# Patient Record
Sex: Male | Born: 1935 | Race: White | Hispanic: No | Marital: Married | Smoking: Former smoker
Health system: Southern US, Community
[De-identification: ages and names within clinical notes are randomized; demographics above are authoritative.]

## PROBLEM LIST (undated history)

## (undated) DIAGNOSIS — R0602 Shortness of breath: Secondary | ICD-10-CM

## (undated) DIAGNOSIS — J309 Allergic rhinitis, unspecified: Secondary | ICD-10-CM

## (undated) DIAGNOSIS — F068 Other specified mental disorders due to known physiological condition: Secondary | ICD-10-CM

## (undated) DIAGNOSIS — K4090 Unilateral inguinal hernia, without obstruction or gangrene, not specified as recurrent: Secondary | ICD-10-CM

## (undated) DIAGNOSIS — I10 Essential (primary) hypertension: Secondary | ICD-10-CM

## (undated) DIAGNOSIS — R5381 Other malaise: Secondary | ICD-10-CM

## (undated) DIAGNOSIS — Z9889 Other specified postprocedural states: Secondary | ICD-10-CM

## (undated) DIAGNOSIS — G4733 Obstructive sleep apnea (adult) (pediatric): Secondary | ICD-10-CM

## (undated) DIAGNOSIS — E7800 Pure hypercholesterolemia, unspecified: Secondary | ICD-10-CM

## (undated) DIAGNOSIS — R222 Localized swelling, mass and lump, trunk: Secondary | ICD-10-CM

## (undated) DIAGNOSIS — J449 Chronic obstructive pulmonary disease, unspecified: Secondary | ICD-10-CM

## (undated) DIAGNOSIS — I2510 Atherosclerotic heart disease of native coronary artery without angina pectoris: Secondary | ICD-10-CM

## (undated) DIAGNOSIS — J9610 Chronic respiratory failure, unspecified whether with hypoxia or hypercapnia: Secondary | ICD-10-CM

## (undated) DIAGNOSIS — R5383 Other fatigue: Secondary | ICD-10-CM

## (undated) DIAGNOSIS — L988 Other specified disorders of the skin and subcutaneous tissue: Secondary | ICD-10-CM

## (undated) DIAGNOSIS — R001 Bradycardia, unspecified: Secondary | ICD-10-CM

## (undated) DIAGNOSIS — I868 Varicose veins of other specified sites: Secondary | ICD-10-CM

## (undated) HISTORY — DX: Bradycardia, unspecified: R00.1

## (undated) HISTORY — DX: Chronic respiratory failure, unspecified whether with hypoxia or hypercapnia: J96.10

## (undated) HISTORY — DX: Localized swelling, mass and lump, trunk: R22.2

## (undated) HISTORY — DX: Atherosclerotic heart disease of native coronary artery without angina pectoris: I25.10

## (undated) HISTORY — DX: Other specified postprocedural states: Z98.890

## (undated) HISTORY — DX: Varicose veins of other specified sites: I86.8

## (undated) HISTORY — DX: Obstructive sleep apnea (adult) (pediatric): G47.33

## (undated) HISTORY — DX: Chronic obstructive pulmonary disease, unspecified: J44.9

## (undated) HISTORY — PX: CHOLECYSTECTOMY: SHX55

## (undated) HISTORY — DX: Unilateral inguinal hernia, without obstruction or gangrene, not specified as recurrent: K40.90

## (undated) HISTORY — DX: Essential (primary) hypertension: I10

## (undated) HISTORY — DX: Other specified disorders of the skin and subcutaneous tissue: L98.8

## (undated) HISTORY — DX: Other malaise: R53.81

## (undated) HISTORY — DX: Allergic rhinitis, unspecified: J30.9

## (undated) HISTORY — DX: Other fatigue: R53.83

## (undated) HISTORY — DX: Other specified mental disorders due to known physiological condition: F06.8

## (undated) HISTORY — DX: Shortness of breath: R06.02

## (undated) HISTORY — DX: Pure hypercholesterolemia, unspecified: E78.00

---

## 1998-04-14 HISTORY — PX: HERNIA REPAIR: SHX51

## 1998-05-01 ENCOUNTER — Ambulatory Visit (HOSPITAL_BASED_OUTPATIENT_CLINIC_OR_DEPARTMENT_OTHER): Admission: RE | Admit: 1998-05-01 | Discharge: 1998-05-01 | Payer: Self-pay | Admitting: Surgery

## 1999-07-14 ENCOUNTER — Encounter: Payer: Self-pay | Admitting: Family Medicine

## 1999-07-14 LAB — CONVERTED CEMR LAB: PSA: 0.9 ng/mL

## 1999-12-14 ENCOUNTER — Encounter: Payer: Self-pay | Admitting: Family Medicine

## 1999-12-14 LAB — CONVERTED CEMR LAB: Hgb A1c MFr Bld: 9.9 %

## 2000-11-12 ENCOUNTER — Encounter: Payer: Self-pay | Admitting: Family Medicine

## 2000-11-12 LAB — CONVERTED CEMR LAB: Hgb A1c MFr Bld: 5 %

## 2001-04-14 ENCOUNTER — Encounter: Payer: Self-pay | Admitting: Family Medicine

## 2001-04-14 LAB — CONVERTED CEMR LAB
Hgb A1c MFr Bld: 5.2 %
Microalbumin U total vol: 3 mg/L
PSA: 0.5 ng/mL

## 2002-05-15 ENCOUNTER — Encounter: Payer: Self-pay | Admitting: Family Medicine

## 2002-05-15 LAB — CONVERTED CEMR LAB
Hgb A1c MFr Bld: 5.4 %
Microalbumin U total vol: 7.3 mg/L
PSA: 0.7 ng/mL

## 2002-11-13 ENCOUNTER — Encounter: Payer: Self-pay | Admitting: Family Medicine

## 2002-11-13 LAB — CONVERTED CEMR LAB: Hgb A1c MFr Bld: 5.7 %

## 2003-01-26 ENCOUNTER — Inpatient Hospital Stay (HOSPITAL_COMMUNITY): Admission: EM | Admit: 2003-01-26 | Discharge: 2003-01-31 | Payer: Self-pay | Admitting: Emergency Medicine

## 2003-01-26 ENCOUNTER — Encounter: Payer: Self-pay | Admitting: Emergency Medicine

## 2003-01-27 ENCOUNTER — Encounter (INDEPENDENT_AMBULATORY_CARE_PROVIDER_SITE_OTHER): Payer: Self-pay | Admitting: *Deleted

## 2003-01-27 ENCOUNTER — Encounter: Payer: Self-pay | Admitting: *Deleted

## 2003-01-27 ENCOUNTER — Encounter: Payer: Self-pay | Admitting: General Surgery

## 2003-05-14 ENCOUNTER — Ambulatory Visit: Payer: Self-pay | Admitting: Cardiology

## 2003-05-16 ENCOUNTER — Encounter: Payer: Self-pay | Admitting: Family Medicine

## 2003-05-16 LAB — CONVERTED CEMR LAB
Hgb A1c MFr Bld: 5 %
Microalbumin U total vol: 5.1 mg/L
PSA: 0.4 ng/mL

## 2003-11-13 ENCOUNTER — Encounter: Payer: Self-pay | Admitting: Family Medicine

## 2003-11-13 LAB — CONVERTED CEMR LAB: Hgb A1c MFr Bld: 5.9 %

## 2004-02-27 ENCOUNTER — Encounter: Admission: RE | Admit: 2004-02-27 | Discharge: 2004-02-27 | Payer: Self-pay | Admitting: Orthopedic Surgery

## 2004-03-11 ENCOUNTER — Ambulatory Visit: Payer: Self-pay | Admitting: *Deleted

## 2004-03-12 ENCOUNTER — Encounter: Admission: RE | Admit: 2004-03-12 | Discharge: 2004-03-12 | Payer: Self-pay | Admitting: Orthopedic Surgery

## 2004-03-25 ENCOUNTER — Ambulatory Visit: Payer: Self-pay

## 2004-03-25 HISTORY — PX: OTHER SURGICAL HISTORY: SHX169

## 2004-04-02 ENCOUNTER — Ambulatory Visit: Payer: Self-pay | Admitting: Internal Medicine

## 2004-04-04 ENCOUNTER — Ambulatory Visit: Payer: Self-pay | Admitting: Internal Medicine

## 2004-04-09 ENCOUNTER — Ambulatory Visit (HOSPITAL_COMMUNITY): Admission: RE | Admit: 2004-04-09 | Discharge: 2004-04-10 | Payer: Self-pay | Admitting: Cardiology

## 2004-04-09 ENCOUNTER — Ambulatory Visit: Payer: Self-pay | Admitting: Cardiology

## 2004-04-14 ENCOUNTER — Encounter: Payer: Self-pay | Admitting: Family Medicine

## 2004-04-14 LAB — CONVERTED CEMR LAB
Hgb A1c MFr Bld: 5.7 %
Microalbumin U total vol: 5.6 mg/L
PSA: 0.61 ng/mL

## 2004-04-29 ENCOUNTER — Ambulatory Visit: Payer: Self-pay | Admitting: Family Medicine

## 2004-05-01 ENCOUNTER — Ambulatory Visit: Payer: Self-pay | Admitting: Family Medicine

## 2004-05-03 ENCOUNTER — Ambulatory Visit: Payer: Self-pay | Admitting: Internal Medicine

## 2004-05-15 ENCOUNTER — Ambulatory Visit: Payer: Self-pay | Admitting: Family Medicine

## 2004-05-15 HISTORY — PX: TOTAL HIP ARTHROPLASTY: SHX124

## 2004-05-20 ENCOUNTER — Inpatient Hospital Stay (HOSPITAL_COMMUNITY): Admission: RE | Admit: 2004-05-20 | Discharge: 2004-05-23 | Payer: Self-pay | Admitting: Orthopedic Surgery

## 2004-05-20 ENCOUNTER — Ambulatory Visit: Payer: Self-pay | Admitting: Physical Medicine & Rehabilitation

## 2004-05-20 ENCOUNTER — Ambulatory Visit: Payer: Self-pay | Admitting: Cardiology

## 2004-05-23 ENCOUNTER — Inpatient Hospital Stay
Admission: RE | Admit: 2004-05-23 | Discharge: 2004-05-29 | Payer: Self-pay | Admitting: Physical Medicine & Rehabilitation

## 2004-06-03 ENCOUNTER — Ambulatory Visit: Payer: Self-pay | Admitting: Internal Medicine

## 2004-06-03 ENCOUNTER — Inpatient Hospital Stay (HOSPITAL_COMMUNITY): Admission: EM | Admit: 2004-06-03 | Discharge: 2004-06-06 | Payer: Self-pay | Admitting: *Deleted

## 2004-06-03 DIAGNOSIS — Z9889 Other specified postprocedural states: Secondary | ICD-10-CM

## 2004-06-03 HISTORY — DX: Other specified postprocedural states: Z98.890

## 2004-06-11 ENCOUNTER — Ambulatory Visit: Payer: Self-pay | Admitting: Family Medicine

## 2004-06-18 ENCOUNTER — Ambulatory Visit: Payer: Self-pay | Admitting: Family Medicine

## 2004-06-25 ENCOUNTER — Ambulatory Visit: Payer: Self-pay | Admitting: Internal Medicine

## 2004-09-16 ENCOUNTER — Ambulatory Visit: Payer: Self-pay | Admitting: Internal Medicine

## 2004-09-24 ENCOUNTER — Ambulatory Visit: Payer: Self-pay | Admitting: Internal Medicine

## 2004-11-11 ENCOUNTER — Ambulatory Visit: Payer: Self-pay | Admitting: Family Medicine

## 2005-01-08 ENCOUNTER — Ambulatory Visit: Payer: Self-pay | Admitting: Family Medicine

## 2005-01-14 ENCOUNTER — Ambulatory Visit: Payer: Self-pay | Admitting: Internal Medicine

## 2005-01-28 ENCOUNTER — Ambulatory Visit: Payer: Self-pay | Admitting: Family Medicine

## 2005-04-14 ENCOUNTER — Encounter: Payer: Self-pay | Admitting: Family Medicine

## 2005-04-14 LAB — CONVERTED CEMR LAB: Hgb A1c MFr Bld: 6 %

## 2005-05-08 ENCOUNTER — Ambulatory Visit: Payer: Self-pay | Admitting: Family Medicine

## 2005-05-12 ENCOUNTER — Ambulatory Visit: Payer: Self-pay | Admitting: Family Medicine

## 2005-05-26 ENCOUNTER — Inpatient Hospital Stay (HOSPITAL_COMMUNITY): Admission: RE | Admit: 2005-05-26 | Discharge: 2005-05-30 | Payer: Self-pay | Admitting: Orthopedic Surgery

## 2005-05-26 ENCOUNTER — Ambulatory Visit: Payer: Self-pay | Admitting: Cardiology

## 2005-05-26 ENCOUNTER — Ambulatory Visit: Payer: Self-pay | Admitting: Physical Medicine & Rehabilitation

## 2005-05-28 ENCOUNTER — Ambulatory Visit: Payer: Self-pay | Admitting: Family Medicine

## 2005-05-30 ENCOUNTER — Inpatient Hospital Stay (HOSPITAL_COMMUNITY)
Admission: RE | Admit: 2005-05-30 | Discharge: 2005-06-06 | Payer: Self-pay | Admitting: Physical Medicine & Rehabilitation

## 2005-05-31 HISTORY — PX: TOTAL HIP ARTHROPLASTY: SHX124

## 2005-07-25 ENCOUNTER — Ambulatory Visit: Payer: Self-pay | Admitting: Family Medicine

## 2005-08-05 ENCOUNTER — Ambulatory Visit: Payer: Self-pay | Admitting: Internal Medicine

## 2005-08-06 ENCOUNTER — Encounter: Payer: Self-pay | Admitting: Internal Medicine

## 2005-08-06 ENCOUNTER — Ambulatory Visit: Admission: RE | Admit: 2005-08-06 | Discharge: 2005-08-06 | Payer: Self-pay | Admitting: Internal Medicine

## 2005-08-13 ENCOUNTER — Ambulatory Visit: Payer: Self-pay | Admitting: Internal Medicine

## 2005-08-13 ENCOUNTER — Ambulatory Visit: Payer: Self-pay

## 2005-08-13 HISTORY — PX: OTHER SURGICAL HISTORY: SHX169

## 2005-10-09 ENCOUNTER — Ambulatory Visit: Payer: Self-pay | Admitting: Internal Medicine

## 2005-12-03 ENCOUNTER — Ambulatory Visit: Payer: Self-pay | Admitting: Family Medicine

## 2006-01-13 ENCOUNTER — Ambulatory Visit: Payer: Self-pay | Admitting: Family Medicine

## 2006-03-20 ENCOUNTER — Ambulatory Visit: Payer: Self-pay | Admitting: Family Medicine

## 2006-03-23 ENCOUNTER — Ambulatory Visit: Payer: Self-pay | Admitting: Family Medicine

## 2006-04-29 ENCOUNTER — Ambulatory Visit: Payer: Self-pay | Admitting: Family Medicine

## 2006-04-29 LAB — CONVERTED CEMR LAB
ALT: 16 units/L (ref 0–40)
AST: 19 units/L (ref 0–37)
Albumin: 3.8 g/dL (ref 3.5–5.2)
Alkaline Phosphatase: 88 units/L (ref 39–117)
BUN: 12 mg/dL (ref 6–23)
CO2: 29 meq/L (ref 19–32)
Calcium: 9.3 mg/dL (ref 8.4–10.5)
Chloride: 109 meq/L (ref 96–112)
Cholesterol: 117 mg/dL (ref 0–200)
Creatinine, Ser: 1.2 mg/dL (ref 0.4–1.5)
Creatinine,U: 125.9 mg/dL
GFR calc Af Amer: 75 mL/min
GFR calc non Af Amer: 62 mL/min
Glucose, Bld: 125 mg/dL — ABNORMAL HIGH (ref 70–99)
HDL: 40.5 mg/dL (ref >39.0)
Hgb A1c MFr Bld: 6.3 %
Hgb A1c MFr Bld: 6.3 % — ABNORMAL HIGH (ref 4.6–6.0)
LDL Cholesterol: 59 mg/dL (ref 0–99)
Microalb Creat Ratio: 5.6 mg/g (ref 0.0–30.0)
Microalb, Ur: 0.7 mg/dL (ref 0.0–1.9)
Microalbumin U total vol: 5.6 mg/L
PSA: 0.75 ng/mL
PSA: 0.75 ng/mL (ref 0.10–4.00)
Potassium: 4.4 meq/L (ref 3.5–5.1)
Sodium: 143 meq/L (ref 135–145)
TSH: 2.22 microintl units/mL (ref 0.35–5.50)
Total Bilirubin: 0.9 mg/dL (ref 0.3–1.2)
Total CHOL/HDL Ratio: 2.9
Total Protein: 6.6 g/dL (ref 6.0–8.3)
Triglycerides: 87 mg/dL (ref 0–149)
VLDL: 17 mg/dL (ref 0–40)

## 2006-05-01 ENCOUNTER — Ambulatory Visit: Payer: Self-pay | Admitting: Family Medicine

## 2006-05-14 ENCOUNTER — Ambulatory Visit: Payer: Self-pay | Admitting: Family Medicine

## 2006-08-03 ENCOUNTER — Ambulatory Visit: Payer: Self-pay | Admitting: Family Medicine

## 2006-08-03 LAB — CONVERTED CEMR LAB
ALT: 16 units/L (ref 0–40)
AST: 22 units/L (ref 0–37)
Albumin: 3.9 g/dL (ref 3.5–5.2)
Alkaline Phosphatase: 85 units/L (ref 39–117)
BUN: 19 mg/dL (ref 6–23)
Bilirubin, Direct: 0.1 mg/dL (ref 0.0–0.3)
CO2: 28 meq/L (ref 19–32)
Calcium: 9.2 mg/dL (ref 8.4–10.5)
Chloride: 108 meq/L (ref 96–112)
Cholesterol: 127 mg/dL (ref 0–200)
Creatinine, Ser: 1.1 mg/dL (ref 0.4–1.5)
GFR calc Af Amer: 83 mL/min
GFR calc non Af Amer: 69 mL/min
Glucose, Bld: 133 mg/dL — ABNORMAL HIGH (ref 70–99)
HDL: 45.6 mg/dL (ref >39.0)
LDL Cholesterol: 65 mg/dL (ref 0–99)
Potassium: 5.1 meq/L (ref 3.5–5.1)
Sodium: 142 meq/L (ref 135–145)
Total Bilirubin: 0.7 mg/dL (ref 0.3–1.2)
Total CHOL/HDL Ratio: 2.8
Total Protein: 6.7 g/dL (ref 6.0–8.3)
Triglycerides: 84 mg/dL (ref 0–149)
VLDL: 17 mg/dL (ref 0–40)

## 2006-08-12 ENCOUNTER — Ambulatory Visit: Payer: Self-pay

## 2006-08-12 ENCOUNTER — Ambulatory Visit: Payer: Self-pay | Admitting: Internal Medicine

## 2006-09-02 ENCOUNTER — Ambulatory Visit: Payer: Self-pay

## 2006-09-09 ENCOUNTER — Ambulatory Visit: Payer: Self-pay | Admitting: Pulmonary Disease

## 2006-09-09 ENCOUNTER — Ambulatory Visit (HOSPITAL_BASED_OUTPATIENT_CLINIC_OR_DEPARTMENT_OTHER): Admission: RE | Admit: 2006-09-09 | Discharge: 2006-09-09 | Payer: Self-pay | Admitting: Pulmonary Disease

## 2006-09-11 ENCOUNTER — Ambulatory Visit: Payer: Self-pay | Admitting: Family Medicine

## 2006-09-22 ENCOUNTER — Ambulatory Visit: Payer: Self-pay | Admitting: Pulmonary Disease

## 2006-10-07 ENCOUNTER — Ambulatory Visit: Payer: Self-pay | Admitting: Pulmonary Disease

## 2006-10-07 ENCOUNTER — Telehealth (INDEPENDENT_AMBULATORY_CARE_PROVIDER_SITE_OTHER): Payer: Self-pay | Admitting: *Deleted

## 2006-10-08 ENCOUNTER — Ambulatory Visit: Payer: Self-pay | Admitting: Family Medicine

## 2006-10-12 ENCOUNTER — Ambulatory Visit: Payer: Self-pay | Admitting: Internal Medicine

## 2006-10-22 ENCOUNTER — Encounter: Payer: Self-pay | Admitting: Family Medicine

## 2006-10-22 DIAGNOSIS — I10 Essential (primary) hypertension: Secondary | ICD-10-CM | POA: Insufficient documentation

## 2006-10-22 DIAGNOSIS — K649 Unspecified hemorrhoids: Secondary | ICD-10-CM | POA: Insufficient documentation

## 2006-10-22 DIAGNOSIS — E119 Type 2 diabetes mellitus without complications: Secondary | ICD-10-CM | POA: Insufficient documentation

## 2006-10-22 DIAGNOSIS — I2510 Atherosclerotic heart disease of native coronary artery without angina pectoris: Secondary | ICD-10-CM | POA: Insufficient documentation

## 2006-10-26 ENCOUNTER — Ambulatory Visit: Payer: Self-pay | Admitting: Family Medicine

## 2006-10-28 ENCOUNTER — Telehealth (INDEPENDENT_AMBULATORY_CARE_PROVIDER_SITE_OTHER): Payer: Self-pay | Admitting: *Deleted

## 2006-10-28 ENCOUNTER — Ambulatory Visit: Payer: Self-pay | Admitting: Family Medicine

## 2006-11-04 ENCOUNTER — Ambulatory Visit: Payer: Self-pay | Admitting: Pulmonary Disease

## 2006-11-04 ENCOUNTER — Encounter: Payer: Self-pay | Admitting: Pulmonary Disease

## 2007-01-11 ENCOUNTER — Ambulatory Visit: Payer: Self-pay | Admitting: Pulmonary Disease

## 2007-01-12 ENCOUNTER — Ambulatory Visit: Payer: Self-pay | Admitting: Internal Medicine

## 2007-01-13 ENCOUNTER — Ambulatory Visit: Payer: Self-pay | Admitting: Family Medicine

## 2007-02-02 ENCOUNTER — Ambulatory Visit: Payer: Self-pay | Admitting: Family Medicine

## 2007-02-09 ENCOUNTER — Telehealth (INDEPENDENT_AMBULATORY_CARE_PROVIDER_SITE_OTHER): Payer: Self-pay | Admitting: *Deleted

## 2007-03-01 ENCOUNTER — Ambulatory Visit: Payer: Self-pay | Admitting: Pulmonary Disease

## 2007-03-30 ENCOUNTER — Telehealth (INDEPENDENT_AMBULATORY_CARE_PROVIDER_SITE_OTHER): Payer: Self-pay | Admitting: *Deleted

## 2007-04-12 ENCOUNTER — Telehealth (INDEPENDENT_AMBULATORY_CARE_PROVIDER_SITE_OTHER): Payer: Self-pay | Admitting: *Deleted

## 2007-04-27 ENCOUNTER — Ambulatory Visit: Payer: Self-pay | Admitting: Family Medicine

## 2007-04-27 LAB — CONVERTED CEMR LAB
ALT: 17 units/L (ref 0–53)
AST: 20 units/L (ref 0–37)
Albumin: 3.9 g/dL (ref 3.5–5.2)
Alkaline Phosphatase: 86 units/L (ref 39–117)
BUN: 10 mg/dL (ref 6–23)
Bilirubin, Direct: 0.1 mg/dL (ref 0.0–0.3)
CO2: 30 meq/L (ref 19–32)
Calcium: 9.3 mg/dL (ref 8.4–10.5)
Chloride: 106 meq/L (ref 96–112)
Cholesterol: 128 mg/dL (ref 0–200)
Creatinine, Ser: 1.2 mg/dL (ref 0.4–1.5)
Creatinine,U: 106.8 mg/dL
GFR calc Af Amer: 75 mL/min
GFR calc non Af Amer: 62 mL/min
Glucose, Bld: 136 mg/dL — ABNORMAL HIGH (ref 70–99)
HDL: 38.3 mg/dL — ABNORMAL LOW (ref >39.0)
Hgb A1c MFr Bld: 6.5 % — ABNORMAL HIGH (ref 4.6–6.0)
LDL Cholesterol: 65 mg/dL (ref 0–99)
Microalb Creat Ratio: 7.5 mg/g (ref 0.0–30.0)
Microalb, Ur: 0.8 mg/dL (ref 0.0–1.9)
Potassium: 4.6 meq/L (ref 3.5–5.1)
Sodium: 143 meq/L (ref 135–145)
TSH: 3.04 microintl units/mL (ref 0.35–5.50)
Total Bilirubin: 0.9 mg/dL (ref 0.3–1.2)
Total CHOL/HDL Ratio: 3.3
Total Protein: 7.3 g/dL (ref 6.0–8.3)
Triglycerides: 125 mg/dL (ref 0–149)
VLDL: 25 mg/dL (ref 0–40)

## 2007-04-29 ENCOUNTER — Ambulatory Visit: Payer: Self-pay | Admitting: Family Medicine

## 2007-04-29 DIAGNOSIS — K4090 Unilateral inguinal hernia, without obstruction or gangrene, not specified as recurrent: Secondary | ICD-10-CM | POA: Insufficient documentation

## 2007-05-18 ENCOUNTER — Ambulatory Visit: Payer: Self-pay | Admitting: Family Medicine

## 2007-05-19 ENCOUNTER — Encounter (INDEPENDENT_AMBULATORY_CARE_PROVIDER_SITE_OTHER): Payer: Self-pay | Admitting: *Deleted

## 2007-06-10 ENCOUNTER — Ambulatory Visit: Payer: Self-pay | Admitting: Family Medicine

## 2007-06-11 ENCOUNTER — Telehealth: Payer: Self-pay | Admitting: Family Medicine

## 2007-06-29 ENCOUNTER — Ambulatory Visit: Payer: Self-pay | Admitting: Pulmonary Disease

## 2007-07-08 ENCOUNTER — Telehealth (INDEPENDENT_AMBULATORY_CARE_PROVIDER_SITE_OTHER): Payer: Self-pay | Admitting: *Deleted

## 2007-07-12 DIAGNOSIS — G4733 Obstructive sleep apnea (adult) (pediatric): Secondary | ICD-10-CM

## 2007-08-24 ENCOUNTER — Ambulatory Visit: Payer: Self-pay | Admitting: Internal Medicine

## 2007-08-31 ENCOUNTER — Encounter: Payer: Self-pay | Admitting: Family Medicine

## 2007-09-08 ENCOUNTER — Ambulatory Visit: Payer: Self-pay | Admitting: Family Medicine

## 2007-09-15 ENCOUNTER — Encounter: Payer: Self-pay | Admitting: Internal Medicine

## 2007-09-15 ENCOUNTER — Ambulatory Visit: Payer: Self-pay | Admitting: Internal Medicine

## 2007-09-16 ENCOUNTER — Ambulatory Visit: Payer: Self-pay | Admitting: Internal Medicine

## 2007-10-18 ENCOUNTER — Ambulatory Visit: Payer: Self-pay | Admitting: Family Medicine

## 2007-11-25 ENCOUNTER — Ambulatory Visit: Payer: Self-pay | Admitting: Family Medicine

## 2007-11-25 DIAGNOSIS — E7800 Pure hypercholesterolemia, unspecified: Secondary | ICD-10-CM

## 2007-11-27 LAB — CONVERTED CEMR LAB
BUN: 14 mg/dL (ref 6–23)
CO2: 32 meq/L (ref 19–32)
Calcium: 9.3 mg/dL (ref 8.4–10.5)
Chloride: 108 meq/L (ref 96–112)
Cholesterol: 113 mg/dL (ref 0–200)
Creatinine, Ser: 1.2 mg/dL (ref 0.4–1.5)
GFR calc Af Amer: 75 mL/min
GFR calc non Af Amer: 62 mL/min
Glucose, Bld: 86 mg/dL (ref 70–99)
HDL: 35.1 mg/dL — ABNORMAL LOW (ref >39.0)
Hgb A1c MFr Bld: 6.5 % — ABNORMAL HIGH (ref 4.6–6.0)
LDL Cholesterol: 62 mg/dL (ref 0–99)
Potassium: 4.5 meq/L (ref 3.5–5.1)
Sodium: 143 meq/L (ref 135–145)
Total CHOL/HDL Ratio: 3.2
Triglycerides: 81 mg/dL (ref 0–149)
VLDL: 16 mg/dL (ref 0–40)

## 2007-11-29 ENCOUNTER — Ambulatory Visit: Payer: Self-pay | Admitting: Family Medicine

## 2007-11-29 ENCOUNTER — Encounter: Payer: Self-pay | Admitting: Pulmonary Disease

## 2008-01-02 ENCOUNTER — Encounter: Payer: Self-pay | Admitting: Pulmonary Disease

## 2008-01-11 ENCOUNTER — Ambulatory Visit: Payer: Self-pay | Admitting: Family Medicine

## 2008-01-13 ENCOUNTER — Ambulatory Visit: Payer: Self-pay | Admitting: Pulmonary Disease

## 2008-01-18 ENCOUNTER — Ambulatory Visit: Payer: Self-pay | Admitting: Internal Medicine

## 2008-02-05 ENCOUNTER — Encounter: Payer: Self-pay | Admitting: Pulmonary Disease

## 2008-02-16 ENCOUNTER — Telehealth (INDEPENDENT_AMBULATORY_CARE_PROVIDER_SITE_OTHER): Payer: Self-pay | Admitting: *Deleted

## 2008-02-18 ENCOUNTER — Ambulatory Visit: Payer: Self-pay | Admitting: Pulmonary Disease

## 2008-03-18 ENCOUNTER — Encounter: Payer: Self-pay | Admitting: Pulmonary Disease

## 2008-04-25 ENCOUNTER — Ambulatory Visit: Payer: Self-pay | Admitting: Family Medicine

## 2008-04-25 LAB — CONVERTED CEMR LAB
ALT: 17 units/L (ref 0–53)
AST: 19 units/L (ref 0–37)
Albumin: 4.1 g/dL (ref 3.5–5.2)
Alkaline Phosphatase: 93 units/L (ref 39–117)
BUN: 22 mg/dL (ref 6–23)
Basophils Absolute: 0 10*3/uL (ref 0.0–0.1)
Basophils Relative: 0.1 % (ref 0.0–3.0)
Bilirubin, Direct: 0.1 mg/dL (ref 0.0–0.3)
CO2: 30 meq/L (ref 19–32)
Calcium: 9.6 mg/dL (ref 8.4–10.5)
Chloride: 103 meq/L (ref 96–112)
Cholesterol: 157 mg/dL (ref 0–200)
Creatinine, Ser: 1.3 mg/dL (ref 0.4–1.5)
Creatinine,U: 78.6 mg/dL
Eosinophils Absolute: 0.5 10*3/uL (ref 0.0–0.7)
Eosinophils Relative: 4.6 % (ref 0.0–5.0)
GFR calc Af Amer: 68 mL/min
GFR calc non Af Amer: 56 mL/min
Glucose, Bld: 147 mg/dL — ABNORMAL HIGH (ref 70–99)
HCT: 47.4 % (ref 39.0–52.0)
HDL: 48.7 mg/dL (ref >39.0)
Hemoglobin: 16.3 g/dL (ref 13.0–17.0)
Hgb A1c MFr Bld: 6.4 % — ABNORMAL HIGH (ref 4.6–6.0)
LDL Cholesterol: 84 mg/dL (ref 0–99)
Lymphocytes Relative: 12.5 % (ref 12.0–46.0)
MCHC: 34.4 g/dL (ref 30.0–36.0)
MCV: 95.2 fL (ref 78.0–100.0)
Microalb Creat Ratio: 6.4 mg/g (ref 0.0–30.0)
Microalb, Ur: 0.5 mg/dL (ref 0.0–1.9)
Monocytes Absolute: 1 10*3/uL (ref 0.1–1.0)
Monocytes Relative: 8.8 % (ref 3.0–12.0)
Neutro Abs: 8 10*3/uL — ABNORMAL HIGH (ref 1.4–7.7)
Neutrophils Relative %: 74 % (ref 43.0–77.0)
Platelets: 181 10*3/uL (ref 150–400)
Potassium: 4.5 meq/L (ref 3.5–5.1)
RBC: 4.97 M/uL (ref 4.22–5.81)
RDW: 12.6 % (ref 11.5–14.6)
Sodium: 138 meq/L (ref 135–145)
TSH: 2.47 microintl units/mL (ref 0.35–5.50)
Total Bilirubin: 0.7 mg/dL (ref 0.3–1.2)
Total CHOL/HDL Ratio: 3.2
Total Protein: 7.6 g/dL (ref 6.0–8.3)
Triglycerides: 120 mg/dL (ref 0–149)
VLDL: 24 mg/dL (ref 0–40)
WBC: 10.9 10*3/uL — ABNORMAL HIGH (ref 4.5–10.5)

## 2008-05-01 ENCOUNTER — Ambulatory Visit: Payer: Self-pay | Admitting: Family Medicine

## 2008-05-18 ENCOUNTER — Telehealth: Payer: Self-pay | Admitting: Family Medicine

## 2008-05-26 ENCOUNTER — Ambulatory Visit: Payer: Self-pay | Admitting: Family Medicine

## 2008-05-26 LAB — CONVERTED CEMR LAB
OCCULT 1: NEGATIVE
OCCULT 2: NEGATIVE
OCCULT 3: NEGATIVE

## 2008-05-29 ENCOUNTER — Encounter (INDEPENDENT_AMBULATORY_CARE_PROVIDER_SITE_OTHER): Payer: Self-pay | Admitting: *Deleted

## 2008-05-29 ENCOUNTER — Encounter: Payer: Self-pay | Admitting: Family Medicine

## 2008-07-11 ENCOUNTER — Ambulatory Visit: Payer: Self-pay | Admitting: Pulmonary Disease

## 2008-07-11 DIAGNOSIS — J9610 Chronic respiratory failure, unspecified whether with hypoxia or hypercapnia: Secondary | ICD-10-CM

## 2008-07-26 ENCOUNTER — Ambulatory Visit: Payer: Self-pay | Admitting: Internal Medicine

## 2008-08-30 ENCOUNTER — Telehealth (INDEPENDENT_AMBULATORY_CARE_PROVIDER_SITE_OTHER): Payer: Self-pay | Admitting: *Deleted

## 2008-09-20 ENCOUNTER — Encounter: Payer: Self-pay | Admitting: Internal Medicine

## 2008-10-27 ENCOUNTER — Ambulatory Visit: Payer: Self-pay | Admitting: Family Medicine

## 2008-10-27 DIAGNOSIS — R5383 Other fatigue: Secondary | ICD-10-CM

## 2008-10-27 DIAGNOSIS — R5381 Other malaise: Secondary | ICD-10-CM

## 2008-10-27 DIAGNOSIS — R0602 Shortness of breath: Secondary | ICD-10-CM | POA: Insufficient documentation

## 2008-10-28 ENCOUNTER — Ambulatory Visit: Payer: Self-pay | Admitting: Internal Medicine

## 2008-10-28 ENCOUNTER — Inpatient Hospital Stay (HOSPITAL_COMMUNITY): Admission: EM | Admit: 2008-10-28 | Discharge: 2008-10-30 | Payer: Self-pay | Admitting: Emergency Medicine

## 2008-10-28 ENCOUNTER — Ambulatory Visit: Payer: Self-pay | Admitting: Pulmonary Disease

## 2008-10-28 HISTORY — PX: OTHER SURGICAL HISTORY: SHX169

## 2008-10-30 ENCOUNTER — Encounter: Payer: Self-pay | Admitting: Family Medicine

## 2008-10-30 ENCOUNTER — Encounter: Payer: Self-pay | Admitting: Pulmonary Disease

## 2008-11-02 ENCOUNTER — Ambulatory Visit (HOSPITAL_COMMUNITY): Admission: RE | Admit: 2008-11-02 | Discharge: 2008-11-02 | Payer: Self-pay | Admitting: Pulmonary Disease

## 2008-11-02 HISTORY — PX: OTHER SURGICAL HISTORY: SHX169

## 2008-11-29 ENCOUNTER — Ambulatory Visit: Payer: Self-pay | Admitting: Family Medicine

## 2008-11-29 DIAGNOSIS — F068 Other specified mental disorders due to known physiological condition: Secondary | ICD-10-CM

## 2008-11-30 ENCOUNTER — Ambulatory Visit: Payer: Self-pay | Admitting: Pulmonary Disease

## 2008-11-30 DIAGNOSIS — R930 Abnormal findings on diagnostic imaging of skull and head, not elsewhere classified: Secondary | ICD-10-CM | POA: Insufficient documentation

## 2009-01-04 ENCOUNTER — Ambulatory Visit: Payer: Self-pay | Admitting: Internal Medicine

## 2009-01-05 ENCOUNTER — Telehealth: Payer: Self-pay | Admitting: Family Medicine

## 2009-01-09 ENCOUNTER — Ambulatory Visit: Payer: Self-pay | Admitting: Family Medicine

## 2009-01-09 ENCOUNTER — Telehealth (INDEPENDENT_AMBULATORY_CARE_PROVIDER_SITE_OTHER): Payer: Self-pay | Admitting: *Deleted

## 2009-01-31 ENCOUNTER — Encounter: Payer: Self-pay | Admitting: Family Medicine

## 2009-01-31 DIAGNOSIS — I498 Other specified cardiac arrhythmias: Secondary | ICD-10-CM

## 2009-02-07 ENCOUNTER — Encounter: Payer: Self-pay | Admitting: Family Medicine

## 2009-02-08 ENCOUNTER — Telehealth: Payer: Self-pay | Admitting: Family Medicine

## 2009-02-16 ENCOUNTER — Telehealth (INDEPENDENT_AMBULATORY_CARE_PROVIDER_SITE_OTHER): Payer: Self-pay | Admitting: Internal Medicine

## 2009-03-15 ENCOUNTER — Telehealth: Payer: Self-pay | Admitting: Internal Medicine

## 2009-04-05 ENCOUNTER — Ambulatory Visit: Payer: Self-pay | Admitting: Pulmonary Disease

## 2009-05-02 ENCOUNTER — Telehealth: Payer: Self-pay | Admitting: Family Medicine

## 2009-05-03 ENCOUNTER — Encounter: Payer: Self-pay | Admitting: Pulmonary Disease

## 2009-05-09 ENCOUNTER — Ambulatory Visit: Payer: Self-pay | Admitting: Internal Medicine

## 2009-05-21 ENCOUNTER — Ambulatory Visit: Payer: Self-pay | Admitting: Family Medicine

## 2009-05-21 LAB — CONVERTED CEMR LAB
ALT: 22 units/L (ref 0–53)
AST: 23 units/L (ref 0–37)
Albumin: 4.3 g/dL (ref 3.5–5.2)
Alkaline Phosphatase: 82 units/L (ref 39–117)
BUN: 13 mg/dL (ref 6–23)
Basophils Absolute: 0 10*3/uL (ref 0.0–0.1)
Basophils Relative: 0.2 % (ref 0.0–3.0)
Bilirubin, Direct: 0.1 mg/dL (ref 0.0–0.3)
CO2: 33 meq/L — ABNORMAL HIGH (ref 19–32)
Calcium: 9.5 mg/dL (ref 8.4–10.5)
Chloride: 106 meq/L (ref 96–112)
Cholesterol: 126 mg/dL (ref 0–200)
Creatinine, Ser: 1.2 mg/dL (ref 0.4–1.5)
Creatinine,U: 189.2 mg/dL
Eosinophils Absolute: 0.4 10*3/uL (ref 0.0–0.7)
Eosinophils Relative: 5.7 % — ABNORMAL HIGH (ref 0.0–5.0)
GFR calc non Af Amer: 61.56 mL/min (ref >60)
Glucose, Bld: 107 mg/dL — ABNORMAL HIGH (ref 70–99)
HCT: 44.9 % (ref 39.0–52.0)
HDL: 54.4 mg/dL (ref >39.00)
Hemoglobin: 15 g/dL (ref 13.0–17.0)
Hgb A1c MFr Bld: 6.8 % — ABNORMAL HIGH (ref 4.6–6.5)
LDL Cholesterol: 56 mg/dL (ref 0–99)
Lymphocytes Relative: 25.6 % (ref 12.0–46.0)
Lymphs Abs: 1.9 10*3/uL (ref 0.7–4.0)
MCHC: 33.5 g/dL (ref 30.0–36.0)
MCV: 95.1 fL (ref 78.0–100.0)
Microalb Creat Ratio: 19 mg/g (ref 0.0–30.0)
Microalb, Ur: 3.6 mg/dL — ABNORMAL HIGH (ref 0.0–1.9)
Monocytes Absolute: 0.8 10*3/uL (ref 0.1–1.0)
Monocytes Relative: 10.3 % (ref 3.0–12.0)
Neutro Abs: 4.3 10*3/uL (ref 1.4–7.7)
Neutrophils Relative %: 58.2 % (ref 43.0–77.0)
Platelets: 180 10*3/uL (ref 150.0–400.0)
Potassium: 4.1 meq/L (ref 3.5–5.1)
RBC: 4.72 M/uL (ref 4.22–5.81)
RDW: 12.8 % (ref 11.5–14.6)
Sodium: 142 meq/L (ref 135–145)
TSH: 3 microintl units/mL (ref 0.35–5.50)
Total Bilirubin: 0.7 mg/dL (ref 0.3–1.2)
Total CHOL/HDL Ratio: 2
Total Protein: 7.5 g/dL (ref 6.0–8.3)
Triglycerides: 78 mg/dL (ref 0.0–149.0)
VLDL: 15.6 mg/dL (ref 0.0–40.0)
WBC: 7.4 10*3/uL (ref 4.5–10.5)

## 2009-05-22 ENCOUNTER — Ambulatory Visit: Payer: Self-pay | Admitting: Pulmonary Disease

## 2009-05-22 LAB — CONVERTED CEMR LAB: Vit D, 25-Hydroxy: 85 ng/mL (ref 30–89)

## 2009-05-23 ENCOUNTER — Ambulatory Visit: Payer: Self-pay | Admitting: Family Medicine

## 2009-06-15 ENCOUNTER — Ambulatory Visit: Payer: Self-pay | Admitting: Family Medicine

## 2009-06-15 LAB — CONVERTED CEMR LAB
OCCULT 1: NEGATIVE
OCCULT 2: NEGATIVE
OCCULT 3: NEGATIVE

## 2009-06-15 LAB — FECAL OCCULT BLOOD, GUAIAC: Fecal Occult Blood: NEGATIVE

## 2009-06-18 ENCOUNTER — Encounter (INDEPENDENT_AMBULATORY_CARE_PROVIDER_SITE_OTHER): Payer: Self-pay | Admitting: *Deleted

## 2009-06-27 ENCOUNTER — Telehealth: Payer: Self-pay | Admitting: Internal Medicine

## 2009-07-03 ENCOUNTER — Telehealth: Payer: Self-pay | Admitting: Family Medicine

## 2009-07-10 ENCOUNTER — Telehealth: Payer: Self-pay | Admitting: Family Medicine

## 2009-07-16 ENCOUNTER — Encounter: Payer: Self-pay | Admitting: Pulmonary Disease

## 2009-07-19 ENCOUNTER — Encounter: Payer: Self-pay | Admitting: Family Medicine

## 2009-07-30 ENCOUNTER — Encounter: Payer: Self-pay | Admitting: Family Medicine

## 2009-08-15 ENCOUNTER — Telehealth: Payer: Self-pay | Admitting: Pulmonary Disease

## 2009-08-19 ENCOUNTER — Emergency Department (HOSPITAL_COMMUNITY): Admission: EM | Admit: 2009-08-19 | Discharge: 2009-08-19 | Payer: Self-pay | Admitting: Emergency Medicine

## 2009-08-23 ENCOUNTER — Ambulatory Visit: Payer: Self-pay | Admitting: Pulmonary Disease

## 2009-08-23 ENCOUNTER — Encounter: Payer: Self-pay | Admitting: Family Medicine

## 2009-08-23 DIAGNOSIS — J449 Chronic obstructive pulmonary disease, unspecified: Secondary | ICD-10-CM

## 2009-08-24 ENCOUNTER — Ambulatory Visit: Payer: Self-pay | Admitting: Cardiovascular Disease

## 2009-08-24 DIAGNOSIS — E785 Hyperlipidemia, unspecified: Secondary | ICD-10-CM

## 2009-08-29 ENCOUNTER — Ambulatory Visit: Payer: Self-pay | Admitting: Family Medicine

## 2009-08-30 ENCOUNTER — Telehealth (INDEPENDENT_AMBULATORY_CARE_PROVIDER_SITE_OTHER): Payer: Self-pay | Admitting: *Deleted

## 2009-08-31 ENCOUNTER — Encounter: Payer: Self-pay | Admitting: Pulmonary Disease

## 2009-09-04 ENCOUNTER — Telehealth: Payer: Self-pay | Admitting: Pulmonary Disease

## 2009-09-05 ENCOUNTER — Telehealth (INDEPENDENT_AMBULATORY_CARE_PROVIDER_SITE_OTHER): Payer: Self-pay | Admitting: *Deleted

## 2009-09-05 ENCOUNTER — Telehealth: Payer: Self-pay | Admitting: Family Medicine

## 2009-09-06 ENCOUNTER — Ambulatory Visit: Payer: Self-pay | Admitting: Pulmonary Disease

## 2009-09-06 DIAGNOSIS — J441 Chronic obstructive pulmonary disease with (acute) exacerbation: Secondary | ICD-10-CM

## 2009-09-11 ENCOUNTER — Telehealth: Payer: Self-pay | Admitting: Adult Health

## 2009-09-12 ENCOUNTER — Encounter: Payer: Self-pay | Admitting: Pulmonary Disease

## 2009-09-24 ENCOUNTER — Telehealth (INDEPENDENT_AMBULATORY_CARE_PROVIDER_SITE_OTHER): Payer: Self-pay | Admitting: *Deleted

## 2009-09-25 ENCOUNTER — Ambulatory Visit: Payer: Self-pay | Admitting: Pulmonary Disease

## 2009-10-01 ENCOUNTER — Telehealth: Payer: Self-pay | Admitting: Family Medicine

## 2009-10-03 ENCOUNTER — Ambulatory Visit: Payer: Self-pay | Admitting: Family Medicine

## 2009-10-03 LAB — CONVERTED CEMR LAB
BUN: 22 mg/dL (ref 6–23)
CO2: 32 meq/L (ref 19–32)
Calcium: 9.2 mg/dL (ref 8.4–10.5)
Chloride: 107 meq/L (ref 96–112)
Creatinine, Ser: 1.1 mg/dL (ref 0.4–1.5)
GFR calc non Af Amer: 69.46 mL/min (ref >60)
Glucose, Bld: 139 mg/dL — ABNORMAL HIGH (ref 70–99)
Potassium: 4.2 meq/L (ref 3.5–5.1)
Sodium: 145 meq/L (ref 135–145)

## 2009-10-08 ENCOUNTER — Encounter: Payer: Self-pay | Admitting: Family Medicine

## 2009-10-11 ENCOUNTER — Ambulatory Visit: Payer: Self-pay | Admitting: Family Medicine

## 2009-11-01 ENCOUNTER — Ambulatory Visit: Payer: Self-pay | Admitting: Cardiovascular Disease

## 2009-11-19 ENCOUNTER — Encounter (INDEPENDENT_AMBULATORY_CARE_PROVIDER_SITE_OTHER): Payer: Self-pay | Admitting: *Deleted

## 2009-12-12 ENCOUNTER — Ambulatory Visit: Payer: Self-pay | Admitting: Pulmonary Disease

## 2009-12-12 DIAGNOSIS — J309 Allergic rhinitis, unspecified: Secondary | ICD-10-CM | POA: Insufficient documentation

## 2009-12-13 ENCOUNTER — Ambulatory Visit: Payer: Self-pay | Admitting: Cardiovascular Disease

## 2009-12-14 ENCOUNTER — Encounter: Payer: Self-pay | Admitting: Cardiovascular Disease

## 2009-12-20 LAB — CONVERTED CEMR LAB
ALT: 14 units/L (ref 0–53)
AST: 20 units/L (ref 0–37)
Albumin: 4.1 g/dL (ref 3.5–5.2)
Alkaline Phosphatase: 75 units/L (ref 39–117)
Bilirubin, Direct: 0.1 mg/dL (ref 0.0–0.3)
Cholesterol: 136 mg/dL (ref 0–200)
HDL: 51 mg/dL (ref >39)
Indirect Bilirubin: 0.4 mg/dL (ref 0.0–0.9)
LDL Cholesterol: 65 mg/dL (ref 0–99)
Total Bilirubin: 0.5 mg/dL (ref 0.3–1.2)
Total CHOL/HDL Ratio: 2.7
Total Protein: 6.5 g/dL (ref 6.0–8.3)
Triglycerides: 98 mg/dL (ref <150)
VLDL: 20 mg/dL (ref 0–40)

## 2010-01-01 ENCOUNTER — Encounter: Payer: BLUE CROSS/BLUE SHIELD | Admitting: Pulmonary Disease

## 2010-01-02 ENCOUNTER — Ambulatory Visit: Payer: Self-pay | Admitting: Family Medicine

## 2010-01-08 ENCOUNTER — Encounter: Payer: Self-pay | Admitting: Pulmonary Disease

## 2010-01-12 ENCOUNTER — Encounter: Payer: BLUE CROSS/BLUE SHIELD | Admitting: Pulmonary Disease

## 2010-02-07 ENCOUNTER — Telehealth (INDEPENDENT_AMBULATORY_CARE_PROVIDER_SITE_OTHER): Payer: Self-pay | Admitting: *Deleted

## 2010-02-08 ENCOUNTER — Ambulatory Visit: Payer: Self-pay | Admitting: Pulmonary Disease

## 2010-02-12 ENCOUNTER — Encounter: Payer: BLUE CROSS/BLUE SHIELD | Admitting: Pulmonary Disease

## 2010-02-14 ENCOUNTER — Ambulatory Visit: Payer: Self-pay | Admitting: Family Medicine

## 2010-02-14 ENCOUNTER — Ambulatory Visit: Payer: Self-pay | Admitting: Cardiovascular Disease

## 2010-02-27 ENCOUNTER — Telehealth: Payer: Self-pay | Admitting: Cardiovascular Disease

## 2010-03-14 ENCOUNTER — Encounter: Payer: BLUE CROSS/BLUE SHIELD | Admitting: Pulmonary Disease

## 2010-03-18 ENCOUNTER — Telehealth (INDEPENDENT_AMBULATORY_CARE_PROVIDER_SITE_OTHER): Payer: Self-pay | Admitting: *Deleted

## 2010-03-27 ENCOUNTER — Telehealth: Payer: Self-pay | Admitting: Family Medicine

## 2010-04-09 ENCOUNTER — Telehealth: Payer: Self-pay | Admitting: Internal Medicine

## 2010-04-23 ENCOUNTER — Telehealth: Payer: Self-pay | Admitting: Pulmonary Disease

## 2010-04-30 ENCOUNTER — Encounter: Payer: Self-pay | Admitting: Pulmonary Disease

## 2010-05-12 LAB — CONVERTED CEMR LAB
ALT: 13 units/L (ref 0–53)
AST: 16 units/L (ref 0–37)
Albumin: 4.1 g/dL (ref 3.5–5.2)
Alkaline Phosphatase: 82 units/L (ref 39–117)
BUN: 23 mg/dL (ref 6–23)
Basophils Absolute: 0 10*3/uL (ref 0.0–0.1)
Basophils Relative: 0 % (ref 0–1)
Bilirubin Urine: NEGATIVE
CO2: 23 meq/L (ref 19–32)
Calcium: 9.4 mg/dL (ref 8.4–10.5)
Chloride: 100 meq/L (ref 96–112)
Creatinine, Ser: 1.3 mg/dL (ref 0.40–1.50)
Eosinophils Absolute: 0.2 10*3/uL (ref 0.0–0.7)
Eosinophils Relative: 2 % (ref 0–5)
Glucose, Bld: 76 mg/dL (ref 70–99)
Glucose, Urine, Semiquant: NEGATIVE
HCT: 44.2 % (ref 39.0–52.0)
Hemoglobin: 14.5 g/dL (ref 13.0–17.0)
Ketones, urine, test strip: NEGATIVE
Lymphocytes Relative: 10 % — ABNORMAL LOW (ref 12–46)
Lymphs Abs: 1.2 10*3/uL (ref 0.7–4.0)
MCHC: 32.8 g/dL (ref 30.0–36.0)
MCV: 94.6 fL (ref 78.0–100.0)
Monocytes Absolute: 1.4 10*3/uL — ABNORMAL HIGH (ref 0.1–1.0)
Monocytes Relative: 12 % (ref 3–12)
Neutro Abs: 8.5 10*3/uL — ABNORMAL HIGH (ref 1.7–7.7)
Neutrophils Relative %: 75 % (ref 43–77)
Nitrite: NEGATIVE
Platelets: 173 10*3/uL (ref 150–400)
Potassium: 4.4 meq/L (ref 3.5–5.3)
Protein, U semiquant: NEGATIVE
RBC: 4.67 M/uL (ref 4.22–5.81)
RDW: 13.6 % (ref 11.5–15.5)
Sodium: 140 meq/L (ref 135–145)
Specific Gravity, Urine: 1.02
TSH: 1.451 microintl units/mL (ref 0.350–4.500)
Total Bilirubin: 0.7 mg/dL (ref 0.3–1.2)
Total Protein: 7.5 g/dL (ref 6.0–8.3)
Urobilinogen, UA: 0.2
Vitamin B-12: 631 pg/mL (ref 211–911)
WBC Urine, dipstick: NEGATIVE
WBC: 11.3 10*3/uL — ABNORMAL HIGH (ref 4.0–10.5)
pH: 6

## 2010-05-16 NOTE — Miscellaneous (Signed)
Summary: Order/[facility]  Order/[facility]   Imported By: Sherian Rein 05/06/2010 09:38:17  _____________________________________________________________________  External Attachment:    Type:   Image     Comment:   External Document

## 2010-05-16 NOTE — Progress Notes (Signed)
Summary: wants to change from lisinopril  Phone Note Call from Patient   Caller: Daughter in law, [name]  [phone] Summary of Call: Pt has had a cough since going on lisinopril and daughter in law is asking if he can change to something else.  She says the cough can get uncontrollable. Dr Shelle Iron told him it was probably coming from the medicine.    [facility]. Initial call taken by: Lowella Petties CMA,  Sep 05, 2009 10:11 AM  Follow-up for Phone Call        Start Cozaar 50mg  daily. See me one month after BMet 401.9 Follow-up by: Shaune Leeks MD,  Sep 05, 2009 10:51 AM  Additional Follow-up for Phone Call Additional follow up Details #1::        Advised pt's daughter, appts made. Additional Follow-up by: Lowella Petties CMA,  Sep 05, 2009 11:34 AM    New/Updated Medications: COZAAR 50 MG TABS (LOSARTAN POTASSIUM) one tab by mouth daily Prescriptions: COZAAR 50 MG TABS (LOSARTAN POTASSIUM) one tab by mouth daily  #90 x 4   Entered and Authorized by:   Shaune Leeks MD   Signed by:   Shaune Leeks MD on 09/05/2009   Method used:   [facility]        [id number]       [address]       [place], [place]  [address]       Ph: [phone]       Fax: [phone]   RxID:   [id number]

## 2010-05-16 NOTE — Assessment & Plan Note (Signed)
Summary: EC6  Medications Added * OXYGEN 3 L 24/7 ZYRTEC ALLERGY 10 MG  TABS (CETIRIZINE HCL) Take 1 tablet by mouth once a day MUCINEX DM MAXIMUM STRENGTH 60-1200 MG XR12H-TAB (DEXTROMETHORPHAN-GUAIFENESIN) 1 two times a day LEVAQUIN 750 MG TABS (LEVOFLOXACIN) Take 1 tablet by mouth once a day for 10 days      Allergies Added:   Visit Type:  Initial Consult Referring Provider:  Wenda Low Primary Provider:  Shaune Leeks MD  CC:  c/o being very short of breath and fatigue since yesterday a.m.  Also has some pain in upper arms and chest discomfort.  Marland Kitchen  History of Present Illness:  77 year old male with a history of coronary artery disease status post stenting of the third marginal with a nondrug-eluting stent in September 2005, complicated by stent thrombosis after discontinuation of his Plavix resulting in acute inferior and posterior myocardial infarction followed by repeat stenting with DES, long h/o COPD with recent episode of COPD exacerbation seen by Dr. Shelle Iron, Also with HTN, Diabetes,  hyperlipidemia, bradycardia requiring discontinuation of his beta-blocker, bilateral hip replacement, and obstructive sleep apnea with noncompliance to CPAP.  he has any inguinal hernia that needs to be repaired though at his request and the surgeons suggestion, I have decided to treat this only if it becomes an emergency.  He presents today with family and reports that for the past 5-6 days, he has had worsening cough, shortness of breath. He is coughing spasms to the point where he is unable to breathe. He is coughing episodes are better with cough syrup and decongestant and duo neb treatment. Is not getting better and he feels more fatigued and has malaise. He has some mild PND. His family is worried that he might be having cardiac problems as he does have a history of stent placement. He denies any significant lower extremity edema. No abdominal swelling. No lightheadedness or  dizziness.  EKG shows normal sinus rhythm with rate 79 beats per minute, no significant ST or T wave changes.    Current Medications (verified): 1)  Aspirin Ec 81 Mg Tbec (Aspirin) .... Take 2 Tablet By Mouth Once A Day 2)  Glimepiride 4 Mg Tabs (Glimepiride) .Marland Kitchen.. 1 By Mouth Once Daily 3)  Plavix 75 Mg Tabs (Clopidogrel Bisulfate) .Marland Kitchen.. 1 Daily By Mouth 4)  Oxygen 3 L .... 24/7 5)  Cozaar 50 Mg Tabs (Losartan Potassium) .... One Tab By Mouth Daily 6)  Proventil Hfa 108 (90 Base) Mcg/act Aers (Albuterol Sulfate) .... Inhale 2 Puffs Every 4 Hours As Needed 7)  Lasix 20 Mg  Tabs (Furosemide) .... Take 1 Tablet By Mouth Once A Day 8)  Klor-Con M20 20 Meq  Cr-Tabs (Potassium Chloride Crys Cr) .... Take 1 Tablet By Mouth Once A Day 9)  Zyrtec Allergy 10 Mg  Tabs (Cetirizine Hcl) .... Take 1 Tablet By Mouth Once A Day 10)  Delsym 30 Mg/64ml Lqcr (Dextromethorphan Polistirex) .... Take 2 Times Per Day As Needed 11)  Freestyle Lite Test  Strp (Glucose Blood) .... Use Once Daily To Monitor Diabetes 12)  Freestyle Lite  Devi (Blood Glucose Monitoring Suppl) .... Use Daily To Monitor Diabetes 13)  Nyquil .... Take At Bedtime 14)  Adult Gummy Vitamins .... Once Daily 15)  Vitamin D 1000 Unit Tabs (Cholecalciferol) .... Once Daily 16)  Duoneb 0.5-2.5 (3) Mg/59ml Soln (Ipratropium-Albuterol) .... One Treatment At Breakfast, Lunch, Dinner, Bedtime Everyday....can Take 2 Additional Treatments A Day If A Bad Day 17)  Budesonide 0.5  Mg/60ml Susp (Budesonide) .... One Treatment Twice Daily 18)  Simvastatin 80 Mg Tabs (Simvastatin) .... Take 1/2  Tablet By Mouth Once A Day 19)  Hydromet 5-1.5 Mg/20ml Syrp (Hydrocodone-Homatropine) .... 1/2 Tsp Three Times A Day As Needed Cough, May Make You Sleepy /off Balance 20)  Aricept 5 Mg Tabs (Donepezil Hcl) .... One Tab By Mouth Once Daily 21)  Aricept 10 Mg Tabs (Donepezil Hcl) .... One Tab By Mouth Once Daily 22)  Mucinex Dm Maximum Strength 60-1200 Mg Xr12h-Tab  (Dextromethorphan-Guaifenesin) .Marland Kitchen.. 1 Two Times A Day  Allergies (verified): 1)  ! Penicillin 2)  ! Biaxin 3)  Amoxicillin (Amoxicillin) 4)  * Penicillins Group  Past History:  Past Surgical History: Last updated: 11/03/2008 Hernia repair, Left recurrent 01/00 [facility],  Cholecystitis, cholecyst, ERCP 01/26/03-01/31/2003 CATH with PICA 04/09/04 Stress/Adenosine Myoview, abnormal EF 50% 03/25/04 Left THR, anemia 2 units PRBC 02/06 [facility] R/O + MI, succ stenosis 02/20-02/23/06 CATH, acute info post MI, stent PICA 06/03/2004 Rehab in pt. 02/16-02/23/07--out pt rehab Adenosine Myoview EF 46% 08/13/05 RHR 05/31/05 ETT Myoview Adenosine - no ischemia  09/02/06 HOSP R/O'D Hilar Pulm Mass 7/17-7/19/2010 CT Angio Chest Neg PE  3x3x2.2cm R Hilar Mass 10/28/2008 PET Scan Inflam/Infect Atelect RUL w/ Reactive Lymphadenopathy 11/02/2008  Family History: Last updated: 05-28-2009 Father: Died at age 25 Mother: Died at age 75 Brother  A 64 DM HTn Chol Vision DIffs Brother dec  65Parkinson's  six sisters, 5  living  one sister with breast cancer Sister dec 65  "Cancer" HBP - self DM - self Breast cancer - sister  Social History: Last updated: 01/31/2009 Marital Status: Married Children: 2 Occupation: Retired as a Administrator, 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   Risk Factors: Alcohol Use: 0 (05-28-09) Caffeine Use: 4 (05-28-2009) Exercise: no (05/28/2009)  Risk Factors: Smoking Status: quit (09/06/2009) Packs/Day: 1.0 (05/28/09)  Past Medical History: BRADYCARDIA (ICD-427.89) CT, CHEST, ABNORMAL (ICD-793.1) DEMENTIA (ICD-294.8) MASS, LUNG R HILUM (ICD-786.6) SHORTNESS OF BREATH (ICD-786.05) WEAKNESS (ICD-780.79) CHRONIC RESPIRATORY FAILURE (ICD-518.83) PURE HYPERCHOLESTEROLEMIA (ICD-272.0) OBSTRUCTIVE SLEEP APNEA (ICD-327.23) ALLERGIC RHINITIS, CHRONIC (ICD-477.9) EMPHYSEMA (ICD-492.8) INGUINAL HERNIA, RIGHT (ICD-550.90) DISORDER, SKIN  NEC R SHIN (ICD-709.8) COPD asthma emphysema  HEMORRHOIDS (ICD-455.6) VARICOSE VEIN, MILD (ICD-456.8) HYPERTENSION (ICD-401.9) DIABETES MELLITUS, TYPE II (ICD-250.00) CORONARY ARTERY DISEASE (ICD-414.00) COPD (ICD-496)  Review of Systems       The patient complains of dyspnea on exertion and prolonged cough.  The patient denies fever, weight loss, weight gain, vision loss, decreased hearing, hoarseness, chest pain, peripheral edema, abdominal pain, incontinence, muscle weakness, depression, and enlarged lymph nodes.         Fatigue, wheezing  Vital Signs:  Patient profile:   77 year old male Height:      67 inches Weight:      175 pounds BMI:     27.51 Pulse rate:   77 / minute BP sitting:   120 / 66  (left arm) Cuff size:   regular  Vitals Entered By: Bishop Dublin, CMA (November 01, 2009 11:27 AM)  Physical Exam  General:  Well developed, well nourished, on oxygen, appears tired Head:  normocephalic and atraumatic Neck:  Neck supple, no JVD. No masses, thyromegaly or abnormal cervical nodes. Lungs:  decreased breath sounds particularly at the right base halfway up, scattered wheezes bilaterally Heart:  Non-displaced PMI, chest non-tender; regular rate and rhythm, S1, S2 without murmurs, rubs or gallops. Carotid upstroke normal, no bruit.  Pedals normal pulses. No edema,  no varicosities. Abdomen:  Bowel sounds positive; abdomen soft and non-tender without masses Msk:  Back normal, normal gait. Muscle strength and tone normal. Pulses:  pulses normal in all 4 extremities Extremities:  No clubbing or cyanosis. Neurologic:  Alert and oriented x 3. Skin:  Intact without lesions or rashes. Psych:  Normal affect.   Impression & Recommendations:  Problem # 1:  CHRONIC OBSTRUCTIVE PULMONARY DISEASE, ACUTE EXACERBATION (ICD-491.21) I believe that his shortness of breath, cough, malaise is likely due to a COPD exacerbation. He had one last month and was treated with Levaquin and  prednisone with improvement of his symptoms. I suggested we do the same medications though he should have close followup with his pulmonologist. I've asked them to call him if his symptoms do not start getting better in several days time and he could provide guidance on the duration of antibiotic.  There does not seem to be signs of heart failure, no edema, no other abnormal swelling. Signs appear to be classic for COPD exacerbation.  The following medications were removed from the medication list:    Levaquin 750 Mg Tabs (Levofloxacin) ..... One tablet by mouth daily    Prednisone 10 Mg Tabs (Prednisone) .Marland Kitchen... Take 4 each day for 2 days, then 3 each day for 2 days, then 2 each day for 2 days, then 1 each day for 2 days, then stop His updated medication list for this problem includes:    Proventil Hfa 108 (90 Base) Mcg/act Aers (Albuterol sulfate) ..... Inhale 2 puffs every 4 hours as needed    Duoneb 0.5-2.5 (3) Mg/30ml Soln (Ipratropium-albuterol) ..... One treatment at breakfast, lunch, dinner, bedtime everyday....can take 2 additional treatments a day if a bad day    Budesonide 0.5 Mg/12ml Susp (Budesonide) ..... One treatment twice daily    Levaquin 750 Mg Tabs (Levofloxacin) .Marland Kitchen... Take 1 tablet by mouth once a day for 10 days  Orders: EKG w/ Interpretation ([address])  Problem # 2:  CORONARY ARTERY DISEASE (ICD-414.00) He does have a history of CAD. We'll continue him on his current medications. No other changes have been made. 1C recovers, we could consider a stress test. I believe it has been several years since his last study was performed. This could be discussed at a later date.  His updated medication list for this problem includes:    Aspirin Ec 81 Mg Tbec (Aspirin) .Marland Kitchen... Take 2 tablet by mouth once a day    Plavix 75 Mg Tabs (Clopidogrel bisulfate) .Marland Kitchen... 1 daily by mouth  Orders: EKG w/ Interpretation ([address])  Problem # 3:  HYPERLIPIDEMIA-MIXED (ICD-272.4) We'll continue him on  simvastatin 40 mg daily. total cholesterol when checked in February of this year was well controlled at 127.  His updated medication list for this problem includes:    Simvastatin 80 Mg Tabs (Simvastatin) .Marland Kitchen... Take 1/2  tablet by mouth once a day  Patient Instructions: 1)  Your physician recommends that you schedule a follow-up appointment in: 3 months  2)  Your physician has recommended you make the following change in your medication: Start levaquin and prednison taper.  Prescriptions: LEVAQUIN 750 MG TABS (LEVOFLOXACIN) Take 1 tablet by mouth once a day for 10 days  #10 x 0   Entered by:   Benedict Needy, RN   Authorized by:   Dossie Arbour MD   Signed by:   Benedict Needy, RN on 11/01/2009   Method used:   [facility]  [address] [id number]       [address]       [place], [place]  [address]       Ph: [phone]       Fax: [phone]   RxID:   [id number]

## 2010-05-16 NOTE — Assessment & Plan Note (Signed)
Summary: SOB, wheezing, cough///JJ   Copy to:  Wenda Low Primary Provider/Referring Provider:  Shaune Leeks MD  CC:  Acute visit.  Pt c/o increased SOB and wheezing x 1 wk.  Pt states that he gets SOB with just walking from room to room.  He also c/o increased cough- mainly dry but sometimes produces clear sputum. He had low grade temp last night.  He d/c'ed ACE on 5/26 and started on cozaar.  Marland Kitchen  History of Present Illness: 77 yo with known hx of COPD  Sep 06, 2009--Presents for Acute visit.  Pt c/o increased SOB and wheezing x 1 wk.  Pt states that he gets SOB with just walking from room to room.  He also c/o increased cough- mainly dry but sometimes produces clear sputum. He had low grade temp last night.  He d/c'ed ACE on 5/26 and started on cozaar. Appetite is down. He has not felt good for 2 weeks, cough and wheezing are getting worse. Denies chest pain, orthopnea, hemoptysis, fever, n/v/d, edema, headache,recent travel or antibiotics.   Preventive Screening-Counseling & Management  Alcohol-Tobacco     Smoking Status: quit  Current Medications (verified): 1)  Aspirin Ec 81 Mg Tbec (Aspirin) .... Take 2 Tablet By Mouth Once A Day 2)  Glimepiride 4 Mg Tabs (Glimepiride) .Marland Kitchen.. 1 By Mouth Once Daily 3)  Plavix 75 Mg Tabs (Clopidogrel Bisulfate) .Marland Kitchen.. 1 Daily By Mouth 4)  Oxygen 2 L .... 24/7 5)  Cozaar 50 Mg Tabs (Losartan Potassium) .... One Tab By Mouth Daily 6)  Proventil Hfa 108 (90 Base) Mcg/act Aers (Albuterol Sulfate) .... Inhale 2 Puffs Every 4 Hours As Needed 7)  Lasix 20 Mg  Tabs (Furosemide) .... Take 1 Tablet By Mouth Once A Day 8)  Klor-Con M20 20 Meq  Cr-Tabs (Potassium Chloride Crys Cr) .... Take 1 Tablet By Mouth Once A Day 9)  Zyrtec Allergy 10 Mg  Tabs (Cetirizine Hcl) .... Take 1 Tablet By Mouth Once A Day 10)  Delsym 30 Mg/51ml Lqcr (Dextromethorphan Polistirex) .... Take 2 Times Per Day As Needed 11)  Freestyle Lite Test  Strp (Glucose Blood) .... Use Once  Daily To Monitor Diabetes 12)  Freestyle Lite  Devi (Blood Glucose Monitoring Suppl) .... Use Daily To Monitor Diabetes 13)  Nyquil .... Take At Bedtime 14)  Adult Gummy Vitamins .... Once Daily 15)  Vitamin D 1000 Unit Tabs (Cholecalciferol) .... Once Daily 16)  Duoneb 0.5-2.5 (3) Mg/53ml Soln (Ipratropium-Albuterol) .... One Treatment At Breakfast, Lunch, Dinner, Bedtime Everyday....can Take 2 Additional Treatments A Day If A Bad Day 17)  Budesonide 0.5 Mg/15ml Susp (Budesonide) .... One Treatment Twice Daily 18)  Simvastatin 80 Mg Tabs (Simvastatin) .... Take 1 Tablet By Mouth Once A Day  Allergies (verified): 1)  ! Penicillin 2)  ! Biaxin 3)  Amoxicillin (Amoxicillin) 4)  * Penicillins Group  Past History:  Past Medical History: Last updated: 01/31/2009 BRADYCARDIA (ICD-427.89) CT, CHEST, ABNORMAL (ICD-793.1) DEMENTIA (ICD-294.8) MASS, LUNG R HILUM (ICD-786.6) SHORTNESS OF BREATH (ICD-786.05) WEAKNESS (ICD-780.79) CHRONIC RESPIRATORY FAILURE (ICD-518.83) PURE HYPERCHOLESTEROLEMIA (ICD-272.0) OBSTRUCTIVE SLEEP APNEA (ICD-327.23) ALLERGIC RHINITIS, CHRONIC (ICD-477.9) EMPHYSEMA (ICD-492.8) INGUINAL HERNIA, RIGHT (ICD-550.90) DISORDER, SKIN NEC R SHIN (ICD-709.8) HEMORRHOIDS (ICD-455.6) VARICOSE VEIN, MILD (ICD-456.8) HYPERTENSION (ICD-401.9) DIABETES MELLITUS, TYPE II (ICD-250.00) CORONARY ARTERY DISEASE (ICD-414.00) COPD (ICD-496)  Past Surgical History: Last updated: 11/03/2008 Hernia repair, Left recurrent 01/00 [facility],  Cholecystitis, cholecyst, ERCP 01/26/03-01/31/2003 CATH with PICA 04/09/04 Stress/Adenosine Myoview, abnormal EF 50% 03/25/04 Left THR, anemia 2 units  PRBC 02/06 [facility] R/O + MI, succ stenosis 02/20-02/23/06 CATH, acute info post MI, stent PICA 06/03/2004 Rehab in pt. 02/16-02/23/07--out pt rehab Adenosine Myoview EF 46% 08/13/05 RHR 05/31/05 ETT Myoview Adenosine - no ischemia  09/02/06 HOSP R/O'D Hilar Pulm Mass 7/17-7/19/2010 CT Angio Chest Neg PE   3x3x2.2cm R Hilar Mass 10/28/2008 PET Scan Inflam/Infect Atelect RUL w/ Reactive Lymphadenopathy 11/02/2008  Family History: Last updated: 2009/06/09 Father: Died at age 60 Mother: Died at age 7 Brother  A 46 DM HTn Chol Vision DIffs Brother dec  65Parkinson's  six sisters, 5  living  one sister with breast cancer Sister dec 65  "Cancer" HBP - self DM - self Breast cancer - sister  Social History: Last updated: 01/31/2009 Marital Status: Married Children: 2 Occupation: Retired as a Administrator, 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   Risk Factors: Smoking Status: quit (09/06/2009) Packs/Day: 1.0 (06-09-09)  Review of Systems      See HPI  Vital Signs:  Patient profile:   76 year old male Weight:      182 pounds O2 Sat:      95 % on 2.5 lpm pulsed Temp:     99.1 degrees F oral Pulse rate:   70 / minute BP sitting:   118 / 66  (left arm)  Vitals Entered By: Vernie Murders (Sep 06, 2009 11:01 AM)  O2 Flow:  2.5 lpm pulsed CC: Acute visit.  Pt c/o increased SOB and wheezing x 1 wk.  Pt states that he gets SOB with just walking from room to room.  He also c/o increased cough- mainly dry but sometimes produces clear sputum. He had low grade temp last night.  He d/c'ed ACE on 5/26 and started on cozaar.   Is Patient Diabetic? Yes   Physical Exam  Additional Exam:  GEN: A/Ox3; pleasant , NAD HEENT:  [place]/AT, , EACs-clear, TMs-wnl, NOSE-clear, THROAT-clear NECK:  Supple w/ fair ROM; no JVD; normal carotid impulses w/o bruits; no thyromegaly or nodules palpated; no lymphadenopathy. RESP  Coarse BS w/ scattered rhonchi and exp wheezing CARD:  RRR, no m/r/g   GI:   Soft & nt; nml bowel sounds; no organomegaly or masses detected. [place]: Warm bil,  no calf tenderness edema, clubbing, pulses intact Neuro: intact w/ no focal deficits noted.    Impression & Recommendations:  Problem # 1:  CHRONIC OBSTRUCTIVE PULMONARY DISEASE, ACUTE  EXACERBATION (ICD-491.21)  Flare  REC:  Levaquin 500mg  once daily for 7 days Mucinex DM two times a day as needed cough/congesiton Prednisone taper over next week.  Increase fluids.  May use Hydromet 1/2 tsp every 8 hr as needed cough, may make you sleepy/off balance.  Please contact office for sooner follow up if symptoms do not improve or worsen  follow up Dr. Shelle Iron as scheduled and as needed   Orders: Est. Patient Level IV (04540) Prescription Created Electronically [id number])  Medications Added to Medication List This Visit: 1)  Levaquin 500 Mg Tabs (Levofloxacin) .Marland Kitchen.. 1 by mouth once daily 2)  Prednisone 10 Mg Tabs (Prednisone) .... 4 tabs for 2 days, then 3 tabs for 2 days, 2 tabs for 2 days, then 1 tab for 2 days, then stop 3)  Hydromet 5-1.5 Mg/31ml Syrp (Hydrocodone-homatropine) .... 1/2 tsp three times a day as needed cough, may make you sleepy /off balance  Patient Instructions: 1)  Levaquin 500mg  once daily for 7 days 2)  Mucinex DM two times a day  as needed cough/congesiton 3)  Prednisone taper over next week.  4)  Increase fluids.  5)  May use Hydromet 1/2 tsp every 8 hr as needed cough, may make you sleepy/off balance.  6)  Please contact office for sooner follow up if symptoms do not improve or worsen  7)  follow up Dr. Shelle Iron as scheduled and as needed  Prescriptions: HYDROMET 5-1.5 MG/5ML SYRP (HYDROCODONE-HOMATROPINE) 1/2 tsp three times a day as needed cough, may make you sleepy /off balance  #8 oz x 0   Entered and Authorized by:   Rubye Oaks NP   Signed by:   Tammy Parrett NP on 09/06/2009   Method used:   Print then Give to Patient   RxID:   [id number] PREDNISONE 10 MG TABS (PREDNISONE) 4 tabs for 2 days, then 3 tabs for 2 days, 2 tabs for 2 days, then 1 tab for 2 days, then stop  #20 x 0   Entered and Authorized by:   Rubye Oaks NP   Signed by:   Rubye Oaks NP on 09/06/2009   Method used:   [facility] to        [id number]       [address]       [place], [place]  [address]       Ph: [phone]       Fax: [phone]   RxID:   [id number] LEVAQUIN 500 MG TABS (LEVOFLOXACIN) 1 by mouth once daily  #7 x 0   Entered and Authorized by:   Rubye Oaks NP   Signed by:   Rubye Oaks NP on 09/06/2009   Method used:   Electronically to        [id number]       [address]       [place], [place]  [address]       Ph: [phone]       Fax: [phone]   RxID:   [id number]

## 2010-05-16 NOTE — Progress Notes (Signed)
Summary: talk to nurse  Phone Note Call from Patient Call back at [phone]   Caller: Daughter in law//bonnie Call For: clance Summary of Call: States that pt hasn't been feeling well since yesterday occassional wheezing, sleeping a lot , she says that [facility] told them if he developed any respiratory issues to come see him instead of going to ER, wants an appt today. Initial call taken by: Darletta Moll,  Sep 05, 2009 2:17 PM  Follow-up for Phone Call        called spoke with bonnie who states that pt has has increased SOB, wheezing, prod cough with clear mucus and low grade temp x3days.  per 09-05-09 phone note with PCP, pt stopped lisinopril and will begin taking cozaar today.  pt to see TP tomorrow 09-06-09 @ 1100.  bonnie okay with this date/time. Follow-up by: Boone Master CNA/MA,  Sep 05, 2009 2:49 PM

## 2010-05-16 NOTE — Letter (Signed)
Summary: MMX Form  MMX Form   Imported By: Beau Fanny 10/12/2009 10:31:11  _____________________________________________________________________  External Attachment:    Type:   Image     Comment:   External Document

## 2010-05-16 NOTE — Assessment & Plan Note (Signed)
Summary: ROV/AMD   Referring Provider:  Wenda Low Primary Provider:  Shaune Leeks MD  CC:  Consult; Surgical clearance for hernia repair; REQUESTing any samples of Rx and if possible.  History of Present Illness: [name] is a delightful 77 year old male with a history of coronary artery disease status post stenting of the third marginal with a nondrug-eluting stent in September 2005.  This was complicated by stent thrombosis after discontinuation of his Plavix resulting in acute inferior and posterior myocardial infarction followed by repeat stenting with DES.   The remainder of his medical history is notable for diabetes, hypertension, hyperlipidemia, O2 dependent COPD, bradycardia requiring discontinuation of his beta-blocker, bilateral hip replacement, and obstructive sleep apnea with noncompliance to CPAP.   Mr. [name] states that he has any inguinal hernia that needs to be repaired. He will have this done with [facility], Dr. Luretha Murphy.   He has no new complaints, states that his diabetes is relatively well controlled. He denies any chest pain, has chronic shortness of breath and uses oxygen 1-2 L on a frequent basis. He is able to ambulate slowly with his oxygen and has no chest pain.   Problems Prior to Update: 1)  Emphysema  (ICD-492.8) 2)  Bradycardia  (ICD-427.89) 3)  Ct, Chest, Abnormal  (ICD-793.1) 4)  Dementia  (ICD-294.8) 5)  Mass, Lung R Hilum  (ICD-786.6) 6)  Shortness of Breath  (ICD-786.05) 7)  Weakness  (ICD-780.79) 8)  Chronic Respiratory Failure  (ICD-518.83) 9)  Pure Hypercholesterolemia  (ICD-272.0) 10)  Obstructive Sleep Apnea  (ICD-327.23) 11)  Allergic Rhinitis, Chronic  (ICD-477.9) 12)  Special Screening Malig Neoplasms Other Sites  (ICD-V76.49) 13)  Inguinal Hernia, Right  (ICD-550.90) 14)  Disorder, Skin Nec R Shin  (ICD-709.8) 15)  Hemorrhoids  (ICD-455.6) 16)  Varicose Vein, Mild  (ICD-456.8) 17)  Hypertension   (ICD-401.9) 18)  Diabetes Mellitus, Type II  (ICD-250.00) 19)  Coronary Artery Disease  (ICD-414.00)  Medications Prior to Update: 1)  Aspirin Ec 81 Mg Tbec (Aspirin) .... Take 1 Tablet By Mouth Once A Day 2)  Glimepiride 4 Mg Tabs (Glimepiride) .Marland Kitchen.. 1 By Mouth Once Daily 3)  Plavix 75 Mg Tabs (Clopidogrel Bisulfate) .Marland Kitchen.. 1 Daily By Mouth 4)  Oxygen 2 L .... 24/7 5)  Lisinopril 5 Mg  Tabs (Lisinopril) .Marland Kitchen.. 1 By Mouth Daily 6)  Lipitor 40 Mg Tabs (Atorvastatin Calcium) .... Take 1 Tablet By Mouth Once A Day 7)  Proventil Hfa 108 (90 Base) Mcg/act Aers (Albuterol Sulfate) .... Inhale 2 Puffs Every 4 Hours As Needed 8)  Lasix 20 Mg  Tabs (Furosemide) .... Take 1 Tablet By Mouth Once A Day 9)  Klor-Con M20 20 Meq  Cr-Tabs (Potassium Chloride Crys Cr) .... Take 1 Tablet By Mouth Once A Day 10)  Zyrtec Allergy 10 Mg  Tabs (Cetirizine Hcl) .... Take 1 Tablet By Mouth Once A Day 11)  Delsym 30 Mg/34ml Lqcr (Dextromethorphan Polistirex) .... Take 2 Times Per Day As Needed 12)  Freestyle Lite Test  Strp (Glucose Blood) .... Use Once Daily To Monitor Diabetes 13)  Freestyle Lite  Devi (Blood Glucose Monitoring Suppl) .... Use Daily To Monitor Diabetes 14)  Nyquil .... Take At Bedtime 15)  Adult Gummy Vitamins .... Once Daily 16)  Vitamin D 1000 Unit Tabs (Cholecalciferol) .... Once Daily 17)  Duoneb 0.5-2.5 (3) Mg/52ml Soln (Ipratropium-Albuterol) .... One Treatment At Breakfast, Lunch, Dinner, Bedtime Everyday....can Take 2 Additional Treatments A Day If A Bad Day 18)  Budesonide 0.5 Mg/20ml Susp (Budesonide) .... One Treatment Twice Daily  Current Medications (verified): 1)  Aspirin Ec 81 Mg Tbec (Aspirin) .... Take 1 Tablet By Mouth Once A Day 2)  Glimepiride 4 Mg Tabs (Glimepiride) .Marland Kitchen.. 1 By Mouth Once Daily 3)  Plavix 75 Mg Tabs (Clopidogrel Bisulfate) .Marland Kitchen.. 1 Daily By Mouth 4)  Oxygen 2 L .... 24/7 5)  Lisinopril 5 Mg  Tabs (Lisinopril) .Marland Kitchen.. 1 By Mouth Daily 6)  Lipitor 40 Mg Tabs (Atorvastatin  Calcium) .... Take 1 Tablet By Mouth Once A Day 7)  Proventil Hfa 108 (90 Base) Mcg/act Aers (Albuterol Sulfate) .... Inhale 2 Puffs Every 4 Hours As Needed 8)  Lasix 20 Mg  Tabs (Furosemide) .... Take 1 Tablet By Mouth Once A Day 9)  Klor-Con M20 20 Meq  Cr-Tabs (Potassium Chloride Crys Cr) .... Take 1 Tablet By Mouth Once A Day 10)  Zyrtec Allergy 10 Mg  Tabs (Cetirizine Hcl) .... Take 1 Tablet By Mouth Once A Day 11)  Delsym 30 Mg/17ml Lqcr (Dextromethorphan Polistirex) .... Take 2 Times Per Day As Needed 12)  Freestyle Lite Test  Strp (Glucose Blood) .... Use Once Daily To Monitor Diabetes 13)  Freestyle Lite  Devi (Blood Glucose Monitoring Suppl) .... Use Daily To Monitor Diabetes 14)  Nyquil .... Take At Bedtime 15)  Adult Gummy Vitamins .... Once Daily 16)  Vitamin D 1000 Unit Tabs (Cholecalciferol) .... Once Daily 17)  Duoneb 0.5-2.5 (3) Mg/55ml Soln (Ipratropium-Albuterol) .... One Treatment At Breakfast, Lunch, Dinner, Bedtime Everyday....can Take 2 Additional Treatments A Day If A Bad Day 18)  Budesonide 0.5 Mg/12ml Susp (Budesonide) .... One Treatment Twice Daily  Allergies: 1)  ! Penicillin 2)  ! Biaxin 3)  Amoxicillin (Amoxicillin) 4)  * Penicillins Group  Past History:  Past Medical History: Last updated: 01/31/2009 BRADYCARDIA (ICD-427.89) CT, CHEST, ABNORMAL (ICD-793.1) DEMENTIA (ICD-294.8) MASS, LUNG R HILUM (ICD-786.6) SHORTNESS OF BREATH (ICD-786.05) WEAKNESS (ICD-780.79) CHRONIC RESPIRATORY FAILURE (ICD-518.83) PURE HYPERCHOLESTEROLEMIA (ICD-272.0) OBSTRUCTIVE SLEEP APNEA (ICD-327.23) ALLERGIC RHINITIS, CHRONIC (ICD-477.9) EMPHYSEMA (ICD-492.8) INGUINAL HERNIA, RIGHT (ICD-550.90) DISORDER, SKIN NEC R SHIN (ICD-709.8) HEMORRHOIDS (ICD-455.6) VARICOSE VEIN, MILD (ICD-456.8) HYPERTENSION (ICD-401.9) DIABETES MELLITUS, TYPE II (ICD-250.00) CORONARY ARTERY DISEASE (ICD-414.00) COPD (ICD-496)  Past Surgical History: Last updated: 11/03/2008 Hernia repair,  Left recurrent 01/00 [facility],  Cholecystitis, cholecyst, ERCP 01/26/03-01/31/2003 CATH with PICA 04/09/04 Stress/Adenosine Myoview, abnormal EF 50% 03/25/04 Left THR, anemia 2 units PRBC 02/06 [facility] R/O + MI, succ stenosis 02/20-02/23/06 CATH, acute info post MI, stent PICA 06/03/2004 Rehab in pt. 02/16-02/23/07--out pt rehab Adenosine Myoview EF 46% 08/13/05 RHR 05/31/05 ETT Myoview Adenosine - no ischemia  09/02/06 HOSP R/O'D Hilar Pulm Mass 7/17-7/19/2010 CT Angio Chest Neg PE  3x3x2.2cm R Hilar Mass 10/28/2008 PET Scan Inflam/Infect Atelect RUL w/ Reactive Lymphadenopathy 11/02/2008  Family History: Last updated: 07-Jun-2009 Father: Died at age 28 Mother: Died at age 34 Brother  A 34 DM HTn Chol Vision DIffs Brother dec  65Parkinson's  six sisters, 5  living  one sister with breast cancer Sister dec 65  "Cancer" HBP - self DM - self Breast cancer - sister  Social History: Last updated: 01/31/2009 Marital Status: Married Children: 2 Occupation: Retired as a Administrator, 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   Risk Factors: Alcohol Use: 0 (2009-06-07) Caffeine Use: 4 (2009/06/07) Exercise: no (06-07-09)  Risk Factors: Smoking Status: quit (2009-06-07) Packs/Day: 1.0 (June 07, 2009)  Review of Systems       The patient complains  of dyspnea on exertion.  The patient denies fever, weight loss, weight gain, vision loss, decreased hearing, hoarseness, chest pain, syncope, peripheral edema, prolonged cough, abdominal pain, incontinence, muscle weakness, depression, and enlarged lymph nodes.    Vital Signs:  Patient profile:   77 year old male Height:      67 inches Weight:      180 pounds BMI:     28.29 Pulse rate:   60 / minute BP sitting:   128 / 60  (right arm) Cuff size:   regular  Vitals Entered By: Stanton Kidney, EMT-P (Aug 24, 2009 3:59 PM)  Physical Exam  General:  elderly gentleman in no apparent distress, HENT has benign,  oropharynx is clear neck is supple with no JVP or carotid bruits, heart sounds are regular with S1-S2 and 1-2/6 systolic ejection murmur heard at the right sternal border, lungs have decreased breath sounds throughout otherwise clear, abdominal exam is benign, no significant lower extremity edema, neurologic exam is nonfocal, skin is warm and dry. Pulses are equal and symmetrical in his upper and lower extremities.    EKG  Procedure date:  08/24/2009  Findings:      normal sinus rhythm with rate 60 beats per minute, no significant ST or T wave changes, low voltage in the limb leads.  Impression & Recommendations:  Problem # 1:  CORONARY ARTERY DISEASE (ICD-414.00) history of coronary artery disease with stent placement as previously detailed. No symptoms of angina. Given that he has no overt signs of angina, no further workup is needed at this time. he would be acceptable risk for his hernia surgery with Dr. Daphine Deutscher. We've asked him to stop his Plavix 5 days prior to the surgery.  His updated medication list for this problem includes:    Aspirin Ec 81 Mg Tbec (Aspirin) .Marland Kitchen... Take 1 tablet by mouth once a day    Plavix 75 Mg Tabs (Clopidogrel bisulfate) .Marland Kitchen... 1 daily by mouth    Lisinopril 5 Mg Tabs (Lisinopril) .Marland Kitchen... 1 by mouth daily  Problem # 2:  EMPHYSEMA (ICD-492.8) Long history of smoking. In with COPD using chronic oxygen particularly with exertion.  Problem # 3:  HYPERTENSION (ICD-401.9) blood pressure is well controlled and we've made no further medication changes.  His updated medication list for this problem includes:    Aspirin Ec 81 Mg Tbec (Aspirin) .Marland Kitchen... Take 1 tablet by mouth once a day    Lisinopril 5 Mg Tabs (Lisinopril) .Marland Kitchen... 1 by mouth daily    Lasix 20 Mg Tabs (Furosemide) .Marland Kitchen... Take 1 tablet by mouth once a day  Problem # 4:  HYPERLIPIDEMIA-MIXED (ICD-272.4) He and his daughter in law are very concerned about the cost of Lipitor. I mentioned that it would be  going generic soon that they would like to change it to an ultimate medication. I will start him on simvastatin 80 mg daily and have suggested we recheck his cholesterol in 3 months.  The following medications were removed from the medication list:    Lipitor 40 Mg Tabs (Atorvastatin calcium) .Marland Kitchen... Take 1 tablet by mouth once a day His updated medication list for this problem includes:    Simvastatin 80 Mg Tabs (Simvastatin) .Marland Kitchen... Take 1 tablet by mouth once a day  Patient Instructions: 1)  Your physician recommends that you return for a FASTING lipid profile: in September 2011 2)  Your physician has recommended you make the following change in your medication: Stop Lipitor and Start Simvastatin 80mg  once daily.  Prescriptions: SIMVASTATIN 80 MG TABS (SIMVASTATIN) Take 1 tablet by mouth once a day  #30 x 6   Entered by:   Cloyde Reams RN   Authorized by:   Dossie Arbour MD   Signed by:   Cloyde Reams RN on 08/24/2009   Method used:   Electronically to        [id number] (retail)       [address]       [place], [place]  [address]       Ph: [phone]       Fax: [phone]   RxID:   [id number]

## 2010-05-16 NOTE — Assessment & Plan Note (Signed)
Summary: rov for severe emphysema, chronic RF   Primary Provider/Referring Provider:  Shaune Leeks MD  CC:  Pt is here for a routine f/u appt.  Pt states increased sob with exertion, chest congestion with cream colored sputum, difficulty coughing up sputum, and "no strength."  .  History of Present Illness: The pt comes in today for f/u of his known severe emphysema with chronic respiratory failure.  He has maintained on his nebulizer treatments, but is having decreasing functional status per family member.  He is continuing to have severe postnasal drip and rhinorrhea, and this gets into his chest and causes breathing difficulties at times.  He also has ongoing chest rattle/congestion, and it is unclear how much of this is coming from his upper airway.  He has cough with nonpurulent mucus.  He denies any f/c/s.  He has had no chest pain. no Current Medications (verified): 1)  Aspirin Ec 81 Mg Tbec (Aspirin) .... Take 2 Tablet By Mouth Once A Day 2)  Glimepiride 4 Mg Tabs (Glimepiride) .Marland Kitchen.. 1 By Mouth Once Daily 3)  Plavix 75 Mg Tabs (Clopidogrel Bisulfate) .Marland Kitchen.. 1 Daily By Mouth 4)  Oxygen 3 L .... 24/7 5)  Cozaar 50 Mg Tabs (Losartan Potassium) .... One Tab By Mouth Daily 6)  Proventil Hfa 108 (90 Base) Mcg/act Aers (Albuterol Sulfate) .... Inhale 2 Puffs Every 4 Hours As Needed 7)  Lasix 20 Mg  Tabs (Furosemide) .... Take 1 Tablet By Mouth Once A Day 8)  Klor-Con M20 20 Meq  Cr-Tabs (Potassium Chloride Crys Cr) .... Take 1 Tablet By Mouth Once A Day 9)  Zyrtec Allergy 10 Mg  Tabs (Cetirizine Hcl) .... Take 1 Tablet By Mouth Once A Day 10)  Delsym 30 Mg/49ml Lqcr (Dextromethorphan Polistirex) .... Take 2 Times Per Day As Needed 11)  Freestyle Lite Test  Strp (Glucose Blood) .... Use Once Daily To Monitor Diabetes 12)  Freestyle Lite  Devi (Blood Glucose Monitoring Suppl) .... Use Daily To Monitor Diabetes 13)  Nyquil .... Take At Bedtime 14)  Adult Gummy Vitamins .... Once Daily 15)   Vitamin D 1000 Unit Tabs (Cholecalciferol) .... Once Daily 16)  Duoneb 0.5-2.5 (3) Mg/69ml Soln (Ipratropium-Albuterol) .... One Treatment At Breakfast, Lunch, Dinner, Bedtime Everyday....can Take 2 Additional Treatments A Day If A Bad Day 17)  Budesonide 0.5 Mg/63ml Susp (Budesonide) .... One Treatment Twice Daily 18)  Simvastatin 80 Mg Tabs (Simvastatin) .... Take 1/2  Tablet By Mouth Once A Day 19)  Hydromet 5-1.5 Mg/49ml Syrp (Hydrocodone-Homatropine) .... 1/2 Tsp Three Times A Day As Needed Cough, May Make You Sleepy /off Balance 20)  Aricept 10 Mg Tabs (Donepezil Hcl) .... One Tab By Mouth Once Daily 21)  Mucinex Dm Maximum Strength 60-1200 Mg Xr12h-Tab (Dextromethorphan-Guaifenesin) .Marland Kitchen.. 1 Two Times A Day  Allergies (verified): 1)  ! Penicillin 2)  ! Biaxin 3)  Amoxicillin (Amoxicillin) 4)  * Penicillins Group  Past History:  Past medical, surgical, family and social histories (including risk factors) reviewed, and no changes noted (except as noted below).  Past Medical History: Reviewed history from 11/01/2009 and no changes required. BRADYCARDIA (ICD-427.89) CT, CHEST, ABNORMAL (ICD-793.1) DEMENTIA (ICD-294.8) MASS, LUNG R HILUM (ICD-786.6) SHORTNESS OF BREATH (ICD-786.05) WEAKNESS (ICD-780.79) CHRONIC RESPIRATORY FAILURE (ICD-518.83) PURE HYPERCHOLESTEROLEMIA (ICD-272.0) OBSTRUCTIVE SLEEP APNEA (ICD-327.23) ALLERGIC RHINITIS, CHRONIC (ICD-477.9) EMPHYSEMA (ICD-492.8) INGUINAL HERNIA, RIGHT (ICD-550.90) DISORDER, SKIN NEC R SHIN (ICD-709.8) COPD asthma emphysema  HEMORRHOIDS (ICD-455.6) VARICOSE VEIN, MILD (ICD-456.8) HYPERTENSION (ICD-401.9) DIABETES MELLITUS, TYPE II (ICD-250.00) CORONARY  ARTERY DISEASE (ICD-414.00) COPD (ICD-496)  Past Surgical History: Reviewed history from 11/03/2008 and no changes required. Hernia repair, Left recurrent 01/00 [facility],  Cholecystitis, cholecyst, ERCP 01/26/03-01/31/2003 CATH with PICA 04/09/04 Stress/Adenosine Myoview, abnormal  EF 50% 03/25/04 Left THR, anemia 2 units PRBC 02/06 [facility] R/O + MI, succ stenosis 02/20-02/23/06 CATH, acute info post MI, stent PICA 06/03/2004 Rehab in pt. 02/16-02/23/07--out pt rehab Adenosine Myoview EF 46% 08/13/05 RHR 05/31/05 ETT Myoview Adenosine - no ischemia  09/02/06 HOSP R/O'D Hilar Pulm Mass 7/17-7/19/2010 CT Angio Chest Neg PE  3x3x2.2cm R Hilar Mass 10/28/2008 PET Scan Inflam/Infect Atelect RUL w/ Reactive Lymphadenopathy 11/02/2008  Family History: Reviewed history from 05/23/2009 and no changes required. Father: Died at age 30 Mother: Died at age 79 Brother  A 11 DM HTn Chol Vision DIffs Brother dec  65Parkinson's  six sisters, 5  living  one sister with breast cancer Sister dec 65  "Cancer" HBP - self DM - self Breast cancer - sister  Social History: Reviewed history from 01/31/2009 and no changes required. 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   Review of Systems       The patient complains of shortness of breath with activity, productive cough, and nasal congestion/difficulty breathing through nose.  The patient denies shortness of breath at rest, non-productive cough, coughing up blood, chest pain, irregular heartbeats, acid heartburn, indigestion, loss of appetite, weight change, abdominal pain, difficulty swallowing, sore throat, tooth/dental problems, headaches, sneezing, itching, ear ache, anxiety, depression, hand/feet swelling, joint stiffness or pain, rash, change in color of mucus, and fever.    Vital Signs:  Patient profile:   77 year old male Height:      67 inches Weight:      175.50 pounds O2 Sat:      94 % on 2.5 LPM pulsed Temp:     97.7 degrees F oral Pulse rate:   77 / minute BP sitting:   110 / 70  (left arm) Cuff size:   regular  Vitals Entered By: Arman Filter LPN (December 12, 2009 3:05 PM)  O2 Flow:  2.5 LPM pulsed CC: Pt  is here for a routine f/u appt.  Pt states increased sob with exertion, chest congestion with cream colored sputum, difficulty coughing up sputum, "no strength."   Comments Medications reviewed with patient  Arman Filter LPN  December 12, 2009 3:07 PM    Physical Exam  General:  wd male in nad Nose:  no purulence noted. Lungs:  decreased bs throughout, no wheezing Heart:  rrr, no mrg distant heart sounds. Extremities:  no edema or cyanosis Neurologic:  alert and oriented, moves all 4.   Impression & Recommendations:  Problem # 1:  EMPHYSEMA (ICD-492.8) The pt has severe emphysema with chronic respiratory failure.  He has been having decreasing functional status per his family member, and upon discussing with him, I think decreased endurance and loss of muscle mass are major contributors.  I have recommended the pt try pulmonary rehab, and he is willing.  Will also work on pulmonary hygiene with flutter valve.  If he continues to have ongoing issues, can try chronic low dose prednisone, but this is the last resort.  Problem # 2:  ALLERGIC RHINITIS CAUSE UNSPECIFIED (ICD-477.9)  the pt has had persistent postnasal drip and rhinorrhea, that is leading to congestion in his chest.  I suspect this is nonallergic (vasomotor rhinitis),  given his lack of response to antihistamines and nasal ICS.  Will try him on atrovent nasal spray  Medications Added to Medication List This Visit: 1)  Ipratropium Bromide 0.06 % Soln (Ipratropium bromide) .... 2 each nostril each am  Other Orders: Rehabilitation Referral (Rehab) Est. Patient Level IV (81191)  Patient Instructions: 1)  will try atrovent nasal spray 2 each nostril in am 2)  try flutter valve to help get mucus out of your chest am and pm 3)  no change in breathing meds. 4)  will refer to pulmonary rehab at [facility] 5)  followup with me in 3mos   Prescriptions: PROVENTIL HFA 108 (90 BASE) MCG/ACT AERS (ALBUTEROL SULFATE) Inhale 2  puffs every 4 hours as needed  #1 x 6   Entered and Authorized by:   Barbaraann Share MD   Signed by:   Barbaraann Share MD on 12/12/2009   Method used:   Print then Give to Patient   RxID:   [id number] IPRATROPIUM BROMIDE 0.06 % SOLN (IPRATROPIUM BROMIDE) 2 each nostril each am  #1 x 6   Entered and Authorized by:   Barbaraann Share MD   Signed by:   Barbaraann Share MD on 12/12/2009   Method used:   Print then Give to Patient   RxID:   [id number]

## 2010-05-16 NOTE — Assessment & Plan Note (Signed)
Summary: rov for preop pulmonary clearance   Copy to:  Wenda Low Primary Provider/Referring Provider:  Shaune Leeks MD  CC:  Pt is here for surgical clearance for hernia repair.  Surgery has not been scheduled yet.  Surgeon is with [facility]. Pt sates breathing is unchanged from last visit but daughter believes breathing has improved since starting nebs.  History of Present Illness: the pt comes in today for preop pulmonary clearance for upcoming inguinal hernia surgery.  He has known oxygen dep. moderate to severe emphysema, but has done fairly well.  He is currently being maintained on neb treatments along with his oxygen.  He has had no recent decline in his exertional tolerance, and denies any symptoms of an active infection.    Medications Prior to Update: 1)  Aspirin Ec 81 Mg Tbec (Aspirin) .... Take 1 Tablet By Mouth Once A Day 2)  Glimepiride 4 Mg Tabs (Glimepiride) .Marland Kitchen.. 1 By Mouth Once Daily 3)  Plavix 75 Mg Tabs (Clopidogrel Bisulfate) .Marland Kitchen.. 1 Daily By Mouth 4)  Oxygen 2 L .... 24/7 5)  Lisinopril 5 Mg  Tabs (Lisinopril) .Marland Kitchen.. 1 By Mouth Daily 6)  Lipitor 40 Mg Tabs (Atorvastatin Calcium) .... Take 1 Tablet By Mouth Once A Day 7)  Proventil Hfa 108 (90 Base) Mcg/act Aers (Albuterol Sulfate) .... Inhale 2 Puffs Every 4 Hours As Needed 8)  Lasix 20 Mg  Tabs (Furosemide) .... Take 1 Tablet By Mouth Once A Day 9)  Klor-Con M20 20 Meq  Cr-Tabs (Potassium Chloride Crys Cr) .... Take 1 Tablet By Mouth Once A Day 10)  Zyrtec Allergy 10 Mg  Tabs (Cetirizine Hcl) .... Take 1 Tablet By Mouth Once A Day 11)  Delsym 30 Mg/45ml Lqcr (Dextromethorphan Polistirex) .... Take 2 Times Per Day As Needed 12)  Freestyle Lite Test  Strp (Glucose Blood) .... Use Once Daily To Monitor Diabetes 13)  Freestyle Lite  Devi (Blood Glucose Monitoring Suppl) .... Use Daily To Monitor Diabetes 14)  Nyquil .... Take At Bedtime 15)  Adult Gummy Vitamins .... Once Daily 16)  Vitamin D 1000 Unit Tabs  (Cholecalciferol) .... Once Daily 17)  Duoneb 0.5-2.5 (3) Mg/32ml Soln (Ipratropium-Albuterol) .... One Treatment At Breakfast, Lunch, Dinner, Bedtime Everyday....can Take 2 Additional Treatments A Day If A Bad Day 18)  Budesonide 0.5 Mg/61ml Susp (Budesonide) .... One Treatment Twice Daily  Allergies (verified): 1)  ! Penicillin 2)  ! Biaxin 3)  Amoxicillin (Amoxicillin) 4)  * Penicillins Group  Review of Systems      See HPI  Vital Signs:  Patient profile:   77 year old male Height:      69 inches Weight:      182 pounds BMI:     26.97 O2 Sat:      95 % on 2 LPM pulsed Temp:     97.4 degrees F oral Pulse rate:   63 / minute BP sitting:   110 / 60  (left arm) Cuff size:   regular  Vitals Entered By: Arman Filter LPN (Aug 23, 2009 2:57 PM)  O2 Flow:  2 LPM pulsed CC: Pt is here for surgical clearance for hernia repair.  Surgery has not been scheduled yet.  Surgeon is with [facility]. Pt sates breathing is unchanged from last visit but daughter believes breathing has improved since starting nebs Comments Medications reviewed with patient Arman Filter LPN  Aug 23, 2009 3:02 PM    Physical Exam  General:  wd male in  nad Lungs:  decreased bs throughout, but no wheezing or rhonchi Heart:  rrr Extremities:  no significant edema or cyanosis   Impression & Recommendations:  Problem # 1:  EMPHYSEMA (ICD-492.8)  the pt is doing as well as can be expected from a pulmonary standpoint.  He should do ok with upcoming surgery, but there is always some risk of decompensation.  I don't see anything to "tune up", and feel there is no pulmonary contraindications for surgery.  He is to continue on the same meds.  Other Orders: Est. Patient Level III (78295)   Patient Instructions: 1)  cancel upcoming apptm 2)  followup with me in 4-81mos. 3)  no change in medications 4)  I will send a note to your surgeon clearing you for surgery.

## 2010-05-16 NOTE — Letter (Signed)
Summary: Dr.Matthew Orville Govern surgery,Note  Dr.Matthew Orville Govern surgery,Note   Imported By: Beau Fanny 08/31/2009 11:10:30  _____________________________________________________________________  External Attachment:    Type:   Image     Comment:   External Document

## 2010-05-16 NOTE — Assessment & Plan Note (Signed)
Summary: acute sick visit for copd exacerbation.   Primary Provider/Referring Provider:  Shaune Leeks MD  CC:  c/o increased sob, cough with mucus, and and fatigue for 3 weeks.Marland Kitchen  History of Present Illness: The pt comes in today for an acute sick visit.  He has known emphysema with chronic respiratory failure,  and notes a 3 week history of worsening sob, cough with nonpurulent mucus, and ?wheezing.  He has not had any fever or chills.  He has been staying on his bronchodilator regimen, has been wearing his oxygen, and is participating in pulmonary rehab.    Current Medications (verified): 1)  Aspirin Ec 81 Mg Tbec (Aspirin) .... Take 2 Tablet By Mouth Once A Day 2)  Glimepiride 4 Mg Tabs (Glimepiride) .Marland Kitchen.. 1 By Mouth Once Daily 3)  Plavix 75 Mg Tabs (Clopidogrel Bisulfate) .Marland Kitchen.. 1 Daily By Mouth 4)  Oxygen 3 L .... 24/7 5)  Cozaar 50 Mg Tabs (Losartan Potassium) .... One Tab By Mouth Daily 6)  Proventil Hfa 108 (90 Base) Mcg/act Aers (Albuterol Sulfate) .... Inhale 2 Puffs Every 4 Hours As Needed 7)  Lasix 20 Mg  Tabs (Furosemide) .... Take 1 Tablet By Mouth Once A Day 8)  Klor-Con M20 20 Meq  Cr-Tabs (Potassium Chloride Crys Cr) .... Take 1 Tablet By Mouth Once A Day 9)  Zyrtec Allergy 10 Mg  Tabs (Cetirizine Hcl) .... Take 1 Tablet By Mouth Once A Day 10)  Delsym 30 Mg/67ml Lqcr (Dextromethorphan Polistirex) .... Take 2 Times Per Day As Needed 11)  Freestyle Lite Test  Strp (Glucose Blood) .... Use Once Daily To Monitor Diabetes 12)  Freestyle Lite  Devi (Blood Glucose Monitoring Suppl) .... Use Daily To Monitor Diabetes 13)  Nyquil .... Take At Bedtime 14)  Adult Gummy Vitamins .... Once Daily 15)  Vitamin D 1000 Unit Tabs (Cholecalciferol) .... Once Daily 16)  Duoneb 0.5-2.5 (3) Mg/72ml Soln (Ipratropium-Albuterol) .... One Treatment At Breakfast, Lunch, Dinner, Bedtime Everyday....can Take 2 Additional Treatments A Day If A Bad Day 17)  Budesonide 0.5 Mg/54ml Susp (Budesonide)  .... One Treatment Twice Daily 18)  Simvastatin 80 Mg Tabs (Simvastatin) .... Take 1/2  Tablet By Mouth Once A Day 19)  Hydromet 5-1.5 Mg/40ml Syrp (Hydrocodone-Homatropine) .... 1/2 Tsp Three Times A Day As Needed Cough, May Make You Sleepy /off Balance 20)  Aricept 10 Mg Tabs (Donepezil Hcl) .... One Tab By Mouth Once Daily 21)  Mucinex Dm Maximum Strength 60-1200 Mg Xr12h-Tab (Dextromethorphan-Guaifenesin) .Marland Kitchen.. 1 Two Times A Day As Needed 22)  Ipratropium Bromide 0.06 % Soln (Ipratropium Bromide) .... 2 Each Nostril Each Am 23)  Flutter Valve .... Twice A Day  Allergies (verified): 1)  ! Penicillin 2)  ! Biaxin 3)  Amoxicillin (Amoxicillin) 4)  * Penicillins Group  Past History:  Past medical, surgical, family and social histories (including risk factors) reviewed, and no changes noted (except as noted below).  Past Medical History: Reviewed history from 11/01/2009 and no changes required. BRADYCARDIA (ICD-427.89) CT, CHEST, ABNORMAL (ICD-793.1) DEMENTIA (ICD-294.8) MASS, LUNG R HILUM (ICD-786.6) SHORTNESS OF BREATH (ICD-786.05) WEAKNESS (ICD-780.79) CHRONIC RESPIRATORY FAILURE (ICD-518.83) PURE HYPERCHOLESTEROLEMIA (ICD-272.0) OBSTRUCTIVE SLEEP APNEA (ICD-327.23) ALLERGIC RHINITIS, CHRONIC (ICD-477.9) EMPHYSEMA (ICD-492.8) INGUINAL HERNIA, RIGHT (ICD-550.90) DISORDER, SKIN NEC R SHIN (ICD-709.8) COPD asthma emphysema  HEMORRHOIDS (ICD-455.6) VARICOSE VEIN, MILD (ICD-456.8) HYPERTENSION (ICD-401.9) DIABETES MELLITUS, TYPE II (ICD-250.00) CORONARY ARTERY DISEASE (ICD-414.00) COPD (ICD-496)  Past Surgical History: Reviewed history from 11/03/2008 and no changes required. Hernia repair, Left recurrent 01/00 [facility],  Cholecystitis, cholecyst, ERCP 01/26/03-01/31/2003 CATH with PICA 04/09/04 Stress/Adenosine Myoview, abnormal EF 50% 03/25/04 Left THR, anemia 2 units PRBC 02/06 [facility] R/O + MI, succ stenosis 02/20-02/23/06 CATH, acute info post MI, stent PICA  06/03/2004 Rehab in pt. 02/16-02/23/07--out pt rehab Adenosine Myoview EF 46% 08/13/05 RHR 05/31/05 ETT Myoview Adenosine - no ischemia  09/02/06 HOSP R/O'D Hilar Pulm Mass 7/17-7/19/2010 CT Angio Chest Neg PE  3x3x2.2cm R Hilar Mass 10/28/2008 PET Scan Inflam/Infect Atelect RUL w/ Reactive Lymphadenopathy 11/02/2008  Family History: Reviewed history from 05/23/2009 and no changes required. Father: Died at age 29 Mother: Died at age 52 Brother  A 58 DM HTn Chol Vision DIffs Brother dec  65Parkinson's  six sisters, 5  living  one sister with breast cancer Sister dec 65  "Cancer" HBP - self DM - self Breast cancer - sister  Social History: Reviewed history from 01/31/2009 and no changes required. 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   Review of Systems       The patient complains of shortness of breath with activity, shortness of breath at rest, productive cough, and nasal congestion/difficulty breathing through nose.  The patient denies non-productive cough, coughing up blood, chest pain, irregular heartbeats, acid heartburn, indigestion, loss of appetite, weight change, abdominal pain, difficulty swallowing, sore throat, tooth/dental problems, headaches, sneezing, itching, ear ache, anxiety, depression, hand/feet swelling, joint stiffness or pain, rash, change in color of mucus, and fever.    Vital Signs:  Patient profile:   77 year old male Height:      67 inches Weight:      184.25 pounds BMI:     28.96 O2 Sat:      83 % on 3 L/min pulsed Temp:     97.5 degrees F oral Pulse rate:   87 / minute BP sitting:   128 / 68  (left arm) Cuff size:   regular  Vitals Entered By: Carver Fila (February 08, 2010 9:04 AM)  O2 Flow:  3 L/min pulsed  CC: c/o increased sob, cough with mucus, and fatigue for 3 weeks. Comments pt o2 went up to 95% on 3 liters and heart rate 65 meds  and allergies updated Phone number updated Carver Fila  February 08, 2010 9:05 AM    Physical Exam  General:  wd male in nad Nose:  no purulence or discharge noted. Neck:  no jvd or LN Lungs:  decreased bs with a few rhonchi, no active wheezing Heart:  rrr, no mrg Extremities:  trace edema, no cyanosis  Neurologic:  alert and oriented, moves all 4.   Impression & Recommendations:  Problem # 1:  CHRONIC OBSTRUCTIVE PULMONARY DISEASE, ACUTE EXACERBATION (ICD-491.21) the pt comes in with symptoms that are suggestive of a copd exacerbation.  His sats are inadequate currently on pulsed oxygen with ambulation, and I think he would benefit from the option to change over to continuous flow when he is actively moving.  His history is not suggestive of an acute infection.  Will treat him with a short course of prednisone to see if he will improve.  He has severe disease, and he may be approaching endstage.  At some point may require hospice referral, but for now will work on improving QOL.  Medications Added to Medication List This Visit: 1)  Mucinex Dm Maximum Strength 60-1200 Mg Xr12h-tab (Dextromethorphan-guaifenesin) .Marland Kitchen.. 1 two times a day as needed 2)  Flutter Valve  .... Twice a day 3)  Prednisone 10 Mg Tabs (Prednisone) .... Take 4 each day for 2 days, then 3 each day for 2 days, then 2 each day for 2 days, then 1 each day for 2 days, then stop  Other Orders: Est. Patient Level IV (04540) DME Referral (DME)  Patient Instructions: 1)  will change your oxygen over to [place]...can flip to continuous flow when walking around, back to pulsed when sitting. 2)  will treat with a course of prednisone to see if it will help. 3)  stay in pulmonary rehab. 4)  followup with me in at previously scheduled apptm,  or sooner if not getting better.   Prescriptions: PREDNISONE 10 MG  TABS (PREDNISONE) take 4 each day for 2 days, then 3 each day for 2 days, then 2 each day for 2 days, then 1 each  day for 2 days, then stop  #20 x 0   Entered and Authorized by:   Barbaraann Share MD   Signed by:   Barbaraann Share MD on 02/08/2010   Method used:   Print then Give to Patient   RxID:   [id number]

## 2010-05-16 NOTE — Assessment & Plan Note (Signed)
Summary: 3 month F/U      Allergies Added:   Visit Type:  Follow-up Referring Provider:  Wenda Low Primary Provider:  Shaune Leeks MD  CC:  Denies chest pain.Marland Kitchen  History of Present Illness:  77 year old male with a history of coronary artery disease status post stenting of the third marginal with a nondrug-eluting stent in September 2005, complicated by stent thrombosis after discontinuation of his Plavix resulting in acute inferior and posterior myocardial infarction followed by repeat stenting with DES, long h/o COPD with h/o  COPD exacerbation episodes, also with HTN, Diabetes,  hyperlipidemia, bradycardia requiring discontinuation of his beta-blocker, bilateral hip replacement, and obstructive sleep apnea with noncompliance to CPAP.   Today he reports that he is doing well. His wife states that he is completing a prednisone taper. Prior to the taper, he was SOB and did not feel well. He denies any chest pain at rest or with exertion. He denies any cough or sputum production. He has been using a nebulizer machine at home and he believes this is better than his inhalers.   Previous EKG shows normal sinus rhythm with rate 79 beats per minute, no significant ST or T wave changes.    Current Medications (verified): 1)  Aspirin Ec 81 Mg Tbec (Aspirin) .... Take 2 Tablet By Mouth Once A Day 2)  Glimepiride 4 Mg Tabs (Glimepiride) .Marland Kitchen.. 1 By Mouth Once Daily 3)  Plavix 75 Mg Tabs (Clopidogrel Bisulfate) .Marland Kitchen.. 1 Daily By Mouth 4)  Oxygen 3 L .... 24/7 5)  Cozaar 50 Mg Tabs (Losartan Potassium) .... One Tab By Mouth Daily 6)  Proventil Hfa 108 (90 Base) Mcg/act Aers (Albuterol Sulfate) .... Inhale 2 Puffs Every 4 Hours As Needed 7)  Lasix 20 Mg  Tabs (Furosemide) .... Take 1 Tablet By Mouth Once A Day 8)  Klor-Con M20 20 Meq  Cr-Tabs (Potassium Chloride Crys Cr) .... Take 1 Tablet By Mouth Once A Day 9)  Zyrtec Allergy 10 Mg  Tabs (Cetirizine Hcl) .... Take 1 Tablet By Mouth Once A  Day 10)  Delsym 30 Mg/58ml Lqcr (Dextromethorphan Polistirex) .... Take 2 Times Per Day As Needed 11)  Freestyle Lite Test  Strp (Glucose Blood) .... Use Once Daily To Monitor Diabetes 12)  Freestyle Lite  Devi (Blood Glucose Monitoring Suppl) .... Use Daily To Monitor Diabetes 13)  Nyquil .... Take At Bedtime 14)  Adult Gummy Vitamins .... Once Daily 15)  Vitamin D 1000 Unit Tabs (Cholecalciferol) .... Once Daily 16)  Duoneb 0.5-2.5 (3) Mg/31ml Soln (Ipratropium-Albuterol) .... One Treatment At Breakfast, Lunch, Dinner, Bedtime Everyday....can Take 2 Additional Treatments A Day If A Bad Day 17)  Budesonide 0.5 Mg/52ml Susp (Budesonide) .... One Treatment Twice Daily 18)  Simvastatin 80 Mg Tabs (Simvastatin) .... Take 1/2  Tablet By Mouth Once A Day 19)  Hydromet 5-1.5 Mg/38ml Syrp (Hydrocodone-Homatropine) .... 1/2 Tsp Three Times A Day As Needed Cough, May Make You Sleepy /off Balance 20)  Aricept 10 Mg Tabs (Donepezil Hcl) .... One Tab By Mouth Once Daily 21)  Mucinex Dm Maximum Strength 60-1200 Mg Xr12h-Tab (Dextromethorphan-Guaifenesin) .Marland Kitchen.. 1 Two Times A Day As Needed 22)  Ipratropium Bromide 0.06 % Soln (Ipratropium Bromide) .... 2 Each Nostril Each Am 23)  Flutter Valve .... Twice A Day 24)  Prednisone 10 Mg  Tabs (Prednisone) .... Take 4 Each Day For 2 Days, Then 3 Each Day For 2 Days, Then 2 Each Day For 2 Days, Then 1 Each Day For 2  Days, Then Stop 25)  Melatonin 3-10 Mg Tabs (Melatonin-Pyridoxine) .... Take One By Mouth At Bedtime  Allergies (verified): 1)  ! Penicillin 2)  ! Biaxin 3)  Amoxicillin (Amoxicillin) 4)  * Penicillins Group  Past History:  Past Medical History: Last updated: 11/01/2009 BRADYCARDIA (ICD-427.89) CT, CHEST, ABNORMAL (ICD-793.1) DEMENTIA (ICD-294.8) MASS, LUNG R HILUM (ICD-786.6) SHORTNESS OF BREATH (ICD-786.05) WEAKNESS (ICD-780.79) CHRONIC RESPIRATORY FAILURE (ICD-518.83) PURE HYPERCHOLESTEROLEMIA (ICD-272.0) OBSTRUCTIVE SLEEP APNEA  (ICD-327.23) ALLERGIC RHINITIS, CHRONIC (ICD-477.9) EMPHYSEMA (ICD-492.8) INGUINAL HERNIA, RIGHT (ICD-550.90) DISORDER, SKIN NEC R SHIN (ICD-709.8) COPD asthma emphysema  HEMORRHOIDS (ICD-455.6) VARICOSE VEIN, MILD (ICD-456.8) HYPERTENSION (ICD-401.9) DIABETES MELLITUS, TYPE II (ICD-250.00) CORONARY ARTERY DISEASE (ICD-414.00) COPD (ICD-496)  Past Surgical History: Last updated: 11/03/2008 Hernia repair, Left recurrent 01/00 [facility],  Cholecystitis, cholecyst, ERCP 01/26/03-01/31/2003 CATH with PICA 04/09/04 Stress/Adenosine Myoview, abnormal EF 50% 03/25/04 Left THR, anemia 2 units PRBC 02/06 [facility] R/O + MI, succ stenosis 02/20-02/23/06 CATH, acute info post MI, stent PICA 06/03/2004 Rehab in pt. 02/16-02/23/07--out pt rehab Adenosine Myoview EF 46% 08/13/05 RHR 05/31/05 ETT Myoview Adenosine - no ischemia  09/02/06 HOSP R/O'D Hilar Pulm Mass 7/17-7/19/2010 CT Angio Chest Neg PE  3x3x2.2cm R Hilar Mass 10/28/2008 PET Scan Inflam/Infect Atelect RUL w/ Reactive Lymphadenopathy 11/02/2008  Family History: Last updated: 06-13-2009 Father: Died at age 52 Mother: Died at age 63 Brother  A 5 DM HTn Chol Vision DIffs Brother dec  65Parkinson's  six sisters, 5  living  one sister with breast cancer Sister dec 65  "Cancer" HBP - self DM - self Breast cancer - sister  Social History: Last updated: 01/31/2009 Marital Status: Married Children: 2 Occupation: Retired as a Administrator, 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   Risk Factors: Alcohol Use: 0 (06/13/09) Caffeine Use: 4 (June 13, 2009) Exercise: no (13-Jun-2009)  Risk Factors: Smoking Status: quit (09/06/2009) Packs/Day: 1.0 (2009-06-13)  Review of Systems       The patient complains of dyspnea on exertion.  The patient denies fever, weight loss, weight gain, vision loss, decreased hearing, hoarseness, chest pain, syncope, peripheral edema, prolonged cough, abdominal pain,  incontinence, muscle weakness, depression, and enlarged lymph nodes.    Vital Signs:  Patient profile:   77 year old male Height:      67 inches Weight:      183 pounds BMI:     28.77 Pulse rate:   78 / minute BP sitting:   148 / 81  (left arm) Cuff size:   regular  Vitals Entered By: Bishop Dublin, CMA (February 14, 2010 4:24 PM)  Physical Exam  General:  Elderly appearing male  in NAD  on chronic O2 via [place], pleasant and cheerful. Head:  Normocephalic and atraumatic without obvious abnormalities.  Neck:  Neck supple, no JVD. No masses, thyromegaly or abnormal cervical nodes. Lungs:  Decreased BS throughout, but no wheezing , slight rhonchi,  no rales Heart:  Non-displaced PMI, chest non-tender; regular rate and rhythm, S1, S2 without murmurs, rubs or gallops. Carotid upstroke normal, no bruit.  Pedals normal pulses. No edema, no varicosities. Abdomen:  Bowel sounds positive; abdomen soft and non-tender without masses Msk:  Back normal, normal gait. Muscle strength and tone normal. Pulses:  pulses normal in all 4 extremities Extremities:  trace edema, no cyanosis  Neurologic:  alert and oriented, moves all 4. Skin:  Intact without lesions or rashes. Psych:  Normal affect.   Impression & Recommendations:  Problem # 1:  CORONARY ARTERY DISEASE (  ICD-414.00) No angina with exertion. No further testing at this time. Will continue aggressive medical management.  His updated medication list for this problem includes:    Aspirin Ec 81 Mg Tbec (Aspirin) .Marland Kitchen... Take 2 tablet by mouth once a day    Plavix 75 Mg Tabs (Clopidogrel bisulfate) .Marland Kitchen... 1 daily by mouth  Problem # 2:  PURE HYPERCHOLESTEROLEMIA (ICD-272.0) Cholesterol is at goal. Will continue simvastatin 40 daily.  His updated medication list for this problem includes:    Simvastatin 80 Mg Tabs (Simvastatin) .Marland Kitchen... Take 1/2  tablet by mouth once a day  Problem # 3:  CHRONIC OBSTRUCTIVE PULMONARY DISEASE, ACUTE EXACERBATION  (ICD-491.21) Severe COPD with h/o exacerbations, on chronic oxygen. Followed by pulmonary. Appears to be doing well on prednisone taper.  His updated medication list for this problem includes:    Proventil Hfa 108 (90 Base) Mcg/act Aers (Albuterol sulfate) ..... Inhale 2 puffs every 4 hours as needed    Duoneb 0.5-2.5 (3) Mg/44ml Soln (Ipratropium-albuterol) ..... One treatment at breakfast, lunch, dinner, bedtime everyday....can take 2 additional treatments a day if a bad day    Budesonide 0.5 Mg/58ml Susp (Budesonide) ..... One treatment twice daily    Prednisone 10 Mg Tabs (Prednisone) .Marland Kitchen... Take 4 each day for 2 days, then 3 each day for 2 days, then 2 each day for 2 days, then 1 each day for 2 days, then stop  Appended Document: 3 month F/U COPD/lung damage is secondary to remote smoking and working for decades in a [employer].

## 2010-05-16 NOTE — Miscellaneous (Signed)
Summary: [facility]-Statement of Medical Necessity   [facility]-Statement of Medical Necessity   Imported By: Beau Fanny 07/20/2009 09:52:02  _____________________________________________________________________  External Attachment:    Type:   Image     Comment:   External Document

## 2010-05-16 NOTE — Progress Notes (Signed)
Summary: rx for sleep?  Phone Note Call from Patient Call back at [phone]   Caller: Other Relative-[name] Burditt Call For: clance Reason for Call: Talk to Nurse Summary of Call: started meds and neb treatments and now he isn't sleeping well.  Would like to know if Meatonin would help or if they should try something else? [facility] [address] [place] Initial call taken by: Eugene Gavia,  Sep 11, 2009 9:44 AM  Follow-up for Phone Call        Pt seen by TP 5/26--C/o not being able to sleep x 4 days. Pt started new medications Levaquin, Prednisone, and Hydromet. Breathing has improved, sounds better, cough reduced, intermittent productive cough with clear mucus. Pt c/o elevated fasting blood sugar readings with highest being 238, yesterday was 162 fasting. Feeling weak. Pt is eating with appetite returning to normal and staying hydrated. Pt states has taken Hydromet. Pt will finish Levaquin tomorrow and Prednisone will be finished within the next few days. Pt is napping some during the day. Have not been able to notice if there is a difference with neb treatments. Please advise. Zackery Barefoot CMA  Sep 11, 2009 10:23 AM   Additional Follow-up for Phone Call Additional follow up Details #1::        not uncommon to have restlessness w/ steroids,  sugars should come down w/ lower dose of steroids.  may use melatonin if needed also cough syrup may help w/ sleep.  Additional Follow-up by: Rubye Oaks NP,  Sep 11, 2009 12:12 PM    Additional Follow-up for Phone Call Additional follow up Details #2::    Pt relative, [name] advised of recs.Carron Curie CMA  Sep 11, 2009 12:23 PM

## 2010-05-16 NOTE — Assessment & Plan Note (Signed)
Summary: CLEARANCE FOR SURGERY/CLE   Vital Signs:  Patient profile:   77 year old male Weight:      180.50 pounds O2 Sat:      97 % on 2 L/min Temp:     98.2 degrees F oral Pulse rate:   60 / minute Pulse rhythm:   regular BP sitting:   110 / 60  (right arm) Cuff size:   regular  Vitals Entered By: Sydell Axon LPN (Aug 29, 2009 11:03 AM)  O2 Flow:  2 L/min CC: Needs clearance for surgery   History of Present Illness: Pt here with daughter for clearance for hernia repair. He has already been cleared by Pulmonology and Cardiology. He has had hernia repair twice previously, first via classic repair and second with mesh. It is assumed minimal anesthesia will be used as the pt has had general anesthesia multiple times. He is his usual dry humored self and is in his typical great spirits.  He had had an episode of pain requiring visit to ER which precipitated having the repair accomplished. He has had no problems since that time.  Problems Prior to Update: 1)  Hyperlipidemia-mixed  (ICD-272.4) 2)  Emphysema  (ICD-492.8) 3)  Bradycardia  (ICD-427.89) 4)  Ct, Chest, Abnormal  (ICD-793.1) 5)  Dementia  (ICD-294.8) 6)  Mass, Lung R Hilum  (ICD-786.6) 7)  Shortness of Breath  (ICD-786.05) 8)  Weakness  (ICD-780.79) 9)  Chronic Respiratory Failure  (ICD-518.83) 10)  Pure Hypercholesterolemia  (ICD-272.0) 11)  Obstructive Sleep Apnea  (ICD-327.23) 12)  Allergic Rhinitis, Chronic  (ICD-477.9) 13)  Special Screening Malig Neoplasms Other Sites  (ICD-V76.49) 14)  Inguinal Hernia, Right  (ICD-550.90) 15)  Disorder, Skin Nec R Shin  (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 Take 1 Tablet By Mouth Once A Day 2)  Glimepiride 4 Mg Tabs (Glimepiride) .Marland Kitchen.. 1 By Mouth  Once Daily 3)  Plavix 75 Mg Tabs (Clopidogrel Bisulfate) .Marland Kitchen.. 1 Daily By Mouth 4)  Oxygen 2 L .... 24/7 5)  Lisinopril 5 Mg  Tabs (Lisinopril) .Marland Kitchen.. 1 By Mouth Daily 6)  Proventil Hfa 108 (90 Base) Mcg/act Aers (Albuterol Sulfate) .... Inhale 2 Puffs Every 4 Hours As Needed 7)  Lasix 20 Mg  Tabs (Furosemide) .... Take 1 Tablet By Mouth Once A Day 8)  Klor-Con M20 20 Meq  Cr-Tabs (Potassium Chloride Crys Cr) .... Take 1 Tablet By Mouth Once A Day 9)  Zyrtec Allergy 10 Mg  Tabs (Cetirizine Hcl) .... Take 1 Tablet By Mouth Once A Day 10)  Delsym 30 Mg/15ml Lqcr (Dextromethorphan Polistirex) .... Take 2 Times Per Day As Needed 11)  Freestyle Lite Test  Strp (Glucose Blood) .... Use Once Daily To Monitor Diabetes 12)  Freestyle Lite  Devi (Blood Glucose Monitoring Suppl) .... Use Daily To Monitor Diabetes 13)  Nyquil .... Take At Bedtime 14)  Adult Gummy Vitamins .... Once Daily 15)  Vitamin D 1000 Unit Tabs (Cholecalciferol) .... Once Daily 16)  Duoneb 0.5-2.5 (3) Mg/44ml Soln (Ipratropium-Albuterol) .... One Treatment At Breakfast, Lunch, Dinner, Bedtime Everyday....can Take 2 Additional Treatments A Day If A Bad Day 17)  Budesonide 0.5 Mg/55ml Susp (Budesonide) .... One Treatment Twice Daily 18)  Simvastatin 80 Mg Tabs (Simvastatin) .... Take 1 Tablet By  Mouth Once A Day  Allergies: 1)  ! Penicillin 2)  ! Biaxin 3)  Amoxicillin (Amoxicillin) 4)  * Penicillins Group  Past History:  Past Medical History: Last updated: 01/31/2009 BRADYCARDIA (ICD-427.89) CT, CHEST, ABNORMAL (ICD-793.1) DEMENTIA (ICD-294.8) MASS, LUNG R HILUM (ICD-786.6) SHORTNESS OF BREATH (ICD-786.05) WEAKNESS (ICD-780.79) CHRONIC RESPIRATORY FAILURE (ICD-518.83) PURE HYPERCHOLESTEROLEMIA (ICD-272.0) OBSTRUCTIVE SLEEP APNEA (ICD-327.23) ALLERGIC RHINITIS, CHRONIC (ICD-477.9) EMPHYSEMA (ICD-492.8) INGUINAL HERNIA, RIGHT (ICD-550.90) DISORDER, SKIN NEC R SHIN (ICD-709.8) HEMORRHOIDS (ICD-455.6) VARICOSE VEIN, MILD  (ICD-456.8) HYPERTENSION (ICD-401.9) DIABETES MELLITUS, TYPE II (ICD-250.00) CORONARY ARTERY DISEASE (ICD-414.00) COPD (ICD-496)  Past Surgical History: Last updated: 11/03/2008 Hernia repair, Left recurrent 01/00 [facility],  Cholecystitis, cholecyst, ERCP 01/26/03-01/31/2003 CATH with PICA 04/09/04 Stress/Adenosine Myoview, abnormal EF 50% 03/25/04 Left THR, anemia 2 units PRBC 02/06 [facility] R/O + MI, succ stenosis 02/20-02/23/06 CATH, acute info post MI, stent PICA 06/03/2004 Rehab in pt. 02/16-02/23/07--out pt rehab Adenosine Myoview EF 46% 08/13/05 RHR 05/31/05 ETT Myoview Adenosine - no ischemia  09/02/06 HOSP R/O'D [facility] Pulm Mass 7/17-7/19/2010 CT Angio Chest Neg PE  3x3x2.2cm R Hilar Mass 10/28/2008 PET Scan Inflam/Infect Atelect RUL w/ Reactive Lymphadenopathy 11/02/2008  Family History: Last updated: 06/10/09 Father: Died at age 89 Mother: Died at age 23 Brother  A 34 DM HTn Chol Vision DIffs Brother dec  65Parkinson's  six sisters, 5  living  one sister with breast cancer Sister dec 65  "Cancer" HBP - self DM - self Breast cancer - sister  Social History: Last updated: 01/31/2009 Marital Status: Married Children: 2 Occupation: Retired as a Administrator, 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   Risk Factors: Alcohol Use: 0 (06/10/09) Caffeine Use: 4 (June 10, 2009) Exercise: no (10-Jun-2009)  Risk Factors: Smoking Status: quit (2009-06-10) Packs/Day: 1.0 (Jun 10, 2009)  Physical Exam  General:  Elderly appearing male  in NAD non toxic appearing and on chronic O2 via [place], pleasant and cheerful. Head:  Normocephalic and atraumatic without obvious abnormalities. No apparent alopecia but some thinning/ balding. Eyes:  Conjunctiva clear bilaterally.  Ears:  External ear exam shows no significant lesions or deformities.  Otoscopic examination reveals clear canals, tympanic membranes are intact bilaterally without bulging, retraction,  inflammation or discharge. Hearing is grossly decreased  bilaterally. Nose:  External nasal examination shows no deformity or inflammation. Nasal mucosa are pink and moist without lesions or exudates. [place] in place. Mouth:  Oral mucosa and oropharynx without lesions or exudates.  Teeth in good repair. Neck:  no carotid bruit or thyromegaly no cervical or supraclavicular lymphadenopathy  Chest Wall:  No deformities, masses, tenderness or gynecomastia noted. Breasts:  No masses or gynecomastia noted Lungs:  Decreased BS throughout, but no wheezing ,no rhonchi. no rales Heart:  normal rate, regular rhythm, no murmur, no gallop, and no JVD.   Abdomen:  Bowel sounds positive,abdomen soft and non-tender without masses, organomegaly with chronic right inguinal hernia descended noted, no acute tenderness and easily reducible, still.. Msk:  No deformity or scoliosis noted of thoracic or lumbar spine.   Pulses:  R and L posterior tibial pulses are full and equal bilaterally  Extremities:  No clubbing, cyanosis, edema..   Neurologic:  No cranial nerve deficits noted. Station and gait are normal for age and wide-based. Sensory, motor and coordinative functions appear intact. Skin:  Intact without suspicious lesions or rashes, Aks and SKs appropriate for age. Cervical Nodes:  No lymphadenopathy noted Inguinal Nodes:  No significant adenopathy Psych:  Cognition and judgment appear intact. Alert and  cooperative with normal attention span and concentration. No apparent delusions, illusions, hallucinations   Impression & Recommendations:  Problem # 1:  PREOPERATIVE EXAMINATION (ICD-V72.84) Assessment Comment Only Pt cleared for surgical repair of right inguinal hernia.  Pt already cleared by Cardiology and Pulmonology. Minimal anesthesia to be used.  Pt is very stable  today.  Problem # 2:  DIABETES MELLITUS, TYPE II (ICD-250.00)  May have pt hold Glimepiride the morning of surgery. Diabetes well  controlled. His updated medication list for this problem includes:    Aspirin Ec 81 Mg Tbec (Aspirin) .Marland Kitchen... Take 2 tablet by mouth once a day    Glimepiride 4 Mg Tabs (Glimepiride) .Marland Kitchen... 1 by mouth once daily    Lisinopril 5 Mg Tabs (Lisinopril) .Marland Kitchen... 1 by mouth daily  Labs Reviewed: Creat: 1.2 (05/21/2009)   Microalbumin: 5.6 (04/29/2006)  Last Eye Exam: normal (07/30/2009) Reviewed HgBA1c results: 6.8 (05/21/2009)  6.4 (04/25/2008)  Problem # 3:  HYPERTENSION (ICD-401.9) Assessment: Unchanged BP well controlled. May hold med day of surgery. His updated medication list for this problem includes:    Lisinopril 5 Mg Tabs (Lisinopril) .Marland Kitchen... 1 by mouth daily    Lasix 20 Mg Tabs (Furosemide) .Marland Kitchen... Take 1 tablet by mouth once a day  BP today: 110/60 Prior BP: 128/60 (08/24/2009)  Labs Reviewed: K+: 4.1 (05/21/2009) Creat: : 1.2 (05/21/2009)   Chol: 126 (05/21/2009)   HDL: 54.40 (05/21/2009)   LDL: 56 (05/21/2009)   TG: 78.0 (05/21/2009)  Complete Medication List: 1)  Aspirin Ec 81 Mg Tbec (Aspirin) .... Take 2 tablet by mouth once a day 2)  Glimepiride 4 Mg Tabs (Glimepiride) .Marland Kitchen.. 1 by mouth once daily 3)  Plavix 75 Mg Tabs (Clopidogrel bisulfate) .Marland Kitchen.. 1 daily by mouth 4)  Oxygen 2 L  .... 24/7 5)  Lisinopril 5 Mg Tabs (Lisinopril) .Marland Kitchen.. 1 by mouth daily 6)  Proventil Hfa 108 (90 Base) Mcg/act Aers (Albuterol sulfate) .... Inhale 2 puffs every 4 hours as needed 7)  Lasix 20 Mg Tabs (Furosemide) .... Take 1 tablet by mouth once a day 8)  Klor-con M20 20 Meq Cr-tabs (Potassium chloride crys cr) .... Take 1 tablet by mouth once a day 9)  Zyrtec Allergy 10 Mg Tabs (Cetirizine hcl) .... Take 1 tablet by mouth once a day 10)  Delsym 30 Mg/67ml Lqcr (Dextromethorphan polistirex) .... Take 2 times per day as needed 11)  Freestyle Lite Test Strp (Glucose blood) .... Use once daily to monitor diabetes 12)  Freestyle Lite Devi (Blood glucose monitoring suppl) .... Use daily to monitor  diabetes 13)  Nyquil  .... Take at bedtime 14)  Adult Gummy Vitamins  .... Once daily 15)  Vitamin D 1000 Unit Tabs (Cholecalciferol) .... Once daily 16)  Duoneb 0.5-2.5 (3) Mg/74ml Soln (Ipratropium-albuterol) .... One treatment at breakfast, lunch, dinner, bedtime everyday....can take 2 additional treatments a day if a bad day 17)  Budesonide 0.5 Mg/48ml Susp (Budesonide) .... One treatment twice daily 18)  Simvastatin 80 Mg Tabs (Simvastatin) .... Take 1 tablet by mouth once a day  Patient Instructions: 1)  Cleared for Surgery.  Current Allergies (reviewed today): ! PENICILLIN ! BIAXIN AMOXICILLIN (AMOXICILLIN) * PENICILLINS GROUP

## 2010-05-16 NOTE — Progress Notes (Signed)
Summary: samples of lipitor given  Phone Note Call from Patient   Caller: Spouse Summary of Call: Pt's wife requests samples of lipitor and plavix.  No plavix available.  Samples of lipitor 40 mg given, 4 boxes of # 7 each. Initial call taken by: Lowella Petties CMA,  July 03, 2009 4:27 PM  Follow-up for Phone Call        Thank you. Follow-up by: Shaune Leeks MD,  July 03, 2009 8:28 PM

## 2010-05-16 NOTE — Progress Notes (Signed)
Summary: SAMPLES   Phone Note Call from Patient Call back at Home Phone [phone]   Caller: SELF Call For: [name] Summary of Call: WOULD LIKE PLAVIX 75 MG AND LIPITOR 40 MG SAMPLES PLEASE Initial call taken by: Harlon Flor,  June 27, 2009 9:49 AM  Follow-up for Phone Call        have samples of plavix- will leave at desk.  Follow-up by: Charlena Cross, RN, BSN,  June 27, 2009 10:01 AM

## 2010-05-16 NOTE — Assessment & Plan Note (Signed)
Summary: CHECK UP/CLE   Vital Signs:  Patient profile:   77 year old male Weight:      185 pounds O2 Sat:      97 % on 2 L/min Temp:     97.4 degrees F oral Pulse rate:   76 / minute Pulse rhythm:   regular BP sitting:   118 / 70  (left arm) Cuff size:   regular  Vitals Entered By: Sydell Axon LPN (May 23, 2009 2:48 PM)  O2 Flow:  2 L/min CC: 30 Minute checkup, hemoccult cards given to patient   History of Present Illness: Pt here for followup. Has been seen recently for regular f/u with Pulm and Cards. He feels well without complaint. He uses O2 all the time.He is sleeping ok.  Hilar mass felt to be inflammatory. Followed by Pulm.   Preventive Screening-Counseling & Management  Alcohol-Tobacco     Alcohol drinks/day: 0     Smoking Status: quit     Packs/Day: 1.0     Year Quit: 1961     Pack years: 15  Caffeine-Diet-Exercise     Caffeine use/day: 4     Does Patient Exercise: no     Type of exercise: messes around     Times/week: 5  Problems Prior to Update: 1)  Bradycardia  (ICD-427.89) 2)  Ct, Chest, Abnormal  (ICD-793.1) 3)  Dementia  (ICD-294.8) 4)  Mass, Lung R [place]  (ICD-786.6) 5)  Shortness of Breath  (ICD-786.05) 6)  Weakness  (ICD-780.79) 7)  Chronic Respiratory Failure  (ICD-518.83) 8)  Pure Hypercholesterolemia  (ICD-272.0) 9)  Obstructive Sleep Apnea  (ICD-327.23) 10)  Allergic Rhinitis, Chronic  (ICD-477.9) 11)  Emphysema  (ICD-492.8) 12)  Special Screening Malig Neoplasms Other Sites  (ICD-V76.49) 13)  Inguinal Hernia, Right  (ICD-550.90) 14)  Disorder, Skin Nec R Shin  (ICD-709.8) 15)  Hemorrhoids  (ICD-455.6) 16)  Varicose Vein, Mild  (ICD-456.8) 17)  Hypertension  (ICD-401.9) 18)  Diabetes Mellitus, Type II  (ICD-250.00) 19)  Coronary Artery Disease  (ICD-414.00) 20)  COPD  (ICD-496)  Medications Prior to Update: 1)  Aspirin Ec 81 Mg Tbec (Aspirin) .... Take 1 Tablet By Mouth Once A Day 2)  Glimepiride 4 Mg Tabs (Glimepiride) .Marland Kitchen.. 1  By Mouth Once Daily 3)  Plavix 75 Mg Tabs (Clopidogrel Bisulfate) .Marland Kitchen.. 1 Daily By Mouth 4)  Oxygen 2 L .... 24/7 5)  Lisinopril 5 Mg  Tabs (Lisinopril) .Marland Kitchen.. 1 By Mouth Daily 6)  Lipitor 40 Mg Tabs (Atorvastatin Calcium) .... Take 1 Tablet By Mouth Once A Day 7)  Symbicort 160-4.5 Mcg/act  Aero (Budesonide-Formoterol Fumarate) .... 2 Puffs Twice Daily 8)  Proventil Hfa 108 (90 Base) Mcg/act Aers (Albuterol Sulfate) .... Inhale 2 Puffs Every 4 Hours As Needed 9)  Lasix 20 Mg  Tabs (Furosemide) .... Take 1 Tablet By Mouth Once A Day 10)  Klor-Con M20 20 Meq  Cr-Tabs (Potassium Chloride Crys Cr) .... Take 1 Tablet By Mouth Once A Day 11)  Zyrtec Allergy 10 Mg  Tabs (Cetirizine Hcl) .... Take 1 Tablet By Mouth Once A Day 12)  Delsym 30 Mg/55ml Lqcr (Dextromethorphan Polistirex) .... Take 2 Times Per Day As Needed 13)  Freestyle Lite Test  Strp (Glucose Blood) .... Use 1-2 Daily To Monitor Diabetes 14)  Freestyle Lite  Devi (Blood Glucose Monitoring Suppl) .... Use Daily To Monitor Diabetes 15)  Nyquil .... Take At Bedtime 16)  Adult Gummy Vitamins .... Once Daily 17)  Vitamin D  1000 Unit Tabs (Cholecalciferol) .... Once Daily  Allergies: 1)  ! Penicillin 2)  ! Biaxin 3)  Amoxicillin (Amoxicillin) 4)  * Penicillins Group  Past History:  Past Medical History: Last updated: 01/31/2009 BRADYCARDIA (ICD-427.89) CT, CHEST, ABNORMAL (ICD-793.1) DEMENTIA (ICD-294.8) MASS, LUNG R HILUM (ICD-786.6) SHORTNESS OF BREATH (ICD-786.05) WEAKNESS (ICD-780.79) CHRONIC RESPIRATORY FAILURE (ICD-518.83) PURE HYPERCHOLESTEROLEMIA (ICD-272.0) OBSTRUCTIVE SLEEP APNEA (ICD-327.23) ALLERGIC RHINITIS, CHRONIC (ICD-477.9) EMPHYSEMA (ICD-492.8) INGUINAL HERNIA, RIGHT (ICD-550.90) DISORDER, SKIN NEC R SHIN (ICD-709.8) HEMORRHOIDS (ICD-455.6) VARICOSE VEIN, MILD (ICD-456.8) HYPERTENSION (ICD-401.9) DIABETES MELLITUS, TYPE II (ICD-250.00) CORONARY ARTERY DISEASE (ICD-414.00) COPD (ICD-496)  Past Surgical  History: Last updated: 11/03/2008 Hernia repair, Left recurrent 01/00 [facility],  Cholecystitis, cholecyst, ERCP 01/26/03-01/31/2003 CATH with PICA 04/09/04 Stress/Adenosine Myoview, abnormal EF 50% 03/25/04 Left THR, anemia 2 units PRBC 02/06 [facility] R/O + MI, succ stenosis 02/20-02/23/06 CATH, acute info post MI, stent PICA 06/03/2004 Rehab in pt. 02/16-02/23/07--out pt rehab Adenosine Myoview EF 46% 08/13/05 RHR 05/31/05 ETT Myoview Adenosine - no ischemia  09/02/06 HOSP R/O'D Hilar Pulm Mass 7/17-7/19/2010 CT Angio Chest Neg PE  3x3x2.2cm R Hilar Mass 10/28/2008 PET Scan Inflam/Infect Atelect RUL w/ Reactive Lymphadenopathy 11/02/2008  Family History: Last updated: June 12, 2009 Father: Died at age 60 Mother: Died at age 86 Brother  A 67 DM HTn Chol Vision DIffs Brother dec  65Parkinson's  six sisters, 5  living  one sister with breast cancer Sister dec 65  "Cancer" HBP - self DM - self Breast cancer - sister  Social History: Last updated: 01/31/2009 Marital Status: Married Children: 2 Occupation: Retired as a Administrator, 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   Risk Factors: Alcohol Use: 0 (2009-06-12) Caffeine Use: 4 (12-Jun-2009) Exercise: no (2009-06-12)  Risk Factors: Smoking Status: quit (06-12-09) Packs/Day: 1.0 (06-12-09)  Family History: Father: Died at age 64 Mother: Died at age 35 Brother  A 79 DM HTn Chol Vision DIffs Brother dec  65Parkinson's  six sisters, 5  living  one sister with breast cancer Sister dec 65  "Cancer" HBP - self DM - self Breast cancer - sister  Social History: Packs/Day:  1.0  Review of Systems General:  Complains of weakness; denies chills, fatigue, fever, sweats, and weight loss. Eyes:  Denies blurring, discharge, and eye pain. ENT:  Complains of decreased hearing; denies ear discharge and earache; stable. CV:  Denies chest pain or discomfort, fainting, fatigue, shortness of breath with  exertion, and swelling of feet. Resp:  Complains of cough, shortness of breath, and wheezing. GI:  Complains of constipation; denies abdominal pain, bloody stools, change in bowel habits, dark tarry stools, diarrhea, hemorrhoids, indigestion, loss of appetite, nausea, vomiting, vomiting blood, and yellowish skin color. GU:  Denies discharge, dysuria, and nocturia. MS:  Denies joint pain, low back pain, muscle aches, cramps, and stiffness. Derm:  Complains of dryness; denies itching and rash. Neuro:  Denies numbness, poor balance, tingling, and tremors.  Physical Exam  General:  Elderly appearing male  in NAD non toxic appearing and on chronic O2 via [place], pleasant and cheerful. Head:  Normocephalic and atraumatic without obvious abnormalities. No apparent alopecia but some thinning/ balding. Eyes:  Conjunctiva clear bilaterally.  Ears:  External ear exam shows no significant lesions or deformities.  Otoscopic examination reveals clear canals, tympanic membranes are intact bilaterally without bulging, retraction, inflammation or discharge. Hearing is grossly decreased  bilaterally. Nose:  External nasal examination shows no deformity or inflammation. Nasal mucosa are pink and moist without lesions  or exudates. [place] in place. Mouth:  Oral mucosa and oropharynx without lesions or exudates.  Teeth in good repair. Neck:  no carotid bruit or thyromegaly no cervical or supraclavicular lymphadenopathy  Chest Wall:  No deformities, masses, tenderness or gynecomastia noted. Breasts:  No masses or gynecomastia noted Lungs:  Decreased BS throughout, but no wheezing ,no rhonchi. no rales Heart:  normal rate, regular rhythm, no murmur, no gallop, and no JVD.   Abdomen:  Bowel sounds positive,abdomen soft and non-tender without masses, organomegaly with chronic right inguinal hernia descended noted. Rectal:  No external abnormalities noted. Normal sphincter tone. No rectal masses or tenderness. G neg. Genitalia:   Testes bilaterally descended without nodularity, tenderness or masses. No scrotal masses or lesions. No penis lesions or urethral discharge. Prostate:  Prostate gland firm and smooth, no enlargement, nodularity, tenderness, mass, asymmetry or induration. 20-30gms. Msk:  No deformity or scoliosis noted of thoracic or lumbar spine.   Pulses:  R and L posterior tibial pulses are full and equal bilaterally  Extremities:  No clubbing, cyanosis, edema..   Neurologic:  No cranial nerve deficits noted. Station and gait are normal for age and wide-based. Sensory, motor and coordinative functions appear intact. Skin:  Intact without suspicious lesions or rashes, Aks and SKs appropriate for age. Cervical Nodes:  No lymphadenopathy noted Inguinal Nodes:  No significant adenopathy Psych:  Cognition and judgment appear intact. Alert and cooperative with normal attention span and concentration. No apparent delusions, illusions, hallucinations  Diabetes Management Exam:    Eye Exam:       Eye Exam done elsewhere          Date: 01/07/2008          Results: normal          Done by: [facility]   Impression & Recommendations:  Problem # 1:  DIABETES MELLITUS, TYPE II (ICD-250.00) Assessment Unchanged Stable but reiterated avoiding sweets and carbs as much as poss. His updated medication list for this problem includes:    Aspirin Ec 81 Mg Tbec (Aspirin) .Marland Kitchen... Take 1 tablet by mouth once a day    Glimepiride 4 Mg Tabs (Glimepiride) .Marland Kitchen... 1 by mouth once daily    Lisinopril 5 Mg Tabs (Lisinopril) .Marland Kitchen... 1 by mouth daily  Labs Reviewed: Creat: 1.2 (05/21/2009)   Microalbumin: 5.6 (04/29/2006)  Last Eye Exam: normal (01/07/2008) Reviewed HgBA1c results: 6.8 (05/21/2009)  6.4 (04/25/2008)  Problem # 2:  HYPERTENSION (ICD-401.9) Assessment: Unchanged Stable. His updated medication list for this problem includes:    Lisinopril 5 Mg Tabs (Lisinopril) .Marland Kitchen... 1 by mouth daily    Lasix 20 Mg Tabs (Furosemide)  .Marland Kitchen... Take 1 tablet by mouth once a day  BP today: 118/70 Prior BP: 120/60 (05/09/2009)  Labs Reviewed: K+: 4.1 (05/21/2009) Creat: : 1.2 (05/21/2009)   Chol: 126 (05/21/2009)   HDL: 54.40 (05/21/2009)   LDL: 56 (05/21/2009)   TG: 78.0 (05/21/2009)  Problem # 3:  COPD (ICD-496) Assessment: Unchanged O2 dependent but tolerating well. His updated medication list for this problem includes:    Symbicort 160-4.5 Mcg/act Aero (Budesonide-formoterol fumarate) .Marland Kitchen... 2 puffs twice daily    Proventil Hfa 108 (90 Base) Mcg/act Aers (Albuterol sulfate) ..... Inhale 2 puffs every 4 hours as needed  Pulmonary Functions Reviewed: O2 sat: 97 (05/23/2009)     Vaccines Reviewed: Pneumovax: Pneumovax (01/22/1998)   Flu Vax: Fluvax 3+ (01/09/2009)  Problem # 4:  MASS, LUNG R HILUM (ICD-786.6) Assessment: Unchanged Followed by Pulm and felt  to be inflamm at this point.  Problem # 5:  SHORTNESS OF BREATH (ICD-786.05) Assessment: Improved Better on O2. Continue.  Problem # 6:  PURE HYPERCHOLESTEROLEMIA (ICD-272.0) Assessment: Improved Pretty good nos. Cont Lipitor. His updated medication list for this problem includes:    Lipitor 40 Mg Tabs (Atorvastatin calcium) .Marland Kitchen... Take 1 tablet by mouth once a day  Labs Reviewed: SGOT: 23 (05/21/2009)   SGPT: 22 (05/21/2009)   HDL:54.40 (05/21/2009), 48.7 (04/25/2008)  LDL:56 (05/21/2009), 84 (04/25/2008)  Chol:126 (05/21/2009), 157 (04/25/2008)  Trig:78.0 (05/21/2009), 120 (04/25/2008)  Problem # 7:  INGUINAL HERNIA, RIGHT (ICD-550.90) Assessment: Unchanged Stable. Declines referral.  Complete Medication List: 1)  Aspirin Ec 81 Mg Tbec (Aspirin) .... Take 1 tablet by mouth once a day 2)  Glimepiride 4 Mg Tabs (Glimepiride) .Marland Kitchen.. 1 by mouth once daily 3)  Plavix 75 Mg Tabs (Clopidogrel bisulfate) .Marland Kitchen.. 1 daily by mouth 4)  Oxygen 2 L  .... 24/7 5)  Lisinopril 5 Mg Tabs (Lisinopril) .Marland Kitchen.. 1 by mouth daily 6)  Lipitor 40 Mg Tabs (Atorvastatin calcium) ....  Take 1 tablet by mouth once a day 7)  Symbicort 160-4.5 Mcg/act Aero (Budesonide-formoterol fumarate) .... 2 puffs twice daily 8)  Proventil Hfa 108 (90 Base) Mcg/act Aers (Albuterol sulfate) .... Inhale 2 puffs every 4 hours as needed 9)  Lasix 20 Mg Tabs (Furosemide) .... Take 1 tablet by mouth once a day 10)  Klor-con M20 20 Meq Cr-tabs (Potassium chloride crys cr) .... Take 1 tablet by mouth once a day 11)  Zyrtec Allergy 10 Mg Tabs (Cetirizine hcl) .... Take 1 tablet by mouth once a day 12)  Delsym 30 Mg/38ml Lqcr (Dextromethorphan polistirex) .... Take 2 times per day as needed 13)  Freestyle Lite Test Strp (Glucose blood) .... Use once daily to monitor diabetes 14)  Freestyle Lite Devi (Blood glucose monitoring suppl) .... Use daily to monitor diabetes 15)  Nyquil  .... Take at bedtime 16)  Adult Gummy Vitamins  .... Once daily 17)  Vitamin D 1000 Unit Tabs (Cholecalciferol) .... Once daily  Patient Instructions: 1)  Get eye exam. Prescriptions: FREESTYLE LITE TEST  STRP (GLUCOSE BLOOD) use once daily to monitor diabetes  #50 x PRN   Entered by:   Sydell Axon LPN   Authorized by:   Shaune Leeks MD   Signed by:   Sydell Axon LPN on 14/78/2956   Method used:   Print then Give to Patient   RxID:   [id number]   Current Allergies (reviewed today): ! PENICILLIN ! BIAXIN AMOXICILLIN (AMOXICILLIN) * PENICILLINS GROUP

## 2010-05-16 NOTE — Progress Notes (Signed)
Summary: neb  Phone Note Call from Patient Call back at [phone]   Caller: Daughter in law-[name] Call For: [name] Reason for Call: Talk to Nurse Summary of Call: sob, more weak and wiped out.  Want to know if [facility] will prescribe Neb treatments.  He uses his inhalers.  Panting when he walks a small distance. Initial call taken by: Eugene Gavia,  Aug 15, 2009 4:59 PM  Follow-up for Phone Call        called spoke with bonnie who states pt has had increased SOB w/  activity x2-3wks, uses proventil for as needed wheezing.  at 03-2009 ov nebs were discussed.  please advise if you would like pt to begin nebs, thanks!  bonnie aware she may not receive a call back today. Boone Master CNA  Aug 15, 2009 5:09 PM   Additional Follow-up for Phone Call Additional follow up Details #1::        we can change to nebs, but is he having a flareup of his lung disease?  He may need a short course of prednisone.  Is he having chest congestion and discolored mucus.   Additional Follow-up by: Barbaraann Share MD,  Aug 15, 2009 5:48 PM    Additional Follow-up for Phone Call Additional follow up Details #2::    Spoke with Daughter-in-law [name] and she states pt is not havign any increased congestion and does not have a cough. Please advise what nebs you want and directions, pt uses [facility]. Thanks.  Carron Curie CMA  Aug 16, 2009 9:12 AM   Additional Follow-up for Phone Call Additional follow up Details #3:: Details for Additional Follow-up Action Taken: pt to stop symbicort  duoneb one treatment at breakfast, lunch, dinner, bedtime everyday....can take 2 additional treatments a day if a bad day budesonide 0.5mg  added to treatments twice a day (am and pm) Additional Follow-up by: Barbaraann Share MD,  Aug 16, 2009 5:57 PM  New/Updated Medications: DUONEB 0.5-2.5 (3) MG/3ML SOLN (IPRATROPIUM-ALBUTEROL) one treatment at breakfast, lunch, dinner, bedtime everyday....can take 2 additional treatments a day if a  bad day BUDESONIDE 0.5 MG/2ML SUSP (BUDESONIDE) one treatment twice daily Prescriptions: BUDESONIDE 0.5 MG/2ML SUSP (BUDESONIDE) one treatment twice daily  #60 x 3   Entered by:   Carron Curie CMA   Authorized by:   Barbaraann Share MD   Signed by:   Carron Curie CMA on 08/17/2009   Method used:   Print then Give to Patient   RxID:   [id number] DUONEB 0.5-2.5 (3) MG/3ML SOLN (IPRATROPIUM-ALBUTEROL) one treatment at breakfast, lunch, dinner, bedtime everyday....can take 2 additional treatments a day if a bad day  #180 x 3   Entered by:   Carron Curie CMA   Authorized by:   Barbaraann Share MD   Signed by:   Carron Curie CMA on 08/17/2009   Method used:   Print then Give to Patient   RxID:   [id number]  pt daughter in law advised of recs, order placed, rx pritned. Carron Curie CMA  Aug 17, 2009 9:10 AM

## 2010-05-16 NOTE — Letter (Signed)
Summary: CMN for Nebulizer Supplies/[facility]  CMN for Nebulizer Supplies/[facility]   Imported By: Sherian Rein 09/18/2009 10:36:38  _____________________________________________________________________  External Attachment:    Type:   Image     Comment:   External Document

## 2010-05-16 NOTE — Assessment & Plan Note (Signed)
Summary: rov for emphysema   Primary Provider/Referring Provider:  Hetty Ely  CC:  4 mo follow up.  states breathing is doing well overall and requesting info from [facility] to be completed for pt's o2. .  History of Present Illness: the pt comes in today for f/u of his known emphysema and to requalify for oxygen.  He is maintaining his usual baseline, and denies any current cough, congestion, or worsening sob.  He did have a "chest cold" a few weeks ago, but feels that it has resolved.  He is trying to stay active, and has been compliant with his meds and oxygen.  Current Medications (verified): 1)  Aspirin Ec 81 Mg Tbec (Aspirin) .... Take 1 Tablet By Mouth Once A Day 2)  Glimepiride 4 Mg Tabs (Glimepiride) .Marland Kitchen.. 1 By Mouth Once Daily 3)  Plavix 75 Mg Tabs (Clopidogrel Bisulfate) .Marland Kitchen.. 1 Daily By Mouth 4)  Oxygen 2 L .... 24/7 5)  Lisinopril 5 Mg  Tabs (Lisinopril) .Marland Kitchen.. 1 By Mouth Daily 6)  Lipitor 40 Mg Tabs (Atorvastatin Calcium) .... Take 1 Tablet By Mouth Once A Day 7)  Symbicort 160-4.5 Mcg/act  Aero (Budesonide-Formoterol Fumarate) .... 2 Puffs Twice Daily 8)  Proventil Hfa 108 (90 Base) Mcg/act Aers (Albuterol Sulfate) .... Inhale 2 Puffs Every 4 Hours As Needed 9)  Lasix 20 Mg  Tabs (Furosemide) .... Take 1 Tablet By Mouth Once A Day 10)  Klor-Con M20 20 Meq  Cr-Tabs (Potassium Chloride Crys Cr) .... Take 1 Tablet By Mouth Once A Day 11)  Zyrtec Allergy 10 Mg  Tabs (Cetirizine Hcl) .... Take 1 Tablet By Mouth Once A Day 12)  Delsym 30 Mg/57ml Lqcr (Dextromethorphan Polistirex) .... Take 2 Times Per Day As Needed 13)  Freestyle Lite Test  Strp (Glucose Blood) .... Use 1-2 Daily To Monitor Diabetes 14)  Freestyle Lite  Devi (Blood Glucose Monitoring Suppl) .... Use Daily To Monitor Diabetes 15)  Nyquil .... Take At Bedtime 16)  Adult Gummy Vitamins .... Once Daily 17)  Vitamin D 1000 Unit Tabs (Cholecalciferol) .... Once Daily  Allergies (verified): 1)  ! Penicillin 2)  ! Biaxin 3)   Amoxicillin (Amoxicillin) 4)  * Penicillins Group  Review of Systems      See HPI  Vital Signs:  Patient profile:   77 year old male Height:      69 inches Weight:      189.25 pounds BMI:     28.05 O2 Sat:      94 % on 2 L/minpulsed Temp:     97.9 degrees F oral Pulse rate:   69 / minute BP sitting:   122 / 60  (right arm) Cuff size:   regular  Vitals Entered By: Gweneth Dimitri RN (May 22, 2009 11:45 AM)  O2 Flow:  2 L/minpulsed CC: 4 mo follow up.  states breathing is doing well overall,  requesting info from [facility] to be completed for pt's o2.  Comments Medications reviewed with patient Daytime contact number verified with patient. Gweneth Dimitri RN  May 22, 2009 11:45 AM   Ambulatory Pulse Oximetry  Resting; HR_66____    02 Sat_91%ra____  Lap1 (185 feet)   HR_86____   02 Sat_85%ra____ Lap2 (185 feet)   HR_____   02 Sat_____    Lap3 (185 feet)   HR_____   02 Sat_____  ___Test Completed without Difficulty _x__Test Stopped due to:    low o2 sats, pt placed on 2L pulsed after 1st lap.  O2 sat increased to 94% 2L pulsed. Gweneth Dimitri RN  May 22, 2009 12:14 PM    Physical Exam  General:  wd male in nad Lungs:  mild decrease in bs, no wheezing or rhonchi currently Heart:  rrr Extremities:  no significant edema, no cyanosis Neurologic:  alert and oriented, moves all 4.   Impression & Recommendations:  Problem # 1:  EMPHYSEMA (ICD-492.8) the pt is doing fairly well overall.  He has no bronchospasm on exam, and is maintaining his usual baseline wrt exertional tolerance.  He was re-qualified for oxygen today, and I have asked him to continue on his current medication regimen.  Medications Added to Medication List This Visit: 1)  Delsym 30 Mg/62ml Lqcr (Dextromethorphan polistirex) .... Take 2 times per day as needed 2)  Adult Gummy Vitamins  .... Once daily 3)  Vitamin D 1000 Unit Tabs (Cholecalciferol) .... Once daily  Other Orders: Est. Patient Level  II (98119) Pulse Oximetry, Ambulatory (14782)  Patient Instructions: 1)  no change in meds 2)  stay as active as you can 3)  followup with me in 4mos

## 2010-05-16 NOTE — Progress Notes (Signed)
Summary: cough  Phone Note Call from Patient Call back at [phone]   Caller: Other Relative-[name] Powley Call For: Raydon Chappuis Reason for Call: Talk to Nurse Complaint: Cough/Sore throat Summary of Call: cough, last couple of days, last several minutes, neb seems to have helped.  Waiting on surgical repair for hyrnea.  Really needs to stop cough. Initial call taken by: Eugene Gavia,  Sep 04, 2009 11:27 AM  Follow-up for Phone Call        called and spoke with pt's family member, [name].  [name] states pt  was just seen by [facility] 08-29-2009 for surgical clearance for hernia repair.  [name] states pt has been c/o "tickle in throat" and coughing up clear sputum multiple times per day.  Symptoms started 2 days ago.  [name] states pt is doing neb tx as directed and will also rinse mouth out after using tx just to see if that helps with tickle in throat and cough but it doesn't.  [name] states pt is taking Zyrtec every day and is also taking Delsym as needed for cough which she states helps "a little bit."  [name] concerned because pt will be having hernia repair soon and worries about this coughing causing problems post-op.  Will forward message to [facility] to address. Aundra Millet Reynolds LPN  Sep 04, 2009 11:39 AM   Additional Follow-up for Phone Call Additional follow up Details #1::        let her know he is on lisinopril which can cause cough at any time in patients.  I think the first step is to stop this medication...may take up to 8 weeks to get out of his system.  Need to call primary to find out if he needs a substitute.  They can access this note as well. Additional Follow-up by: Barbaraann Share MD,  Sep 05, 2009 8:01 AM    Additional Follow-up for Phone Call Additional follow up Details #2::    advised [name] of [facility] recs, she states she will call pt PMD. She also states that if pt is not feeling better in a day or so she will call back for an appt, she did not want o set one today.  Carron Curie CMA   Sep 05, 2009 8:54 AM

## 2010-05-16 NOTE — Progress Notes (Signed)
Summary: cough, wheezing, SOB  Phone Note Other Incoming   Caller: [name] - daughter in law  [phone] Summary of Call: [name] states pt is having increased SOB, wheezing, cough, and fatique.  Sxs getting worse over the last 7-10 days.  States cough is prod "several times during the day."  Mucus is mainly clear to white with occas yellow.  States pt still has a good appetite and no fever.  using Mucinex DM and hydromet with some relief.  Also doing neb treatments.  Requesting KC's recs re: relief on the cough and wheezing.  OV offered but would rather see if something can be done via phone first.  Dr. Shelle Iron, pls advise.  Thanks! Initial call taken by: Gweneth Dimitri RN,  March 18, 2010 3:48 PM  Follow-up for Phone Call        his history does not sound like he is infected would call in prednisone 10mg ,  4 each day for 2 days, then 3 each day for 2 days,then 2 each day for 2 days, then stop.  #qs, no fills. Follow-up by: Barbaraann Share MD,  March 18, 2010 5:18 PM  Additional Follow-up for Phone Call Additional follow up Details #1::        Spoke with Daugter in law and made aware we are sending in a pred taper. She verbalized understanding. Additional Follow-up by: Vernie Murders,  March 18, 2010 5:20 PM    New/Updated Medications: PREDNISONE 10 MG TABS (PREDNISONE) 4 x 2 days, 3 x 2 days, 2 x 2 days, then stop Prescriptions: PREDNISONE 10 MG TABS (PREDNISONE) 4 x 2 days, 3 x 2 days, 2 x 2 days, then stop  #18 x 0   Entered by:   Vernie Murders   Authorized by:   Barbaraann Share MD   Signed by:   Vernie Murders on 03/18/2010   Method used:   [facility]        [id number]       [address]       [place], [place]  [address]       Ph: [phone]       Fax: [phone]   RxID:   [id number]

## 2010-05-16 NOTE — Assessment & Plan Note (Signed)
Summary: FLU SHOT/DUNCAN/DLO   Nurse Visit   Allergies: 1)  ! Penicillin 2)  ! Biaxin 3)  Amoxicillin (Amoxicillin) 4)  * Penicillins Group  Immunizations Administered:  Influenza Vaccine # 1:    Vaccine Type: Fluvax 3+    Site: left deltoid    Mfr: GlaxoSmithKline    Dose: 0.5 ml    Route: IM    Given by: Mervin Hack CMA (AAMA)    Exp. Date: 10/12/2010    Lot #: [id number]    VIS given: 11/06/09 version given January 02, 2010.  Flu Vaccine Consent Questions:    Do you have a history of severe allergic reactions to this vaccine? no    Any prior history of allergic reactions to egg and/or gelatin? no    Do you have a sensitivity to the preservative Thimersol? no    Do you have a past history of Guillan-Barre Syndrome? no    Do you currently have an acute febrile illness? no    Have you ever had a severe reaction to latex? no    Vaccine information given and explained to patient? yes  Orders Added: 1)  Flu Vaccine 85yrs + [90658] 2)  Admin 1st Vaccine [78469]

## 2010-05-16 NOTE — Progress Notes (Signed)
Summary: needs diabetic supplies  Phone Note Call from Patient   Caller: Daughter- [name] Call For: Shaune Leeks MD Summary of Call: Pt wants to start getting his diabetic supplies from [facility].  We need to fax an order for Freestyle Lite test strips and lancets.  Fax to [phone]. Initial call taken by: Lowella Petties CMA,  July 10, 2009 11:26 AM  Follow-up for Phone Call        Order faxed. Follow-up by: Lowella Petties CMA,  July 10, 2009 3:57 PM    Prescriptions: FREESTYLE LITE TEST  STRP (GLUCOSE BLOOD) use once daily to monitor diabetes  #100 x PRN   Entered and Authorized by:   Shaune Leeks MD   Signed by:   Shaune Leeks MD on 07/10/2009   Method used:   Print then Give to Patient   RxID:   [id number]

## 2010-05-16 NOTE — Progress Notes (Signed)
Summary: KC pt--runny nose, lethargic  Phone Note Call from Patient Call back at [phone]   Caller: [name] (daughter-in-law) Call For: [name] Reason for Call: Talk to Nurse, Talk to Doctor Summary of Call: Nasal drainage, coughing, PND, lethargic, SOB with exertion, runny nose "feels like he is getting a cold" x 2 days. Denies fever, n/v/d, bodyaches, chills, or sweats. Please advise. Thanks. Will forward to CY for review, KC is out of the office.  [facility] Initial call taken by: Zackery Barefoot CMA,  April 09, 2010 4:54 PM  Follow-up for Phone Call        PER CDY CALL IN [facility]PA[facility]  Carver Fila  April 09, 2010 5:10 PM   rx sent. [name] aware.Carron Curie CMA  April 09, 2010 5:27 PM     New/Updated Medications: ZITHROMAX Z-PAK 250 MG TABS (AZITHROMYCIN) as directed Prescriptions: ZITHROMAX Z-PAK 250 MG TABS (AZITHROMYCIN) as directed  #1 pak x 0   Entered by:   Carron Curie CMA   Authorized by:   Waymon Budge MD   Signed by:   Carron Curie CMA on 04/09/2010   Method used:   Electronically to        [id number]       [address]       [place], [place]  [address]       Ph: [phone]       Fax: [phone]   RxID:   [id number]

## 2010-05-16 NOTE — Letter (Signed)
Summary: [facility]   Imported By: Lester Norwood [place] 10/25/2009 12:17:24  _____________________________________________________________________  External Attachment:    Type:   Image     Comment:   External Document  Appended Document: [facility]     Clinical Lists Changes

## 2010-05-16 NOTE — Progress Notes (Signed)
Summary: Request Lipitor samples and rx  Phone Note Refill Request Call back at [phone] Message from:  [name] on May 02, 2009 9:26 AM  Refills Requested: Medication #1:  LIPITOR 40 MG TABS Take 1 tablet by mouth once a day [name] request samples of Lipitor 40mg  as well as a prescription for Lipitor 40mg . [facility]. [name] would like a call back at [phone] to pick up samples. Pt is out of medication. Please advise.    Method Requested: Pick up at Office Initial call taken by: Lewanda Rife LPN,  May 02, 2009 9:28 AM Caller: [name] Call For: Shaune Leeks MD  Follow-up for Phone Call        Please provide samps if we have them. Script can be called in if needed. Follow-up by: Shaune Leeks MD,  May 02, 2009 10:58 AM  Additional Follow-up for Phone Call Additional follow up Details #1::        Patient's daughter notified that a few samples will be left up front for pickup and rx will be called to pharmacy. [facility] Additional Follow-up by: Sydell Axon LPN,  May 02, 2009 2:40 PM    New/Updated Medications: LIPITOR 40 MG TABS (ATORVASTATIN CALCIUM) Take 1 tablet by mouth once a day Prescriptions: LIPITOR 40 MG TABS (ATORVASTATIN CALCIUM) Take 1 tablet by mouth once a day  #30 x 12   Entered and Authorized by:   Shaune Leeks MD   Signed by:   Sydell Axon LPN on 41/32/4401   Method used:   Telephoned to ...       [facility]  [address] [id number]       [address]       [place], [place]  [address]       Ph: [phone]       Fax: [phone]   RxID:   [id number]

## 2010-05-16 NOTE — Progress Notes (Signed)
Summary: Edema   Phone Note Call from Patient Call back at [phone]   Caller: Daughter Call For: [facility] Summary of Call: Patien'ts daughter [name] called to let RN and Dr. Mariah Milling know that [name]'s fingers and ankles are very swollen, more than usual. Would like call back at [phone] Initial call taken by: [name],  February 27, 2010 1:27 PM  Follow-up for Phone Call        Called spoke with pt's daughter went to pulmonary rehab today.  Nurse advised pt's daughter incr edema in fingers and hands and ankles are slightly swollen as well.  Daughter states not pitting, skin is not tight or shiney.  Please advise. Thanks Follow-up by: Cloyde Reams RN,  February 27, 2010 2:16 PM  Additional Follow-up for Phone Call Additional follow up Details #1::        Could try to decrease fluid intake for a few days, lasix two times a day over the weekend. Could also be fluid retention from prednisone. Watch salt intake.  Call back on monday if not getting better     Appended Document: Edema Called spoke with pt's daughter, advised of Dr Windell Hummingbird recommendations, will try to restrict salt and excess fluid intake, and incr Lasix to two times a day over the weekend.  Will call back Monday if edema persists.  EWJ

## 2010-05-16 NOTE — Progress Notes (Signed)
Summary: sob > appt 10.28.11  Phone Note Call from Patient Call back at [phone]   Caller: Daughter daughter in law bonnie Call For: clance Summary of Call: pt having more shortness of breath.she is using nebilzer. Initial call taken by: Rickard Patience,  February 07, 2010 2:26 PM  Follow-up for Phone Call        called spoke with bonnie who states that pt's having increased SOB, increased fatigue/weakness, occ wheezing.  she states that these symtpoms are sporadic with "good" days in-between but these days involve more coughing with clear mucus.  daughter requests ov before pt gets "too" bad.  appt scheduled w/ KC 10.28.11 @ 0900.  bonnie okay with this date and time. Boone Master CNA/MA  February 07, 2010 2:35 PM

## 2010-05-16 NOTE — Assessment & Plan Note (Signed)
Summary: rov    Primary Provider:  Hetty [name]  CC:  Usual SOB.  History of Present Illness: [name] is a delightful 77 year old male with a history of coronary artery disease status post stenting of the third marginal with a nondrug-eluting stent in September 2005.  This was complicated by stent thrombosis after discontinuation of his Plavix resulting in acute inferior and posterior myocardial infarction followed by repeat stenting with DES.   The remainder of his medical history is notable for diabetes, hypertension, hyperlipidemia, O2 dependent COPD, bradycardia requiring discontinuation of his beta-blocker, bilateral hip replacement, and obstructive sleep apnea with noncompliance to CPAP.   He returns today with his daughter for routine followup.  He is doing well. He is much more compliant with his oxygen and feels he can do anything he wants to do although "that ain't much." Denies CP, orthopnea or PND.   No lower extremity edema or palpitations. Still taking Plaivx..  He does continue to have some problems with his memory but this seems stable.    Allergies: 1)  ! Penicillin 2)  ! Biaxin 3)  Amoxicillin (Amoxicillin) 4)  * Penicillins Group  Past History:  Past Medical History: Last updated: 01/31/2009 BRADYCARDIA (ICD-427.89) CT, CHEST, ABNORMAL (ICD-793.1) DEMENTIA (ICD-294.8) MASS, LUNG R HILUM (ICD-786.6) SHORTNESS OF BREATH (ICD-786.05) WEAKNESS (ICD-780.79) CHRONIC RESPIRATORY FAILURE (ICD-518.83) PURE HYPERCHOLESTEROLEMIA (ICD-272.0) OBSTRUCTIVE SLEEP APNEA (ICD-327.23) ALLERGIC RHINITIS, CHRONIC (ICD-477.9) EMPHYSEMA (ICD-492.8) INGUINAL HERNIA, RIGHT (ICD-550.90) DISORDER, SKIN NEC R SHIN (ICD-709.8) HEMORRHOIDS (ICD-455.6) VARICOSE VEIN, MILD (ICD-456.8) HYPERTENSION (ICD-401.9) DIABETES MELLITUS, TYPE II (ICD-250.00) CORONARY ARTERY DISEASE (ICD-414.00) COPD (ICD-496)  Past Surgical History: Last updated: 11/03/2008 Hernia repair, Left recurrent  01/00 [facility],  Cholecystitis, cholecyst, ERCP 01/26/03-01/31/2003 CATH with PICA 04/09/04 Stress/Adenosine Myoview, abnormal EF 50% 03/25/04 Left THR, anemia 2 units PRBC 02/06 [facility] R/O + MI, succ stenosis 02/20-02/23/06 CATH, acute info post MI, stent PICA 06/03/2004 Rehab in pt. 02/16-02/23/07--out pt rehab Adenosine Myoview EF 46% 08/13/05 RHR 05/31/05 ETT Myoview Adenosine - no ischemia  09/02/06 HOSP R/O'D [facility] Pulm Mass 7/17-7/19/2010 CT Angio Chest Neg PE  3x3x2.2cm R Hilar Mass 10/28/2008 PET Scan Inflam/Infect Atelect RUL w/ Reactive Lymphadenopathy 11/02/2008  Family History: Last updated: 22-May-2008 Father: Died at age 59 Mother: Died at age 31 Brother  A 31 DM HTn Chol Vision DIffs Brother dec  65Parkinson's  six sisters, 5  living  one sister with breast cancer Sister dec 65  "Cancer" HBP - self DM - self Breast cancer - sister  Social History: Last updated: 01/31/2009 Marital Status: Married Children: 2 Occupation: Retired as a Administrator, 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   Risk Factors: Caffeine Use: 4 (2008/05/22) Exercise: no (2008/05/22)  Risk Factors: Smoking Status: quit (01/31/2009)   Review of Systems       As per HPI and past medical history; otherwise all systems negative.   Vital Signs:  Patient profile:   77 year old male Height:      66 inches Weight:      184.50 pounds BMI:     29.89 Pulse rate:   58 / minute Pulse rhythm:   regular BP sitting:   120 / 60  (left arm) Cuff size:   regular  Vitals Entered By: Mercer Pod (May 09, 2009 2:00 PM)  Physical Exam  General:  Gen: elderly well appearing. no resp difficulty. hard of hearing  wearing O2 HEENT: normal Neck: supple. no JVD. Carotids 2+ bilat; no bruits. No lymphadenopathy  or thryomegaly appreciated. Cor: PMI nondisplaced.  Distant heart sounds. Regualr and bradycardic. No rubs, gallops, murmur. Lungs: clear with decreased air  movement Abdomen: soft, nontender, nondistended.Peri Jefferson bowel sounds. Extremities: no cyanosis, rash, edema. + brittle fingernails and clubbing  Neuro: alert & orientedx3, cranial nerves grossly intact. moves all 4 extremities w/o difficulty. affect pleasant    Impression & Recommendations:  Problem # 1:  CORONARY ARTERY DISEASE (ICD-414.00) Stable. No evidence of ischemia. Continue current regimen. Given h/o stent thrombosis will continue Plavix indefinitely.  Problem # 2:  HYPERTENSION (ICD-401.9) Blood pressure well controlled. Continue current regimen. We did discsuss possibly stopping lisinopril as benefit at this dose likely small but he has decided to continue at this point.  Problem # 3:  PURE HYPERCHOLESTEROLEMIA (ICD-272.0) Goal LDL < 70. Folloed by PCP and scheduled for check soon. Continue statin.  Patient Instructions: 1)  Your physician recommends that you schedule a follow-up appointment in: 1 year 2)  Your physician recommends that you continue on your current medications as directed. Please refer to the Current Medication list given to you today.

## 2010-05-16 NOTE — Miscellaneous (Signed)
Summary: Order/[facility] ([facility])  Order/[facility] ([facility])   Imported By: Sherian Rein 01/16/2010 12:36:31  _____________________________________________________________________  External Attachment:    Type:   Image     Comment:   External Document

## 2010-05-16 NOTE — Progress Notes (Signed)
Summary: re-faxing now for kc's signature/ pulm rehab-awaiting fax  Phone Note From Other Clinic   Caller: stephen at [facility] - [facility] rehab Call For: Jakeya Gherardi Summary of Call: caller says he received fax back from dr Adonis Ryther but is now re-faxing this as they need his "actual signature on this. then requests to have this faxed back asap. caller's ph# [phone] Initial call taken by: Tivis Ringer, CNA,  April 23, 2010 10:24 AM  Follow-up for Phone Call        Will await fax Vernie Murders  April 23, 2010 10:30 AM  Aundra Millet, can you tell if this was ever recieved? Pls advise thanks Vernie Murders  April 25, 2010 3:58 PM   Additional Follow-up for Phone Call Additional follow up Details #1::        received fax and put in [facility] very important look at folder for him to sign. Arman Filter LPN  April 29, 2010 8:48 AM     Additional Follow-up for Phone Call Additional follow up Details #2::    done Follow-up by: Barbaraann Share MD,  April 30, 2010 9:34 AM

## 2010-05-16 NOTE — Medication Information (Signed)
Summary: [employer] [facility]   Imported By: Valinda Hoar 08/31/2009 15:18:16  _____________________________________________________________________  External Attachment:    Type:   Image     Comment:   External Document

## 2010-05-16 NOTE — Assessment & Plan Note (Signed)
Summary: ONE MONTH FOLLOW UP FOR BLOOD PRESSURE   Vital Signs:  Patient profile:   77 year old male Weight:      177.75 pounds Temp:     98.1 degrees F oral Pulse rate:   60 / minute Pulse rhythm:   regular BP sitting:   110 / 68  (left arm) Cuff size:   regular  Vitals Entered By: Sydell Axon LPN (October 11, 2009 12:00 PM) CC: One month follow-up on BP and sugar   History of Present Illness: Pt here with daughter for followup of BP. he was put on Lisinopril and then due to chronic cough, recently switched to Cozaar which he has tolerated without difficulty. He has been seen by pulm in the interim a few times for SOB and dyspnea and has recently finsihed a Prednisone taper and Abs. He feels better, is on chronic O2 "which I don't need" but helps him walk farther and with less dyspnea in general, and has no complaints today. The daughter would like him tested for dementia today. He does crossword puzzles a lot but otherwise has trouble remembering.  Problems Prior to Update: 1)  Chronic Obstructive Pulmonary Disease, Acute Exacerbation  (ICD-491.21) 2)  Preoperative Examination  (ICD-V72.84) 3)  Hyperlipidemia-mixed  (ICD-272.4) 4)  Emphysema  (ICD-492.8) 5)  Bradycardia  (ICD-427.89) 6)  Ct, Chest, Abnormal  (ICD-793.1) 7)  Dementia  (ICD-294.8) 8)  Mass, Lung R Hilum  (ICD-786.6) 9)  Shortness of Breath  (ICD-786.05) 10)  Weakness  (ICD-780.79) 11)  Chronic Respiratory Failure  (ICD-518.83) 12)  Pure Hypercholesterolemia  (ICD-272.0) 13)  Obstructive Sleep Apnea  (ICD-327.23) 14)  Allergic Rhinitis, Chronic  (ICD-477.9) 15)  Special Screening Malig Neoplasms Other Sites  (ICD-V76.49) 16)  Inguinal Hernia, Right  (ICD-550.90) 17)  Disorder, Skin Nec R Shin  (ICD-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 Take 2 Tablet By Mouth Once A Day 2)  Glimepiride 4 Mg Tabs (Glimepiride) .Marland Kitchen.. 1 By Mouth Once Daily 3)  Plavix 75 Mg Tabs (Clopidogrel Bisulfate) .Marland Kitchen.. 1 Daily By Mouth 4)  Oxygen 2 L .... 24/7 5)  Cozaar 50 Mg Tabs (Losartan Potassium) .... One Tab By Mouth Daily 6)  Proventil Hfa 108 (90 Base) Mcg/act Aers (Albuterol Sulfate) .... Inhale 2 Puffs Every 4 Hours As Needed 7)  Lasix 20 Mg  Tabs (Furosemide) .... Take 1 Tablet By Mouth Once A Day 8)  Klor-Con M20 20 Meq  Cr-Tabs (Potassium Chloride Crys Cr) .... Take 1 Tablet By Mouth Once A Day 9)  Zyrtec Allergy 10 Mg  Tabs (Cetirizine Hcl) .... Take 1 Tablet By Mouth Once A Day 10)  Delsym 30 Mg/54ml Lqcr (Dextromethorphan Polistirex) .... Take 2 Times Per Day As Needed 11)  Freestyle Lite Test  Strp (Glucose Blood) .... Use Once Daily To Monitor Diabetes 12)  Freestyle Lite  Devi (Blood Glucose Monitoring Suppl) .... Use Daily To Monitor Diabetes 13)  Nyquil .... Take At Bedtime 14)  Adult Gummy Vitamins .... Once Daily 15)  Vitamin D 1000 Unit Tabs (Cholecalciferol) .... Once Daily 16)  Duoneb 0.5-2.5 (3) Mg/30ml Soln (Ipratropium-Albuterol) .... One Treatment At Breakfast, Lunch, Dinner, Bedtime Everyday....can Take 2 Additional Treatments A Day If A Bad Day 17)  Budesonide 0.5 Mg/8ml Susp (Budesonide) .Marland KitchenMarland KitchenMarland Kitchen  One Treatment Twice Daily 18)  Simvastatin 80 Mg Tabs (Simvastatin) .... Take 1 Tablet By Mouth Once A Day 19)  Hydromet 5-1.5 Mg/41ml Syrp (Hydrocodone-Homatropine) .... 1/2 Tsp Three Times A Day As Needed Cough, May Make You Sleepy /off Balance 20)  Levaquin 750 Mg  Tabs (Levofloxacin) .... One Tablet By Mouth Daily 21)  Prednisone 10 Mg  Tabs (Prednisone) .... Take 4 Each Day For 2 Days, Then 3 Each Day For 2 Days, Then 2 Each Day For 2 Days, Then 1 Each Day For 2 Days, Then Stop  Allergies: 1)  ! Penicillin 2)  ! Biaxin 3)  Amoxicillin (Amoxicillin) 4)  * Penicillins  Group  Physical Exam  General:  Elderly appearing male  in NAD non toxic appearing and on chronic O2 via [place], pleasant and cheerful. Head:  Normocephalic and atraumatic without obvious abnormalities. No apparent alopecia but some thinning/ balding. Sinuses NT. Eyes:  Conjunctiva clear bilaterally.  Ears:  External ear exam shows no significant lesions or deformities.  Otoscopic examination reveals clear canals, tympanic membranes are intact bilaterally without bulging, retraction, inflammation or discharge. Hearing is grossly decreased  bilaterally. Nose:  External nasal examination shows no deformity or inflammation. Nasal mucosa are pink and moist without lesions or exudates. [place] in place. Mouth:  Oral mucosa and oropharynx without lesions or exudates.  Teeth in good repair. Neck:  no carotid bruit or thyromegaly no cervical or supraclavicular lymphadenopathy  Chest Wall:  No deformities, masses, tenderness or gynecomastia noted. Lungs:  Decreased BS throughout, but no wheezing ,no rhonchi. no rales Heart:  normal rate, regular rhythm, no murmur, no gallop, and no JVD.   Neurologic:  No cranial nerve deficits noted. Station and gait are normal for age and wide-based. Sensory, motor and coordinative functions appear intact. MMSE and MMX administered. Psych:  Cognition and judgment appear intact. Alert and cooperative with normal attention span and concentration. No apparent delusions, illusions, hallucinations.   Impression & Recommendations:  Problem # 1:  HYPERTENSION (ICD-401.9) Assessment Unchanged Stable/improved on Cozaar. Cont. His updated medication list for this problem includes:    Cozaar 50 Mg Tabs (Losartan potassium) ..... One tab by mouth daily    Lasix 20 Mg Tabs (Furosemide) .Marland Kitchen... Take 1 tablet by mouth once a day  BP today: 110/68 Prior BP: 118/60 (09/25/2009)  Labs Reviewed: K+: 4.2 (10/03/2009) Creat: : 1.1 (10/03/2009)   Chol: 126 (05/21/2009)   HDL: 54.40  (05/21/2009)   LDL: 56 (05/21/2009)   TG: 78.0 (05/21/2009)  Problem # 2:  CHRONIC OBSTRUCTIVE PULMONARY DISEASE, ACUTE EXACERBATION (ICD-491.21) Assessment: Improved Doing better after recent trmt. Will follow.  Problem # 3:  DEMENTIA (ICD-294.8) Assessment: Unchanged MMSE w/ serial sevens 20/30 w/ [place]  23/30       MMX 27/50 Discussed with daughter. Will strart Aricept today , incr in one month and start Namenda in Oct.  Complete Medication List: 1)  Aspirin Ec 81 Mg Tbec (Aspirin) .... Take 2 tablet by mouth once a day 2)  Glimepiride 4 Mg Tabs (Glimepiride) .Marland Kitchen.. 1 by mouth once daily 3)  Plavix 75 Mg Tabs (Clopidogrel bisulfate) .Marland Kitchen.. 1 daily by mouth 4)  Oxygen 2 L  .... 24/7 5)  Cozaar 50 Mg Tabs (Losartan potassium) .... One tab by mouth daily 6)  Proventil Hfa 108 (90 Base) Mcg/act Aers (Albuterol sulfate) .... Inhale 2 puffs every 4 hours as needed 7)  Lasix 20 Mg Tabs (Furosemide) .... Take 1 tablet by mouth once a day 8)  Klor-con M20 20 Meq Cr-tabs (Potassium chloride crys cr) .... Take 1 tablet by mouth once a day 9)  Zyrtec Allergy 10 Mg Tabs (Cetirizine hcl) .... Take 1 tablet by mouth once a day 10)  Delsym 30 Mg/65ml Lqcr (Dextromethorphan polistirex) .... Take 2 times per day as needed 11)  Freestyle Lite Test Strp (Glucose blood) .... Use once daily to monitor diabetes 12)  Freestyle Lite Devi (Blood glucose monitoring suppl) .... Use daily to monitor diabetes 13)  Nyquil  .... Take at bedtime 14)  Adult Gummy Vitamins  .... Once daily 15)  Vitamin D 1000 Unit Tabs (Cholecalciferol) .... Once daily 16)  Duoneb 0.5-2.5 (3) Mg/51ml Soln (Ipratropium-albuterol) .... One treatment at breakfast, lunch, dinner, bedtime everyday....can take 2 additional treatments a day if a bad day 17)  Budesonide 0.5 Mg/90ml Susp (Budesonide) .... One treatment twice daily 18)  Simvastatin 80 Mg Tabs (Simvastatin) .... Take 1/2  tablet by mouth once a day 19)  Hydromet 5-1.5 Mg/38ml Syrp  (Hydrocodone-homatropine) .... 1/2 tsp three times a day as needed cough, may make you sleepy /off balance 20)  Levaquin 750 Mg Tabs (Levofloxacin) .... One tablet by mouth daily 21)  Prednisone 10 Mg Tabs (Prednisone) .... Take 4 each day for 2 days, then 3 each day for 2 days, then 2 each day for 2 days, then 1 each day for 2 days, then stop 22)  Aricept 5 Mg Tabs (Donepezil hcl) .... One tab by mouth once daily 23)  Aricept 10 Mg Tabs (Donepezil hcl) .... One tab by mouth once daily  Patient Instructions: 1)  Call in one week for appt in Oct, sooner as needed. Prescriptions: ARICEPT 10 MG TABS (DONEPEZIL HCL) one tab by mouth once daily  #30 x 12   Entered and Authorized by:   Shaune Leeks MD   Signed by:   Shaune Leeks MD on 10/11/2009   Method used:   Electronically to        [id number] (ret[facility]       [address]       [place], [place]  [address]       Ph: [phone]       Fax: [phone]   RxID:   [id number] ARICEPT 5 MG TABS (DONEPEZIL HCL) one tab by mouth once daily  #30 x 12   Entered and Authorized by:   Shaune Leeks MD   Signed by:   Shaune Leeks MD on 10/11/2009   Method used:   Electronically to        [id number] (retail)       [address]       [place], [place]  [address]       Ph: [phone]       Fax: [phone]   RxID:   [id number]

## 2010-05-16 NOTE — Consult Note (Signed)
Summary: Dr.Michael [facility],Note  Dr.Michael Brennan,[facility],Note   Imported By: Beau Fanny 08/02/2009 16:18:09  _____________________________________________________________________  External Attachment:    Type:   Image     Comment:   External Document  Appended Document: Dr.Michael [facility],Note     Clinical Lists Changes  Observations: Added new observation of DMEYEEXAMNXT: 08/2010 (08/02/2009 18:18) Added new observation of DMEYEEXMRES: normal (07/30/2009 18:19) Added new observation of EYE EXAM BY: Dr Fransico Michael (07/30/2009 18:19) Added new observation of DIAB EYE EX: normal (07/30/2009 18:19)        Diabetes Management Exam:    Eye Exam:       Eye Exam done elsewhere          Date: 07/30/2009          Results: normal          Done by: Dr Fransico Michael

## 2010-05-16 NOTE — Progress Notes (Signed)
Summary: requests namenda  Phone Note Refill Request Call back at [facility] Phone [phone] Message from:  [name]- daughter in law  Refills Requested: Medication #1:  Namenda 10 mg Pt has been taking samples of this that he was given a couple of months ago.  It's not on med list.  Please send to [facility].  Initial call taken by: Lowella Petties CMA, AAMA,  March 27, 2010 12:10 PM    New/Updated Medications: NAMENDA 10 MG TABS (MEMANTINE HCL) one tab by mouth two times a day Prescriptions: NAMENDA 10 MG TABS (MEMANTINE HCL) one tab by mouth two times a day  #60 x 12   Entered and Authorized by:   Shaune Leeks MD   Signed by:   Shaune Leeks MD on 03/27/2010   Method used:   Electronically to        [id number] ([facility]       [address]       [place], [place]  [address]       Ph: [phone]       Fax: [phone]   RxID:   [id number]

## 2010-05-16 NOTE — Letter (Signed)
Summary: Nadara Eaton letter  [place] [facility]  [address] [place], [place] [address]   Phone: [phone]  Fax: [phone]       11/19/2009 MRN: [id number]  SAMWISE ECKARDT [address] [place], [place]  [address]  Dear Mr. ZYAIR, [facility] [place], and [facility] announce the retirement of Arta Silence, M.D., from full-time practice at the [facility] office effective October 11, 2009 and his plans of returning part-time.  It is important to Dr. Hetty Ely and to our practice that you understand that [facility] has seven physicians in our office for your health care needs.  We will continue to offer the same exceptional care that you have today.    Dr. Hetty Ely has spoken to many of you about his plans for retirement and returning part-time in the fall.   We will continue to work with you through the transition to schedule appointments for you in the office and meet the high standards that [facility] is committed to.   Again, it is with great pleasure that we share the news that Dr. Hetty Ely will return to [facility] in October of 2011 with a reduced schedule.    If you have any questions, or would like to request an appointment with one of our physicians, please call us at [phone] and press the option for Scheduling an appointment.  We take pleasure in providing you with excellent patient care and look forward to seeing you at your next office visit.  Our [facility] Physicians are:  Tillman Abide, M.D. Laurita Quint, M.D. Roxy Manns, M.D. Kerby Nora, M.D. Hannah Beat, M.D. Ruthe Mannan, M.D. We proudly welcomed Raechel Ache, M.D. and Eustaquio Boyden, M.D. to the practice in July/August 2011.  Sincerely,   [facility]

## 2010-05-16 NOTE — Progress Notes (Signed)
Summary: fax req- asap- surgery clearance  Phone Note From Other Clinic   Caller: alicia w/ dr Doreen Salvage office Call For: clance Summary of Call: need notes from 5/12 ov re: surgery clearnace. fax to attnHelmut Muster- [phone]. contac alicia if needed at [phone] Initial call taken by: Tivis Ringer, CNA,  Aug 30, 2009 10:30 AM  Follow-up for Phone Call        Faxed notes.//Juanita Follow-up by: Darletta Moll,  Aug 30, 2009 11:15 AM

## 2010-05-16 NOTE — Progress Notes (Signed)
Summary: ? Labs  Phone Note Call from Patient   Caller: [name]   [phone] Call For: Shaune Leeks MD Summary of Call: Patient has labs scheduled on Wednesday and he will not finish his Prednisone until then.  Is it okay that he still get his labs done or should he reschedule?  He is supposed to be getting a BMET. Initial call taken by: Delilah Shan CMA Duncan Dull),  October 01, 2009 8:59 AM  Follow-up for Phone Call        [place] question but I don't think it should make any difference. Follow-up by: Shaune Leeks MD,  October 01, 2009 1:42 PM  Additional Follow-up for Phone Call Additional follow up Details #1::        Patient Advised.  Additional Follow-up by: Delilah Shan CMA Duncan Dull),  October 01, 2009 2:17 PM

## 2010-05-16 NOTE — Assessment & Plan Note (Signed)
Summary: F/U/CLE   Vital Signs:  Patient profile:   77 year old male Weight:      181 pounds O2 Sat:      95 % on 3 L/min Temp:     97.1 degrees F oral Pulse rate:   76 / minute Pulse rhythm:   regular Resp:     24 per minute BP sitting:   122 / 62  (left arm) Cuff size:   large  Vitals Entered By: Sydell Axon LPN (February 14, 2010 12:03 PM)  O2 Flow:  3 L/min CC: follow-up visit   History of Present Illness: Pt here with his daughter for followup. Saw Dr Shelle Iron last week for COPD exacerbation and is on prednisone, which his daughter says has helped a whole lot. He is on continuous flow O2 now and seems back to his usual self in demeanor but seems slightly challenged with breathing.  I put him on Aricept last visit with me and he has been on 10mg  for a few months now. It is difficult to tell if it is helping much but the daughter says he is now back to dwelling on positives rather than negatives. He has had no problems with the medication.  Problems Prior to Update: 1)  Allergic Rhinitis Cause Unspecified  (ICD-477.9) 2)  Hyperlipidemia-mixed  (ICD-272.4) 3)  Chronic Obstructive Pulmonary Disease, Acute Exacerbation  (ICD-491.21) 4)  Preoperative Examination  (ICD-V72.84) 5)  Hyperlipidemia-mixed  (ICD-272.4) 6)  Emphysema  (ICD-492.8) 7)  Bradycardia  (ICD-427.89) 8)  Ct, Chest, Abnormal  (ICD-793.1) 9)  Dementia  (ICD-294.8) 10)  Mass, Lung R Hilum  (ICD-786.6) 11)  Shortness of Breath  (ICD-786.05) 12)  Weakness  (ICD-780.79) 13)  Chronic Respiratory Failure  (ICD-518.83) 14)  Pure Hypercholesterolemia  (ICD-272.0) 15)  Obstructive Sleep Apnea  (ICD-327.23) 16)  Special Screening Malig Neoplasms Other Sites  (ICD-V76.49) 17)  Inguinal Hernia, Right  (ICD-550.90) 18)  Disorder, 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 Take 2 Tablet By Mouth Once A Day 2)  Glimepiride 4 Mg Tabs (Glimepiride) .Marland Kitchen.. 1 By Mouth Once Daily 3)  Plavix 75 Mg Tabs (Clopidogrel Bisulfate) .Marland Kitchen.. 1 Daily By Mouth 4)  Oxygen 3 L .... 24/7 5)  Cozaar 50 Mg Tabs (Losartan Potassium) .... One Tab By Mouth Daily 6)  Proventil Hfa 108 (90 Base) Mcg/act Aers (Albuterol Sulfate) .... Inhale 2 Puffs Every 4 Hours As Needed 7)  Lasix 20 Mg  Tabs (Furosemide) .... Take 1 Tablet By Mouth Once A Day 8)  Klor-Con M20 20 Meq  Cr-Tabs (Potassium Chloride Crys Cr) .... Take 1 Tablet By Mouth Once A Day 9)  Zyrtec Allergy 10 Mg  Tabs (Cetirizine Hcl) .... Take 1 Tablet By Mouth Once A Day 10)  Delsym 30 Mg/3ml Lqcr (Dextromethorphan Polistirex) .... Take 2 Times Per Day As Needed 11)  Freestyle Lite Test  Strp (Glucose Blood) .... Use Once Daily To Monitor Diabetes 12)  Freestyle Lite  Devi (Blood Glucose Monitoring Suppl) .... Use Daily To Monitor Diabetes 13)  Nyquil .... Take At Bedtime 14)  Adult Gummy Vitamins .... Once Daily 15)  Vitamin D 1000 Unit Tabs (Cholecalciferol) .... Once Daily 16)  Duoneb 0.5-2.5 (3) Mg/64ml Soln (Ipratropium-Albuterol) .Marland KitchenMarland KitchenMarland Kitchen  One Treatment At Breakfast, Lunch, Dinner, Bedtime Everyday....can Take 2 Additional Treatments A Day If A Bad Day 17)  Budesonide 0.5 Mg/53ml Susp (Budesonide) .... One Treatment Twice Daily 18)  Simvastatin 80 Mg Tabs (Simvastatin) .... Take 1/2  Tablet By Mouth Once A Day 19)  Hydromet 5-1.5 Mg/65ml Syrp (Hydrocodone-Homatropine) .... 1/2 Tsp Three Times A Day As Needed Cough, May Make You Sleepy /off Balance 20)  Aricept 10 Mg Tabs (Donepezil Hcl) .... One Tab By Mouth Once Daily 21)  Mucinex Dm Maximum Strength 60-1200 Mg Xr12h-Tab (Dextromethorphan-Guaifenesin) .Marland Kitchen.. 1 Two Times A Day As Needed 22)  Ipratropium Bromide 0.06 % Soln (Ipratropium Bromide) .... 2 Each Nostril Each Am 23)  Flutter Valve  .... Twice A Day 24)  Prednisone 10 Mg  Tabs (Prednisone) .... Take 4 Each Day For 2 Days, Then 3 Each Day For 2 Days, Then 2 Each Day For 2 Days, Then 1 Each Day For 2 Days, Then Stop  Allergies: 1)  ! Penicillin 2)  ! Biaxin 3)  Amoxicillin (Amoxicillin) 4)  * Penicillins Group  Physical Exam  General:  Elderly appearing male  in NAD non toxic appearing and on chronic O2 via [place], pleasant and cheerful. Head:  Normocephalic and atraumatic without obvious abnormalities. No apparent alopecia but some thinning/ balding. Sinuses NT. Eyes:  Conjunctiva clear bilaterally.  Ears:  External ear exam shows no significant lesions or deformities.  Otoscopic examination reveals clear canals, tympanic membranes are intact bilaterally without bulging, retraction, inflammation or discharge. Hearing is grossly decreased  bilaterally. Nose:  External nasal examination shows no deformity or inflammation. Nasal mucosa are pink and moist without lesions or exudates. Clear mucous abundant. [place] in place. Mouth:  Oral mucosa and oropharynx without lesions or exudates.  Teeth in good repair. Neck:  no carotid bruit or thyromegaly no cervical or supraclavicular lymphadenopathy  Chest Wall:  No deformities, masses, tenderness or gynecomastia noted. Lungs:  Decreased BS throughout, but no wheezing , slight rhonchi,  no rales Heart:  normal rate, regular rhythm, no murmur, no gallop, and no JVD.     Impression & Recommendations:  Problem # 1:  DEMENTIA (ICD-294.8) Assessment Unchanged Tolerating Aricept w/o diff. Difficult to tell if helping. May have asjusted attitude slightly.  Discussed effects of medication and hoped for response. Will add Namenda with a start kit today and allow the family to taper off everythind if felt ineffective.  Problem # 2:  HYPERTENSION (ICD-401.9) Assessment: Unchanged Stable. His updated medication list for this problem includes:    Cozaar 50 Mg Tabs (Losartan potassium) .....  One tab by mouth daily    Lasix 20 Mg Tabs (Furosemide) .Marland Kitchen... Take 1 tablet by mouth once a day  BP today: 122/62 Prior BP: 128/68 (02/08/2010)  Labs Reviewed: K+: 4.2 (10/03/2009) Creat: : 1.1 (10/03/2009)   Chol: 136 (12/14/2009)   HDL: 51 (12/14/2009)   LDL: 65 (12/14/2009)   TG: 98 (12/14/2009)  Problem # 3:  DIABETES MELLITUS, TYPE II (ICD-250.00) Assessment: Unchanged  Imagine his nos slightly elevated on steroids but is necessary and will not last much longer. Finish meds. His updated medication list for this problem includes:    Aspirin Ec 81 Mg Tbec (Aspirin) .Marland Kitchen... Take 2 tablet by mouth once a day    Glimepiride 4 Mg Tabs (Glimepiride) .Marland Kitchen... 1 by mouth once daily    Cozaar 50 Mg Tabs (Losartan potassium) ..... One tab by mouth daily  Labs Reviewed: Creat: 1.1 (10/03/2009)   Microalbumin: 5.6 (  04/29/2006)  Last Eye Exam: normal (07/30/2009) Reviewed HgBA1c results: 6.8 (05/21/2009)  6.4 (04/25/2008)  Complete Medication List: 1)  Aspirin Ec 81 Mg Tbec (Aspirin) .... Take 2 tablet by mouth once a day 2)  Glimepiride 4 Mg Tabs (Glimepiride) .Marland Kitchen.. 1 by mouth once daily 3)  Plavix 75 Mg Tabs (Clopidogrel bisulfate) .Marland Kitchen.. 1 daily by mouth 4)  Oxygen 3 L  .... 24/7 5)  Cozaar 50 Mg Tabs (Losartan potassium) .... One tab by mouth daily 6)  Proventil Hfa 108 (90 Base) Mcg/act Aers (Albuterol sulfate) .... Inhale 2 puffs every 4 hours as needed 7)  Lasix 20 Mg Tabs (Furosemide) .... Take 1 tablet by mouth once a day 8)  Klor-con M20 20 Meq Cr-tabs (Potassium chloride crys cr) .... Take 1 tablet by mouth once a day 9)  Zyrtec Allergy 10 Mg Tabs (Cetirizine hcl) .... Take 1 tablet by mouth once a day 10)  Delsym 30 Mg/29ml Lqcr (Dextromethorphan polistirex) .... Take 2 times per day as needed 11)  Freestyle Lite Test Strp (Glucose blood) .... Use once daily to monitor diabetes 12)  Freestyle Lite Devi (Blood glucose monitoring suppl) .... Use daily to monitor diabetes 13)  Nyquil   .... Take at bedtime 14)  Adult Gummy Vitamins  .... Once daily 15)  Vitamin D 1000 Unit Tabs (Cholecalciferol) .... Once daily 16)  Duoneb 0.5-2.5 (3) Mg/74ml Soln (Ipratropium-albuterol) .... One treatment at breakfast, lunch, dinner, bedtime everyday....can take 2 additional treatments a day if a bad day 17)  Budesonide 0.5 Mg/64ml Susp (Budesonide) .... One treatment twice daily 18)  Simvastatin 80 Mg Tabs (Simvastatin) .... Take 1/2  tablet by mouth once a day 19)  Hydromet 5-1.5 Mg/26ml Syrp (Hydrocodone-homatropine) .... 1/2 tsp three times a day as needed cough, may make you sleepy /off balance 20)  Aricept 10 Mg Tabs (Donepezil hcl) .... One tab by mouth once daily 21)  Mucinex Dm Maximum Strength 60-1200 Mg Xr12h-tab (Dextromethorphan-guaifenesin) .Marland Kitchen.. 1 two times a day as needed 22)  Ipratropium Bromide 0.06 % Soln (Ipratropium bromide) .... 2 each nostril each am 23)  Flutter Valve  .... Twice a day 24)  Prednisone 10 Mg Tabs (Prednisone) .... Take 4 each day for 2 days, then 3 each day for 2 days, then 2 each day for 2 days, then 1 each day for 2 days, then stop 25)  Melatonin 3-10 Mg Tabs (Melatonin-pyridoxine) .... Take one by mouth at bedtime  Patient Instructions: 1)  RTC as needed or 3-4 mos for recheck.   Orders Added: 1)  Est. Patient Level III [04540]    Current Allergies (reviewed today): ! PENICILLIN ! BIAXIN AMOXICILLIN (AMOXICILLIN) * PENICILLINS GROUP

## 2010-05-16 NOTE — Miscellaneous (Signed)
Summary: Order for Respiratory Eval/[facility]  Order for Respiratory Eval/[facility]   Imported By: Sherian Rein 05/10/2009 09:29:55  _____________________________________________________________________  External Attachment:    Type:   Image     Comment:   External Document

## 2010-05-16 NOTE — Letter (Signed)
Summary: External Correspondence  External Correspondence   Imported By: Valinda Hoar 07/16/2009 10:34:24  _____________________________________________________________________  External Attachment:    Type:   Image     Comment:   External Document

## 2010-05-16 NOTE — Letter (Signed)
Summary: Results Follow up Letter  [facility]  [address] [place], [place] [address]   Phone: [phone]  Fax: [phone]    06/18/2009 MRN: [id number]  [name] [address] [place], [place]  [address]  Dear Mr. NAM,  The following are the results of your recent test(s):  Test         Result    Pap Smear:        Normal _____  Not Normal _____ Comments: ______________________________________________________ Cholesterol: LDL(Bad cholesterol):         Your goal is less than:         HDL (Good cholesterol):       Your goal is more than: Comments:  ______________________________________________________ Mammogram:        Normal _____  Not Normal _____ Comments:  ___________________________________________________________________ Hemoccult:        Normal __X___  Not normal _______ Comments:  Please repeat in one year.  _____________________________________________________________________ Other Tests:    We routinely do not discuss normal results over the telephone.  If you desire a copy of the results, or you have any questions about this information we can discuss them at your next office visit.   Sincerely,     Laurita Quint, MD

## 2010-05-16 NOTE — Assessment & Plan Note (Signed)
Summary: acute sick visit for copd exacerbation   Copy to:  Wenda Low Primary Provider/Referring Provider:  Shaune Leeks MD  CC:  Pt is here for an acute sick visit.  Pt was recently seen by TP on 09/06/2009.  Daughter states pt's PCP switched his Lisinopril to Cozaar.  Daughter states pt has been having increased sob with any amount of exertion and coughing up clear to white sputum.  Daughter also states pt c/o "tickle/ itching in throat" and nasal congestion. Marland Kitchen  History of Present Illness: the pt comes in today for an acute sick visit.  His family member who cares for him feels he has had more cough and worsening doe.  She is worried he may be coming down with a chest cold.  The pt denies significant cough or mucus, but does have a tendency in the past to downplay his symptoms.  He does get sob easily, but has known severe emphysema.  He denies any purulence, fevers, chills, or sweats.  He has not had any chest pain or LE edema.  Current Medications (verified): 1)  Aspirin Ec 81 Mg Tbec (Aspirin) .... Take 2 Tablet By Mouth Once A Day 2)  Glimepiride 4 Mg Tabs (Glimepiride) .Marland Kitchen.. 1 By Mouth Once Daily 3)  Plavix 75 Mg Tabs (Clopidogrel Bisulfate) .Marland Kitchen.. 1 Daily By Mouth 4)  Oxygen 2 L .... 24/7 5)  Cozaar 50 Mg Tabs (Losartan Potassium) .... One Tab By Mouth Daily 6)  Proventil Hfa 108 (90 Base) Mcg/act Aers (Albuterol Sulfate) .... Inhale 2 Puffs Every 4 Hours As Needed 7)  Lasix 20 Mg  Tabs (Furosemide) .... Take 1 Tablet By Mouth Once A Day 8)  Klor-Con M20 20 Meq  Cr-Tabs (Potassium Chloride Crys Cr) .... Take 1 Tablet By Mouth Once A Day 9)  Zyrtec Allergy 10 Mg  Tabs (Cetirizine Hcl) .... Take 1 Tablet By Mouth Once A Day 10)  Delsym 30 Mg/33ml Lqcr (Dextromethorphan Polistirex) .... Take 2 Times Per Day As Needed 11)  Freestyle Lite Test  Strp (Glucose Blood) .... Use Once Daily To Monitor Diabetes 12)  Freestyle Lite  Devi (Blood Glucose Monitoring Suppl) .... Use Daily To  Monitor Diabetes 13)  Nyquil .... Take At Bedtime 14)  Adult Gummy Vitamins .... Once Daily 15)  Vitamin D 1000 Unit Tabs (Cholecalciferol) .... Once Daily 16)  Duoneb 0.5-2.5 (3) Mg/63ml Soln (Ipratropium-Albuterol) .... One Treatment At Breakfast, Lunch, Dinner, Bedtime Everyday....can Take 2 Additional Treatments A Day If A Bad Day 17)  Budesonide 0.5 Mg/32ml Susp (Budesonide) .... One Treatment Twice Daily 18)  Simvastatin 80 Mg Tabs (Simvastatin) .... Take 1 Tablet By Mouth Once A Day 19)  Hydromet 5-1.5 Mg/55ml Syrp (Hydrocodone-Homatropine) .... 1/2 Tsp Three Times A Day As Needed Cough, May Make You Sleepy /off Balance  Allergies (verified): 1)  ! Penicillin 2)  ! Biaxin 3)  Amoxicillin (Amoxicillin) 4)  * Penicillins Group  Review of Systems       The patient complains of shortness of breath with activity, productive cough, nasal congestion/difficulty breathing through nose, and change in color of mucus.  The patient denies shortness of breath at rest, non-productive cough, coughing up blood, chest pain, irregular heartbeats, acid heartburn, indigestion, loss of appetite, weight change, abdominal pain, difficulty swallowing, sore throat, tooth/dental problems, headaches, sneezing, itching, ear ache, anxiety, depression, hand/feet swelling, joint stiffness or pain, rash, and fever.    Vital Signs:  Patient profile:   77 year old male Height:  67 inches Weight:      177 pounds BMI:     27.82 O2 Sat:      90 % on 3 LPM pulsed Temp:     98.4 degrees F oral Pulse rate:   80 / minute BP sitting:   118 / 60  (left arm) Cuff size:   regular  Vitals Entered By: Arman Filter LPN (September 25, 2009 8:59 AM)  O2 Flow:  3 LPM pulsed CC: Pt is here for an acute sick visit.  Pt was recently seen by TP on 09/06/2009.  Daughter states pt's PCP switched his Lisinopril to Cozaar.  Daughter states pt has been having increased sob with any amount of exertion and coughing up clear to white sputum.   Daughter also states pt c/o "tickle/ itching in throat" and nasal congestion.  Comments Medications reviewed with patient Arman Filter LPN  September 25, 2009 9:01 AM    Physical Exam  General:  wd male in nad Nose:  no purulence or drainage Lungs:  rhonchi and pops in rll, otherwise clear no true wheezing Heart:  rrr, no mrg Extremities:  no edema or cyanosis Neurologic:  alert and oriented, moves all 4.   Impression & Recommendations:  Problem # 1:  CHRONIC OBSTRUCTIVE PULMONARY DISEASE, ACUTE EXACERBATION (ICD-491.21)  I suspect the pt is having a mild copd exacerbation.  Will treat with an abx and short burst of prednisone.  I have had a long discussion with pt and his family member, and explained that he has severe disease.  Part of his decline may be the natural course of this process, and suspect he will get worse over time.  I have urged him to stay as active as possible, and to try and walk 4 times a week.  Medications Added to Medication List This Visit: 1)  Levaquin 750 Mg Tabs (Levofloxacin) .... One tablet by mouth daily 2)  Prednisone 10 Mg Tabs (Prednisone) .... Take 4 each day for 2 days, then 3 each day for 2 days, then 2 each day for 2 days, then 1 each day for 2 days, then stop  Other Orders: Est. Patient Level IV ([address])  Patient Instructions: 1)  levaquin 750mg  one a day for 5 days 2)  prednisone taper as directed 3)  try to stay as active as possible 4)  keep scheduled followup with me, but call if not improving  Prescriptions: PREDNISONE 10 MG  TABS (PREDNISONE) take 4 each day for 2 days, then 3 each day for 2 days, then 2 each day for 2 days, then 1 each day for 2 days, then stop  #20 x 0   Entered and Authorized by:   Barbaraann Share MD   Signed by:   Barbaraann Share MD on 09/25/2009   Method used:   [id number]       [address]       [place], [place]  [address]       Ph: [phone]        Fax: [phone]   RxID:   [id number] LEVAQUIN 750 MG  TABS (LEVOFLOXACIN) One tablet by mouth daily  #5 x 0   Entered and Authorized by:   Barbaraann Share MD   Signed by:   Barbaraann Share MD on 09/25/2009   Method used:   [facility] to        [facility]  [address] [id number] (retail)       [address]       [place], [place]  [address]       Ph: [phone]       Fax: [phone]   RxID:   [id number]

## 2010-05-16 NOTE — Progress Notes (Signed)
Summary: neb question  Phone Note Call from Patient   Caller: Other Relative Call For: clance Summary of Call: daugeter in law of pt- bonnie- requests to speak to nurse re: neb. pt has an appt in [place] tomorrow w/ tp as he is coughing and bonnie says these breathing treatments have caused this to worsen. [phone] Initial call taken by: Tivis Ringer, CNA,  September 24, 2009 9:30 AM  Follow-up for Phone Call        Pt daughter-in-law [name] states pt has not felt well since starting on nebulizer medications. He was palce don these on 08-17-09 because at that time he was having increased SOb and the family requested a change to neb meds, but [name] states his cough and SOB are worse since starting onthe neb meds. he was treated by TP in 09-06-09 for a copd flare. he has finsihed all meds prescribed from that visit. His cough is still productive but the phlegm is now clear, he has less congestion, but still SOB and very weak. [name] is wondering if one of the neb meds is causing this? I asked if pt wants to go abck on inhalers, she states whatever [facility] recs. Please advise.Carron Curie CMA  September 24, 2009 10:06 AM  [facility] [address], [place]   Additional Follow-up for Phone Call Additional follow up Details #1::        from the note on 08/23/09, he and his daughter felt he was doing better on nebs.  I think he needs to be seen before a change is made again.  His breathing problem may have nothing to do with his emphysema? I need to see him tomorrow instead of TP Additional Follow-up by: Barbaraann Share MD,  September 24, 2009 5:31 PM    Additional Follow-up for Phone Call Additional follow up Details #2::    called and spoke with [name].  Scheduled pt to see [facility] tomorrow at 9am. Arman Filter LPN  September 24, 2009 5:36 PM

## 2010-05-28 ENCOUNTER — Telehealth: Payer: Self-pay | Admitting: Pulmonary Disease

## 2010-06-05 NOTE — Progress Notes (Signed)
Summary: returned call  Phone Note Call from Patient   Caller: daughter-in-law [name] Call For: [name] Summary of Call: returned call from dr Delcenia Inman. [phone] Initial call taken by: Tivis Ringer, CNA,  May 28, 2010 9:19 AM  Follow-up for Phone Call        done.  pt is doing better, will call if he does not continue to improve.  Has f/u scheduled with me. Follow-up by: Barbaraann Share MD,  May 28, 2010 1:57 PM

## 2010-06-07 ENCOUNTER — Telehealth: Payer: Self-pay | Admitting: Pulmonary Disease

## 2010-06-11 ENCOUNTER — Ambulatory Visit (INDEPENDENT_AMBULATORY_CARE_PROVIDER_SITE_OTHER): Payer: Medicare Other | Admitting: Pulmonary Disease

## 2010-06-11 ENCOUNTER — Encounter: Payer: Self-pay | Admitting: Pulmonary Disease

## 2010-06-11 DIAGNOSIS — J438 Other emphysema: Secondary | ICD-10-CM

## 2010-06-11 DIAGNOSIS — J9610 Chronic respiratory failure, unspecified whether with hypoxia or hypercapnia: Secondary | ICD-10-CM

## 2010-06-11 NOTE — Progress Notes (Signed)
Summary: refill budesonide  Phone Note Call from Patient   Caller: Daughter [name] Call For: [name] Summary of Call: pt needs refill of budesonide as the shipment through mailorder won't be available until sometime next wk. [facility] [address] [facility] [place] # [phone] Initial call taken by: Tivis Ringer, CNA,  June 07, 2010 8:57 AM  Follow-up for Phone Call        Caller states pt needs rx sent to local pharmacy since regular order is running late. Rx sent to pharmacy. [name] CMA  June 07, 2010 9:50 AM     Prescriptions: BUDESONIDE 0.5 MG/2ML SUSP (BUDESONIDE) one treatment twice daily  #60 x 1   Entered by:   [name] CMA   Authorized by:   Barbaraann Share MD   Signed by:   [name] CMA on 06/07/2010   Method used:   Electronically to        [id number]       [address]       [place], [place]  [address]       Ph: [phone]       Fax: [phone]   RxID:   [id number]

## 2010-06-12 ENCOUNTER — Ambulatory Visit: Payer: Self-pay | Admitting: Pulmonary Disease

## 2010-06-25 NOTE — Assessment & Plan Note (Signed)
Summary: rov for emphysema, chronic RF   Copy to:  Wenda Low Primary Provider/Referring Provider:  Shaune Leeks MD  CC:  6 month f/u appt.  Pt c/o coughing up thick white sputum but does take Mucinex for this.  States he still has sob with exertion.  Marland Kitchen  History of Present Illness: the pt comes in today for f/u of his known emphysema with chronic RF.  He has had a recent flareup, but has been treated and is returning to his usual baseline.  He reports mild cough with white mucus, but denies any purulence.  His exertional tolerance is near his usual baseline.  The daughter is concerned about his lack of activity.  Current Medications (verified): 1)  Aspirin Ec 81 Mg Tbec (Aspirin) .... Take 2 Tablet By Mouth Once A Day 2)  Glimepiride 4 Mg Tabs (Glimepiride) .Marland Kitchen.. 1 By Mouth Once Daily 3)  Plavix 75 Mg Tabs (Clopidogrel Bisulfate) .Marland Kitchen.. 1 Daily By Mouth 4)  Oxygen 3 L .... 24/7 5)  Cozaar 50 Mg Tabs (Losartan Potassium) .... One Tab By Mouth Daily 6)  Proventil Hfa 108 (90 Base) Mcg/act Aers (Albuterol Sulfate) .... Inhale 2 Puffs Every 4 Hours As Needed 7)  Lasix 20 Mg  Tabs (Furosemide) .... Take 1 Tablet By Mouth Once A Day 8)  Klor-Con M20 20 Meq  Cr-Tabs (Potassium Chloride Crys Cr) .... Take 1 Tablet By Mouth Once A Day 9)  Zyrtec Allergy 10 Mg  Tabs (Cetirizine Hcl) .... Take 1 Tablet By Mouth Once A Day 10)  Delsym 30 Mg/16ml Lqcr (Dextromethorphan Polistirex) .... Take 2 Times Per Day As Needed 11)  Freestyle Lite Test  Strp (Glucose Blood) .... Use Once Daily To Monitor Diabetes 12)  Freestyle Lite  Devi (Blood Glucose Monitoring Suppl) .... Use Daily To Monitor Diabetes 13)  Nyquil .... Take At Bedtime 14)  Adult Gummy Vitamins .... Once Daily 15)  Vitamin D 1000 Unit Tabs (Cholecalciferol) .... Once Daily 16)  Duoneb 0.5-2.5 (3) Mg/90ml Soln (Ipratropium-Albuterol) .... One Treatment At Breakfast, Lunch, Dinner, Bedtime Everyday....can Take 2 Additional Treatments A Day If  A Bad Day 17)  Budesonide 0.5 Mg/36ml Susp (Budesonide) .... One Treatment Twice Daily 18)  Simvastatin 80 Mg Tabs (Simvastatin) .... Take 1/2  Tablet By Mouth Once A Day 19)  Hydromet 5-1.5 Mg/63ml Syrp (Hydrocodone-Homatropine) .... 1/2 Tsp Three Times A Day As Needed Cough, May Make You Sleepy /off Balance 20)  Aricept 10 Mg Tabs (Donepezil Hcl) .... One Tab By Mouth Once Daily 21)  Mucinex Dm Maximum Strength 60-1200 Mg Xr12h-Tab (Dextromethorphan-Guaifenesin) .Marland Kitchen.. 1 Two Times A Day As Needed 22)  Ipratropium Bromide 0.06 % Soln (Ipratropium Bromide) .... 2 Each Nostril Each Am 23)  Flutter Valve .... Twice A Day 24)  Melatonin 3-10 Mg Tabs (Melatonin-Pyridoxine) .... Take One By Mouth At Bedtime 25)  Namenda 10 Mg Tabs (Memantine Hcl) .... One Tab By Mouth Two Times A Day  Allergies (verified): 1)  ! Penicillin 2)  ! Biaxin 3)  Amoxicillin (Amoxicillin) 4)  * Penicillins Group  Review of Systems       The patient complains of shortness of breath with activity and productive cough.  The patient denies shortness of breath at rest, non-productive cough, coughing up blood, chest pain, irregular heartbeats, acid heartburn, indigestion, loss of appetite, weight change, abdominal pain, difficulty swallowing, sore throat, tooth/dental problems, headaches, nasal congestion/difficulty breathing through nose, sneezing, itching, ear ache, anxiety, depression, hand/feet swelling, joint stiffness or pain,  rash, change in color of mucus, and fever.    Vital Signs:  Patient profile:   77 year old male Height:      67 inches Weight:      184.38 pounds BMI:     28.98 O2 Sat:      94 % on 3 LPM cont Temp:     98.2 degrees F oral Pulse rate:   68 / minute BP sitting:   130 / 64  (left arm) Cuff size:   regular  Vitals Entered By: Arman Filter LPN (June 11, 2010 2:01 PM)  O2 Flow:  3 LPM cont CC: 6 month f/u appt.  Pt c/o coughing up thick white sputum but does take Mucinex for this.  States  he still has sob with exertion.   Comments checked pt's oxygen sats off of oxygen and on room air.  o2 sats were 88% on RA at rest.  Medications reviewed with patient Arman Filter LPN  June 11, 2010 2:09 PM    Physical Exam  General:  wd male in nad  Lungs:  decreased bs throughout, no wheezing Heart:  distant, but regular Extremities:  minimal right ankle swelling, no cyanosis  Neurologic:  pleasantly confused, moves all 4 .   Impression & Recommendations:  Problem # 1:  EMPHYSEMA (ICD-492.8) the pt is on a good bronchodilator regimen, and is returning to his usual baseline after a recent exacerbation.  I have stressed to him the importance of staying on his meds and oxygen, and to try and stay as active as possible.  I have offered to refer him back to pulmonary rehab program, but he wants to think about it.    Problem # 2:  CHRONIC RESPIRATORY FAILURE (ICD-518.83) adequate sats on supplemental oxygen   Other Orders: Est. Patient Level III ([address])  Patient Instructions: 1)  no change in meds 2)  stay as active as possible 3)  followup with me in 4mos

## 2010-07-21 LAB — COMPREHENSIVE METABOLIC PANEL
ALT: 17 U/L (ref 0–53)
AST: 24 U/L (ref 0–37)
Alkaline Phosphatase: 75 U/L (ref 39–117)
BUN: 17 mg/dL (ref 6–23)
CO2: 27 mEq/L (ref 19–32)
Calcium: 8.8 mg/dL (ref 8.4–10.5)
Chloride: 100 mEq/L (ref 96–112)
Creatinine, Ser: 1.13 mg/dL (ref 0.4–1.5)
GFR calc Af Amer: >60 mL/min (ref >60)
GFR calc non Af Amer: 59 mL/min — ABNORMAL LOW (ref >60)
GFR calc non Af Amer: >60 mL/min (ref >60)
Glucose, Bld: 102 mg/dL — ABNORMAL HIGH (ref 70–99)
Potassium: 4.2 mEq/L (ref 3.5–5.1)
Sodium: 134 mEq/L — ABNORMAL LOW (ref 135–145)
Sodium: 135 mEq/L (ref 135–145)
Total Bilirubin: 0.7 mg/dL (ref 0.3–1.2)
Total Protein: 6.9 g/dL (ref 6.0–8.3)

## 2010-07-21 LAB — CBC
HCT: 39.1 % (ref 39.0–52.0)
Hemoglobin: 13.3 g/dL (ref 13.0–17.0)
Hemoglobin: 13.6 g/dL (ref 13.0–17.0)
MCHC: 34.1 g/dL (ref 30.0–36.0)
MCV: 94.6 fL (ref 78.0–100.0)
RBC: 4.18 MIL/uL — ABNORMAL LOW (ref 4.22–5.81)
RDW: 13.7 % (ref 11.5–15.5)
WBC: 8.5 10*3/uL (ref 4.0–10.5)

## 2010-07-21 LAB — CARDIAC PANEL(CRET KIN+CKTOT+MB+TROPI)
Relative Index: INVALID (ref 0.0–2.5)
Total CK: 81 U/L (ref 7–232)
Troponin I: 0.03 ng/mL (ref 0.00–0.06)

## 2010-07-21 LAB — GLUCOSE, CAPILLARY
Glucose-Capillary: 119 mg/dL — ABNORMAL HIGH (ref 70–99)
Glucose-Capillary: 174 mg/dL — ABNORMAL HIGH (ref 70–99)
Glucose-Capillary: 217 mg/dL — ABNORMAL HIGH (ref 70–99)
Glucose-Capillary: 151 mg/dL — ABNORMAL HIGH (ref 70–99)

## 2010-07-21 LAB — URINE CULTURE
Colony Count: NO GROWTH
Culture: NO GROWTH

## 2010-07-21 LAB — LIPID PANEL
HDL: 41 mg/dL (ref >39)
Total CHOL/HDL Ratio: 3.3 RATIO
VLDL: 14 mg/dL (ref 0–40)

## 2010-07-21 LAB — CK TOTAL AND CKMB (NOT AT ARMC): CK, MB: 1.4 ng/mL (ref 0.3–4.0)

## 2010-07-21 LAB — FOLATE: Folate: >20 ng/mL

## 2010-07-21 LAB — VITAMIN B12 BINDING CAPACITY, BLOOD: Vitamin B12 Bind Capacity: 2358 pg/mL — ABNORMAL HIGH (ref 650–1340)

## 2010-07-21 LAB — PROTIME-INR
INR: 1.1 (ref 0.00–1.49)
Prothrombin Time: 14.3 seconds (ref 11.6–15.2)
Prothrombin Time: 14.8 seconds (ref 11.6–15.2)

## 2010-07-21 LAB — POCT CARDIAC MARKERS: CKMB, poc: 1.4 ng/mL (ref 1.0–8.0)

## 2010-07-21 LAB — DIFFERENTIAL
Basophils Relative: 0 % (ref 0–1)
Eosinophils Absolute: 0.2 10*3/uL (ref 0.0–0.7)
Eosinophils Relative: 2 % (ref 0–5)
Lymphs Abs: 0.8 10*3/uL (ref 0.7–4.0)

## 2010-07-21 LAB — URINALYSIS, ROUTINE W REFLEX MICROSCOPIC
Bilirubin Urine: NEGATIVE
Protein, ur: NEGATIVE mg/dL
Specific Gravity, Urine: 1.012 (ref 1.005–1.030)
Urobilinogen, UA: 0.2 mg/dL (ref 0.0–1.0)

## 2010-07-21 LAB — TROPONIN I: Troponin I: 0.03 ng/mL (ref 0.00–0.06)

## 2010-07-21 LAB — HEMOGLOBIN A1C
Hgb A1c MFr Bld: 6.5 % — ABNORMAL HIGH (ref 4.6–6.1)
Mean Plasma Glucose: 140 mg/dL

## 2010-07-29 ENCOUNTER — Encounter: Payer: Self-pay | Admitting: Family Medicine

## 2010-07-29 ENCOUNTER — Telehealth: Payer: Self-pay | Admitting: *Deleted

## 2010-07-29 NOTE — Telephone Encounter (Signed)
Pt would really need to be seen. He really should have bloodwork to insure not a metabolic problem, sugar out of control, low potassium, anemic, etc. Cannot prescribe medicine to control mood and agitation without being evaluated. They might try decreasing Aricept to 5mg  (1/2 tab) a day.

## 2010-07-29 NOTE — Telephone Encounter (Signed)
Left message on voice mail for daughter to call back.

## 2010-07-29 NOTE — Telephone Encounter (Signed)
Daughter says that patients dementia has gotten worse and he has been very hard to handle at times, very hard to reason with. She says that there is no one that will be able to bring him in for an appt, she is asking if there is something that could be called in to help calm him. Advised her that he would likely need an appt. For this.

## 2010-07-29 NOTE — Telephone Encounter (Signed)
Patient's daughter Karena Addison notified as instructed by telephone. Appointment scheduled for patient to see Dr. Hetty Ely tomorrow 07/30/10.

## 2010-07-30 ENCOUNTER — Encounter: Payer: Self-pay | Admitting: Family Medicine

## 2010-07-30 ENCOUNTER — Ambulatory Visit (INDEPENDENT_AMBULATORY_CARE_PROVIDER_SITE_OTHER): Payer: Medicare Other | Admitting: Family Medicine

## 2010-07-30 DIAGNOSIS — I10 Essential (primary) hypertension: Secondary | ICD-10-CM

## 2010-07-30 DIAGNOSIS — R5381 Other malaise: Secondary | ICD-10-CM

## 2010-07-30 DIAGNOSIS — J441 Chronic obstructive pulmonary disease with (acute) exacerbation: Secondary | ICD-10-CM

## 2010-07-30 DIAGNOSIS — IMO0002 Reserved for concepts with insufficient information to code with codable children: Secondary | ICD-10-CM

## 2010-07-30 DIAGNOSIS — E119 Type 2 diabetes mellitus without complications: Secondary | ICD-10-CM

## 2010-07-30 DIAGNOSIS — R451 Restlessness and agitation: Secondary | ICD-10-CM | POA: Insufficient documentation

## 2010-07-30 NOTE — Progress Notes (Signed)
  Subjective:    Patient ID: [name], male    DOB: Feb 18, 1925, 77 y.o.   MRN: [id number]  HPI Pt here with his wife for difficulty getting along at home. There was a problem with the son wanting to get rid of  a pile of wood at home and the pt reportedly said some things that got the  Son mad. The whole situation escalated and Mr Pepper said some ugly things the son took issue with . Mr Ungaro remembers the interaction.  The son has been doing lots of work around the house for quite a while and  is the one who helps them the most. His wife is the daughter in law who usually brings him here but she is currently in the hospital. The outbursts are not with any particular person nor any particular time of day. He does not seem lightheaded at all and has no complaints, feels well and is "as fine as frog's hair."  Review of Systems Noncontributory except as above.       Objective:   Physical Exam  Constitutional: He appears well-developed and well-nourished. No distress.  HENT:  Head: Normocephalic and atraumatic.  Right Ear: External ear normal.  Left Ear: External ear normal.  Nose: Nose normal.  Mouth/Throat: Oropharynx is clear and moist.  Eyes: Conjunctivae and EOM are normal. Pupils are equal, round, and reactive to light. Right eye exhibits no discharge. Left eye exhibits no discharge.  Neck: Normal range of motion. Neck supple.  Cardiovascular: Normal rate and regular rhythm.   Pulmonary/Chest: Effort normal. He has wheezes (fine wheezes throughout, good oxygenation.).  Lymphadenopathy:    He has no cervical adenopathy.  Skin: He is not diaphoretic.  Psychiatric: He has a normal mood and affect. His behavior is normal.       Very jovial as usual.          Assessment & Plan:

## 2010-07-30 NOTE — Assessment & Plan Note (Signed)
Seems to be general reaction that unfortunately looks to be a part of the pt's personality. Will try decreasing the dose of Aricept in hopes of improving the pt's outlook.  He does not appear to have a metabolic problem but will sample blood just to be sure. If no help, may try adding Sertraline in small dose.

## 2010-07-30 NOTE — Patient Instructions (Signed)
Labwork today. Will report on phonetree. Try decreasing Aricept to 1/2 tab daily. Call in a few weeks if not helping with agitation.

## 2010-07-31 LAB — CBC WITH DIFFERENTIAL/PLATELET
Eosinophils Absolute: 0.3 10*3/uL (ref 0.0–0.7)
Eosinophils Relative: 2.8 % (ref 0.0–5.0)
Lymphocytes Relative: 11 % — ABNORMAL LOW (ref 12.0–46.0)
MCV: 94.6 fl (ref 78.0–100.0)
Monocytes Absolute: 0.5 10*3/uL (ref 0.1–1.0)
Neutrophils Relative %: 82 % — ABNORMAL HIGH (ref 43.0–77.0)
Platelets: 195 10*3/uL (ref 150.0–400.0)
WBC: 12.1 10*3/uL — ABNORMAL HIGH (ref 4.5–10.5)

## 2010-07-31 LAB — HEPATIC FUNCTION PANEL
Alkaline Phosphatase: 73 U/L (ref 39–117)
Bilirubin, Direct: 0 mg/dL (ref 0.0–0.3)
Total Bilirubin: 0.4 mg/dL (ref 0.3–1.2)

## 2010-07-31 LAB — TSH: TSH: 1.6 u[IU]/mL (ref 0.35–5.50)

## 2010-07-31 LAB — MICROALBUMIN / CREATININE URINE RATIO
Creatinine,U: 81.4 mg/dL
Microalb Creat Ratio: 4.2 mg/g (ref 0.0–30.0)
Microalb, Ur: 3.4 mg/dL — ABNORMAL HIGH (ref 0.0–1.9)

## 2010-07-31 LAB — RENAL FUNCTION PANEL
Albumin: 4.5 g/dL (ref 3.5–5.2)
BUN: 25 mg/dL — ABNORMAL HIGH (ref 6–23)
CO2: 28 mEq/L (ref 19–32)
Calcium: 9.6 mg/dL (ref 8.4–10.5)
Chloride: 101 mEq/L (ref 96–112)
Phosphorus: 3.2 mg/dL (ref 2.3–4.6)
Potassium: 4.6 mEq/L (ref 3.5–5.1)

## 2010-07-31 LAB — VITAMIN B12: Vitamin B-12: 642 pg/mL (ref 211–911)

## 2010-08-09 ENCOUNTER — Telehealth: Payer: Self-pay | Admitting: *Deleted

## 2010-08-09 NOTE — Telephone Encounter (Signed)
Daughter in law called to schedule appt for today because patient has no energy, or drive to do anything, but we have no available appts. I offered the Saturday clinic, but she refused. She said that she would take him to an UC.

## 2010-08-12 NOTE — Telephone Encounter (Signed)
Noted. Thank You.

## 2010-08-13 ENCOUNTER — Encounter: Payer: Self-pay | Admitting: Internal Medicine

## 2010-08-13 ENCOUNTER — Ambulatory Visit (INDEPENDENT_AMBULATORY_CARE_PROVIDER_SITE_OTHER): Payer: Medicare Other | Admitting: Internal Medicine

## 2010-08-13 ENCOUNTER — Other Ambulatory Visit (INDEPENDENT_AMBULATORY_CARE_PROVIDER_SITE_OTHER): Payer: Medicare Other

## 2010-08-13 VITALS — BP 124/54 | HR 67 | Temp 97.9°F | Wt 180.0 lb

## 2010-08-13 DIAGNOSIS — D72829 Elevated white blood cell count, unspecified: Secondary | ICD-10-CM

## 2010-08-13 LAB — CBC WITH DIFFERENTIAL/PLATELET
Basophils Relative: 0.3 % (ref 0.0–3.0)
Eosinophils Relative: 6.5 % — ABNORMAL HIGH (ref 0.0–5.0)
Hemoglobin: 14 g/dL (ref 13.0–17.0)
Lymphocytes Relative: 14.5 % (ref 12.0–46.0)
MCV: 94.6 fl (ref 78.0–100.0)
Neutrophils Relative %: 69.7 % (ref 43.0–77.0)
RBC: 4.4 Mil/uL (ref 4.22–5.81)
WBC: 7.2 10*3/uL (ref 4.5–10.5)

## 2010-08-13 MED ORDER — FLUTICASONE PROPIONATE 50 MCG/ACT NA SUSP: 2.0000 | Freq: Every day | NASAL | Status: DC

## 2010-08-13 NOTE — Progress Notes (Signed)
Subjective:    Patient ID: [name], male    DOB: March 04, 1925, 77 y.o.   MRN: [id number]  HPI [name] presents to establish for on-going continuity care in transfer from Dr. Hetty Ely.   Last OV to Dr. Hetty Ely his WBC was 12k. Recently he was feeling bad and went to urgent care -[facility]  Friday, April 27th. He had a WBC 21k, cxr was clear, u/a/ negative and negative culture. He was empirically treated with levaquin 500mg  daily. Per the patient and his daughter he is feeling better at today's visit. He has no fever, no cough, no GU symptoms.   Past Medical History  Diagnosis Date  . Bradycardia   . dementia   . mass, lung right lilum     patient had an abnormal CT chest 2+ years ago after infection: changes thougth to be a reaction and did clear.   . Shortness of breath   . weakness   . Chronic respiratory failure   . Obstructive sleep apnea (adult) (pediatric)   . allergi rhin, chronic   . Emphysema   . inguinal hernia, right   . Other specified disorder of skin   . COPD (chronic obstructive pulmonary disease)   . varicose vein     mild  . Hypertension   . coronary artery disease   . History of cardiac cath 06/03/04    acute info post MI, stent PICA  . History of cardiac cath 06/03/04    acute info post MI, stent PICA  . Asthma   . diabetes mell, type 2   . Pure hypercholesterolemia    Past Surgical History  Procedure Date  . Cholecystectomy 01/26/03-01/31/2003     [facility] ERCP, cholecystitis  . Stress/adenosine myoview 03/25/04    abnormal EF 50%  . Total hip arthroplasty 02/06    Left, anemia 2 units PRBC  . Adenosine myoview 08/13/05    EF 46%  . Total hip arthroplasty 05/31/05    right  . Ct angio chest 10/28/08    Neg PE, 3x3x2.2 cm right hilar mass  . Pet scan 11/02/08    Inflam/Infect Atelect RUL w/reactive lymphadenopathy  . Hernia repair 01/00    left recurrent   Family History  Problem Relation Age of Onset  . Cancer Sister     breast    . Diabetes Brother   . Hypertension Brother   . Hyperlipidemia Brother   . Parkinsonism Brother   . Cancer Sister    History   Social History  . Marital Status: Married    Spouse Name: N/A    Number of Children: 2  . Years of Education: 8   Occupational History  . 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     Social History Main Topics  . Smoking status: Former Smoker    Quit date: 08/12/1960  . Smokeless tobacco: Never Used   Comment: quit over 30 years ago  . Alcohol Use: No  . Drug Use: No  . Sexually Active: Not Currently   Other Topics Concern  . Not on file   Social History Narrative   Finished 8th grade. Married - 1946 -  . 1 son - '1; 1 dtr - '55;  3 grandchildren. 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  Lives alone with wife. End-of-Life Measures: no Cardiac resuscitation, no mechanical  ventilation, no heroic or futile measures.        Review of Systems Constitutional - no fever, does feel weak and fatigue HEENT- nasal congestion with postnasal drainage PUl- occasional productive cough. No increased SOB/DOE. He remains O2 dependent Cor- no complaints GI- no complaints     Objective:   Physical Exam WNWD white male on oxygen, no distress HEENT- [place]/at, C&S clear Neck -supple Chest - distant and reduced breath sounds. No wheezing Cor - RRR       Assessment & Plan:  1. Leukocytosis - patient with no apparent signs of infection. He complains of his nose being blocked and of post-nasal drainage. Exam is unrevealing.  Plan - he is to finish full course of levaquin           Follow-up CBC today  Addendum:  Lab Results  Component Value Date   WBC 7.2 08/13/2010   HGB 14.0 08/13/2010   HCT 41.6 08/13/2010   MCV 94.6 08/13/2010   PLT 218.0 08/13/2010    Resolution of leukocytosis.

## 2010-08-16 ENCOUNTER — Telehealth: Payer: Self-pay | Admitting: Internal Medicine

## 2010-08-16 NOTE — Telephone Encounter (Signed)
Pt is requesting results of labwork done on Tuesday; particularly his WBC.

## 2010-08-16 NOTE — Telephone Encounter (Signed)
White vlood count is normal at 7.2, Hgb 14.- this is good.

## 2010-08-19 ENCOUNTER — Other Ambulatory Visit: Payer: Self-pay | Admitting: Internal Medicine

## 2010-08-27 NOTE — H&P (Signed)
NAMEGEOVANIE, WINNETT                 ACCOUNT NO.:  [id number]   MEDICAL RECORD NO.:  [id number]          PATIENT TYPE:  INP   LOCATION:  1824                         FACILITY:  [facility]   PHYSICIAN:  Michiel Cowboy, MDDATE OF BIRTH:  09-26-1924   DATE OF ADMISSION:  10/28/2008  DATE OF DISCHARGE:                              HISTORY & PHYSICAL   PRIMARY CARE Erminie Foulks:  Arta Silence, M.D.   CHIEF COMPLAINT:  Chest pain.   Patient is an 77 year old gentleman with a past medical history  significant for COPD and coronary artery disease as well as dementia.  Per records, he had a non-drug-eluting stent placed in 2005 after  radiologist workup showed coronary artery disease.  This was  perioperative workup for a hip replacement.  At that point, he went off  his Plavix, got his hip replaced, and had an end-stent thrombosis, which  required repeating stenting and resulting in inferior and posterior  myocardial infarctions.  Since then, he has not had any chest pain.  He  does have continued shortness of breath, which is stable for him.  He is  on 2 liters of oxygen secondary to his COPD.  He has fairly significant  dementia, unable to give me a very good history, but per him and his  family, what I gathered is for the past few days, he has been having  intermittent right-sided chest pain.  He had been occasionally grabbing  his right chest, and it hurts.  When I tried to question him, he says it  is just a hurting pain, he cannot tell me what kind of quality it is.  Not sure if it is getting worse with deep breathing.  This seems to be  coming and going but has been with him for the past, at least 48 hours.  It seems like he had it since he woke up this morning, at least for  sure, although I think he was also evaluated for this yesterday at his  primary care office, where he had an EKG done and cardiac markers and  was reassured and sent home but also told if this chest pain  persisted,  to come to the emergency room, which at this point they did.  His chest  pain is very reproducible by palpation and sometimes goes into the right  shoulder as well.   Otherwise, per family, no fevers, no chills, no diarrhea, no vomiting.  Patient is chronically short of breath with dyspnea on exertion, which  is at his baseline.  Otherwise, review of systems is unremarkable.   PAST MEDICAL HISTORY:  1. Significant for coronary artery disease, status post MI.  2. COPD.  3. Diabetes.  4. Hyperlipidemia.  5. Hypertension.  6. Sleep apnea, poor compliance with CPAP.  7. Dementia.   SOCIAL HISTORY:  Patient used to smoke.  Quit when he was around in his  23s.  Has smoked for at least 20 years or so.  Does not drink.  Does not  abuse drugs.   FAMILY HISTORY:  Noncontributory.   ALLERGIES:  PENICILLIN.  MEDICATIONS:  1. Glimepiride 4 mg daily.  2. Lasix 20 daily.  3. Lipitor 40 daily.  4. Lisinopril 5 mg daily.  5. Plavix 75 mg daily.  6. Aspirin 81 mg daily.  7. Potassium.  8. Symbicort 160/4.5 inhaled twice daily.   PHYSICAL EXAMINATION:  VITALS:  Temperature 100, blood pressure 118/58,  pulse 66, respirations 22, satting 97% on room air.  Patient appears to be currently in no acute distress.  Head nontraumatic.  Slightly dry mucous membranes but normal skin  turgor.  LUNGS:  Clear to auscultation but somewhat distant breath sounds  throughout.  No wheezes or rales appreciated.  HEART:  Regular rate and rhythm.  No murmurs appreciated.  ABDOMEN:  Soft, nontender, nondistended.  LOWER EXTREMITIES:  Without clubbing, cyanosis or edema.  NEUROLOGIC:  Intact.  SKIN:  Clean, dry, and intact.   LABS:  White blood cell count 8.5, hemoglobin 13.6.  Sodium 134,  potassium 4.2, albumin 3.1, myoglobin slightly elevated to 10.  Troponin  normal.  UA unremarkable.   Chest x-ray shows COPD but no active disease.   INR 1.1.   ASSESSMENT/PLAN:  This is a 77 year old  gentleman with known history of  coronary artery disease, referred by emergency department for rule-out  myocardial infarction, given chest pain.  1. Chest pain: Extremely atypical, reproducible, and right-sided.  He      did not have any chest pain with his prior myocardial infarction      but given advanced age and history of hypertension, hyperlipidemia,      no history of coronary artery disease, will admit and cycle cardiac      markers.  Check fasting lipid panel, hemoglobin A1C, serial EKGs.      Given that the pain is reproducible, per patient, likely      musculoskeletal in origin, chest x-ray did not show any rib      fractures or musculoskeletal abnormalities.  Would check right      shoulder film if no right upper quadrant tenderness.  Will also      check a D-dimer for completion sake, since he cannot tell me if the      chest pain is pleuritic or not in nature.  2. History of diabetes:  Will put on sliding-scale insulin, hold      glimepiride for now.  3. Hyperlipidemia:  Continue Lipitor.  4. History of coronary artery disease:  Continue Plavix and aspirin.  5. Prophylaxis:  Protonix/Lovenox.  6. Cardiolite stress test:  Full code.     Michiel Cowboy, MD  Electronically Signed    AVD/MEDQ  D:  10/28/2008  T:  10/28/2008  Job:  [id number]   cc:   Arta Silence, MD

## 2010-08-27 NOTE — Assessment & Plan Note (Signed)
[facility] OFFICE NOTE   JABRE, HEO                        MRN:          [id number]  DATE:09/16/2007                            DOB:          04-18-1924    PRIMARY CARE PHYSICIAN:  Arta Silence, MD.   INTERVAL HISTORY:  [name] is a very pleasant 77 year old male with  history of coronary artery disease status post PTCA and stenting of the  third marginal was a nondrug-eluting stent in September of 2005.  This  was complicated by in-stent thrombosis after the discontinuation of his  Plavix for surgery resulting in acute inferior posterior myocardial  infarction.  His ejection fraction has been in the 45-50% range.  He  also has a history of severe COPD on home oxygen, diabetes, hypertension  and hyperlipidemia.  There is also some question of progressive early  onset of progressive dementia.   He is here today with his daughter.  Overall he says he does what he  wants without any limitation but his daughter says that he is quite  limited.  He gets short of breath with just mild activity.  He is not  wearing his oxygen really except at night.  She also is aware that he is  getting increasingly forgetful.  He denies orthopnea, but he has been  really sleepy in the recliner.  The daughter says he does get some lower  extremity edema in his ankles.  She is also concerned about the fact  that he wheezes a lot.  She has now gotten him to take his albuterol  more often.  He has not had any chest pain.   CURRENT MEDICATIONS:  1. Are aspirin 81.  2. Plavix 75.  3. Toprol 25.  4. Amaryl 4 mg a day.  5. Spiriva inhaler.  6. Simvastatin 80 a day.  7. Zyrtec 10 a day.  8. Lisinopril 5 a day.  9. Symbicort.   PHYSICAL EXAM:  GENERAL:  He is an elderly male in no acute distress.  Ambulates around the clinic slowly without any respiratory difficulty.  VITAL SIGNS:  Blood pressure is 108/64, heart rate  is 50.  HEENT:  Is normal.  NECK:  Neck is supple.  No obvious JVD.  Carotids are 2+ bilaterally  without bruits.  There is no lymphadenopathy or thyromegaly.  CARDIAC:  He has very distant heart sounds.  His PMI is not palpable.  He is bradycardic and regular.  I do not hear any murmurs.  LUNGS:  Lungs have decreased air movement throughout with prolonged  expiratory phase.  No wheezing.  ABDOMEN:  Soft, nontender, nondistended.  There is no  hepatosplenomegaly.  No bruits, no masses.  Good bowel sounds.  EXTREMITIES:  Warm with no cyanosis or clubbing.  There is trace edema.  No rash.  NEUROLOGICAL:  Alert and oriented.  Cranial nerves II-XII are intact.  Moves all four extremities without difficulty.  Affect is pleasant.   EKG shows sinus bradycardia at a rate of 50, mild T-wave flattening in  the high lateral  wall.   ASSESSMENT AND PLAN:  1. Coronary artery disease.  This is stable.  No evidence of ischemia.      Continue current therapy.  2. Lower extremity edema.  I think this is mostly due to venous      insufficiency.  I asked him to wear compression stockings and keep      his legs up.  We have also given him 20 mg of Lasix to use p.r.n.  3. Chronic obstructive pulmonary disease.  This is fairly severe.  I      have once again encouraged him to use his oxygen for any type of      activity.  He is very reluctant about this.  4. Hyperlipidemia.  Goal LDL is less than 70.  This is followed by Dr.      Hetty Ely.  Continue simvastatin.   DISPOSITION:  We will see him back in clinic in about 4 months for  routine followup.     [name], MD  Electronically Signed    DRB/MedQ  DD: 09/16/2007  DT: 09/16/2007  Job #: [id number]   cc:   Arta Silence, MD

## 2010-08-27 NOTE — Consult Note (Signed)
NAMEBENJAMYN, [name] NO.:  [id number]   MEDICAL RECORD NO.:  [id number]          PATIENT TYPE:  INP   LOCATION:  3741                         FACILITY:  [facility]   PHYSICIAN:  Barbaraann Share, MD,FCCPDATE OF BIRTH:  11-19-26   DATE OF CONSULTATION:  10/30/2008  DATE OF DISCHARGE:  10/30/2008                                 CONSULTATION   REFERRING PHYSICIAN:  Rosalyn Gess. Norins, MD   HISTORY OF PRESENT ILLNESS:  The patient is an 77 year old white male,  known to me with moderate-to-severe emphysema as well as obstructive  sleep apnea, who I have been asked to see for a right hilar mass.  The  patient was admitted with right-sided chest pain and was found to have  negative cardiac enzymes as well as no pulmonary embolus by CT of chest.  However, he was found to have a right hilar mass with some compression  of the right upper lobe bronchus and medial subsegmental atelectasis.  There was also precarinal and subcarinal lymphadenopathy noted.  The  patient this time denies weight loss, hemoptysis, or worsening cough.  He denies purulence, fevers, chills, or sweats.  His breathing is a  little worse that his usual baseline, but not overly so.   PAST MEDICAL HISTORY:  1. Moderate-to-severe COPD with an FEV-1 of 0.94 L in 2007.  2. History of sleep apnea with CPAP intolerance.  He is being treated      with nocturnal HS.  3. History of coronary artery disease, status post MI.  4. History of diabetes.  5. History of dyslipidemia.  6. History of hypertension.  7. History of mild dementia.   The patient is allergic to PENICILLIN.   SOCIAL HISTORY:  He has a long history of tobacco use, but has not  smoked in many years.   FAMILY HISTORY:  Noncontributory.   REVIEW OF SYSTEMS:  As per history of present illness.   PHYSICAL EXAMINATION:  GENERAL:  He is a well-developed male in no acute  distress.  VITAL SIGNS:  Blood pressure is 100/53, pulse is 63, temperature  is  98.4, respiratory rate 18, 2 L of sat is 93%.  HEENT:  Pupils are equal, round, and reactive to light and  accommodation.  Extraocular motions are intact.  Nares are patent  without discharge.  Oropharynx is clear.  NECK:  Supple without JVD or lymphadenopathies.  No palpable  thyromegaly.  CHEST:  Very decreased breath sounds throughout with no wheezing.  Scattered crackles are noted.  CARDIAC:  Regular rate and rhythm with no murmurs, rubs or gallops.  ABDOMEN:  Soft, nontender with good bowel sounds.  GENITAL, RECTAL, AND BREAST:  Not done and not indicated.  LOWER EXTREMITIES:  Trace to 1+ ankle edema.  Pulses are intact  distally, but diminished.  NEUROLOGIC:  He is alert and oriented.  Moves all four extremities.   IMPRESSION:  Right hilar mass that is suspicious for bronchogenic  cancer.  He has evidence for airway collapse, but does not have a lot of  compressive effect.  I suspect the images  were done on exhalation with  dynamic airway collapse.  He is a poor surgical candidate because of his  significant emphysema, but could tolerate chemotherapy and radiation if  desired.  Workup and diagnosis can be done as an outpatient if felt to  be medically stable.   PLAN:  1. PET scan as an outpatient.  2. After the PET, we will decide about the approach to tissue      diagnosis.  I will be happy to see him and follow up to arrange all      of this.      Barbaraann Share, MD,FCCP  Electronically Signed     KMC/MEDQ  D:  10/30/2008  T:  10/30/2008  Job:  [id number]   cc:   Rosalyn Gess. Norins, MD  Arta Silence, MD

## 2010-08-27 NOTE — Discharge Summary (Signed)
NAMEDAXX, TIGGS                 ACCOUNT NO.:  [id number]   MEDICAL RECORD NO.:  [id number]          PATIENT TYPE:  INP   LOCATION:  3741                         FACILITY:  [facility]   PHYSICIAN:  Rosalyn Gess. Norins, MD  DATE OF BIRTH:  Aug 15, 1924   DATE OF ADMISSION:  10/28/2008  DATE OF DISCHARGE:  10/30/2008                               DISCHARGE SUMMARY   ADMITTING DIAGNOSES:  1. Chest pain.  2. Diabetes.  3. Hyperlipidemia.  4. Chronic obstructive pulmonary disease.  5.   Dictation ended at this point.      Rosalyn Gess Norins, MD  Electronically Signed     MEN/MEDQ  D:  10/30/2008  T:  10/31/2008  Job:  [id number]

## 2010-08-27 NOTE — Assessment & Plan Note (Signed)
[facility]                            CARDIOLOGY OFFICE NOTE   KEIONDRE, COLEE                        MRN:          [id number]  DATE:10/12/2006                            DOB:          04-Dec-1924    PRIMARY CARE PHYSICIAN:  Arta Silence, M.D.   INTERVAL HISTORY:  Mr. Kryder is a [age]-year[age] male with a  history of coronary artery disease, status post PTCA and stenting of his  third marginal with a non-drug-eluting stent in September 2005.  This  was complicated by stent thrombosis, after discontinuation of his Plavix  resulting in an acute inferior posterior myocardial infarction.  He was  treated with repeat stenting.   The remainder of his medical history is notable for:  1. Diabetes.  2. Hypertension.  3. Hyperlipidemia.  4. COPD.  5. Bradycardia.  6. Bilateral hip replacements.   He returns today for routine followup.  At his last visit, he was  complaining of a fairly significant dyspnea.  I had a high concern for  obstructive sleep apnea.  He went and saw Dr. Shelle Iron and actually failed  his sleep study with nocturnal desaturations down to 81% with heavy  snoring.  He also had a Myoview which showed an EF of 50% with a  previous inferolateral infarct but no ischemia.  And, a Holter monitor  showed sinus rhythm with wandering atrial pacemaker and occasional PVCs.  His maximum heart rate was only 93 beats per minute, minimal heart rate  was 47 suggestive of chronotropic incompetence.  He returns today for  routine followup.  He continues to be short of breath with occasional  fine wheezing.  He denies orthopnea, PND, no lower extremity edema, no  chest pain.   CURRENT MEDICATIONS:  1. Altace 5 mg a day.  2. Aspirin 81.  3. Plavix 75.  4. Toprol XL 25.  5. Amaryl 4 mg a day.  6. Spiriva.  7. Simvastatin 80.   PHYSICAL EXAMINATION:  GENERAL:  He is an elderly male in no acute  distress, ambulates around the clinic  slowly.  Respirations are  unlabored.  When I walk him around the clinic, his saturations drop from  94% to 87%.  VITAL SIGNS:  Blood pressure is 106/50, heart rate is 54, weight is 176.  HEENT:  Normal.  NECK:  Supple.  No JVD.  Carotids are 2+ bilaterally without any bruits.  There is no lymphadenopathy or thyromegaly.  CARDIAC:  He has distant heart sounds.  He is regular with no obvious  murmur.  LUNGS:  Decreased air movement with a prolonged expiratory phase.  ABDOMEN:  Soft, nontender, nondistended.  There is no  hepatosplenomegaly.  There are no bruits, no masses.  Good bowel sounds.  EXTREMITIES:  Warm with no cyanosis, clubbing, or edema.  No rash.  NEUROLOGIC:  He is alert and oriented x3.  Cranial nerves II-XII are  intact except for a mild difficulty hearing.  His affect is very  pleasant.   ASSESSMENT/PLAN:  1. Coronary artery disease, status post previous myocardial  infarction.  He is stable with no ischemia on his stress test.  2. Dyspnea.  In reviewing his PFTs from April 2007, it appears he has      significant chronic obstructive pulmonary disease with somewhat of      a reactive airway.  We have given him an albuterol rescue inhaler.      Start him on home oxygen for his desaturations.  We will have him      go back to see Dr. Shelle Iron.  3. Obstructive sleep apnea.  As per Dr. Shelle Iron, he is getting a CPAP      machine.  4. Hyperlipidemia followed by Dr. Hetty Ely.  Given his coronary artery      disease and diabetes, I would recommend titrating his Statin to      keep his LDL under 70.  5. Hypertension.  Well controlled.     [name], MD  Electronically Signed    DRB/MedQ  DD: 10/12/2006  DT: 10/12/2006  Job #: [id number]

## 2010-08-27 NOTE — Procedures (Signed)
[name], [name] NO.:  [id number]   MEDICAL RECORD NO.:  [id number]          PATIENT TYPE:  OUT   LOCATION:  SLEEP CENTER                 FACILITY:  [facility]   PHYSICIAN:  Barbaraann Share, MD,FCCPDATE OF BIRTH:  [birth year]1926   DATE OF STUDY:  09/09/2006                            NOCTURNAL POLYSOMNOGRAM   REFERRING PHYSICIAN:  Barbaraann Share, MD,FCCP   INDICATION FOR STUDY:  Hypersomnia with sleep apnea.   EPWORTH SLEEPINESS SCORE:  13.   SLEEP ARCHITECTURE:  The patient had a total sleep time of 307 minutes  with significantly decreased REM and never achieved slow wave sleep.  Sleep onset latency was normal and REM onset was prolonged at 219  minutes.  Sleep efficiency was decreased at 79%.   RESPIRATORY DATA:  The patient was found to have 13 hypopnea's and 251  apnea's. This gave him an apnea/hypopnea index of 52 events per hour.  The events occurred primarily in the supine position, there was moderate  to loud snoring noted throughout.   OXYGEN DATA:  The patient had O2 desaturation as low as 81% with his  obstructive events.   CARDIAC DATA:  Occasional PACs were noted but no clinically significant  arrhythmias.   MOVEMENT-PARASOMNIA:  The patient was found to have 182 leg jerks with  less than one per hour resulting in arousal or awakening.   IMPRESSIONS-RECOMMENDATIONS:  1. Severe obstructive sleep apnea/hypopnea syndrome with an      apnea/hypopnea index of 52 events per hour and O2 desaturation as      low as 81%.  These events occur primarily in the supine position.      Treatment for this degree of sleep apnea should focus primarily on      weight loss if applicable as well as CPAP.  2. Occasional PAC without clinically significant arrhythmia.  3. Large numbers of leg jerks with little sleep disruption. These are      more than likely related to the patient's sleep disorder breathing.      Barbaraann Share, MD,FCCP  Diplomate, [employer]  Board of [employer]  Medicine  Electronically Signed     KMC/MEDQ  D:  09/23/2006 12:43:26  T:  09/23/2006 20:03:46  Job:  [id number]

## 2010-08-27 NOTE — Discharge Summary (Signed)
[name], [name]                 ACCOUNT NO.:  [id number]   MEDICAL RECORD NO.:  [id number]          PATIENT TYPE:  INP   LOCATION:  3741                         FACILITY:  [facility]   PHYSICIAN:  Rosalyn Gess. Norins, MD  DATE OF BIRTH:  11-16-1924   DATE OF ADMISSION:  10/28/2008  DATE OF DISCHARGE:  10/30/2008                               DISCHARGE SUMMARY   ADMITTING DIAGNOSES:  1. Chest pain, rule out myocardial infarction.  2. Chronic obstructive pulmonary disease.  3. Diabetes.  4. Hyperlipidemia.  5. Hypertension.  6. Sleep apnea.  7. Dementia.   DISCHARGE DIAGNOSES:  1. Myocardial infarction, ruled out.  2. Pulmonary, the patient with new hilar mass.  3. Diabetes.  4. Hyperlipidemia.  5. Hypertension.  6. Sleep apnea.  7. Dementia.   CONSULTANTS:  Barbaraann Share, MD, [facility] for Pulmonary.   PROCEDURES:  1. Chest x-ray of date of admission, which showed stable chronic      obstructive pulmonary disease with no active disease.  2. Diagnostic x-ray, right shoulder, which showed no acute fracture or      dislocation with degenerative changes noted.  3. CT angio performed on October 28, 2008, which showed that the patient      to have no pulmonary embolism.  He had 3 x 3 x 2.2-cm right hilar      mass with associated medial segment right upper lobe atelectasis.      A 1.1-cm pretracheal lymph node.  Opacification and atelectasis of      the right upper lobe was noted.  There was mass effect upon the      right-sided central bronchi and the main bronchi demonstrate      anterior-posterior collapse, which may be dynamic.   HISTORY OF PRESENT ILLNESS:  The patient is an 77 year old gentleman  followed by Dr. Laurita Quint with the medical history is significant  for COPD and coronary artery disease.  He also suffers from significant  dementia.  The patient had a significant cardiac history with a non drug-  eluting stent placed in 2005.  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  This event also  resulted an inferior and posterior myocardial infarction.   The patient has chronic shortness of breath, placed on 2 liters of  oxygen secondary to his COPD.  The patient also had significant  dementia.   For the several days prior to admission, the patient was having  intermittent right-sided chest pain.  He was occasionally grabbing his  right chest and saying it hurt.  He was unable to really quantify or  qualify this discomfort.  He had been having increasing pain on the  morning of admission.  He had been seen the day prior at primary care  office where EKG was done and cardiac markers were ordered and he was  sent home to return if he had persisting chest pain.  Because of  persistent pain, he returned to the Emergency Department.   Please see the H and P for past medical history, family  history, and  social history.   HOSPITAL COURSE:  1. Cardiovascular:  The patient was admitted to a telemetry unit and      cardiac enzymes were ordered.  Point-of-care markers with a CK-MB      of 1.4 with a troponin of less than 0.05, troponin was 0.03,      creatinine of 81 with a troponin of 0.03, CK of 84 with a troponin      of 0.03.  The patient did have a positive D-dimer at 1.26.  The      patient had normal telemetry monitoring, but the patient having      cardiac enzymes that were negative x3 with EKG that was normal and      telemetry that was normal.  He was ruled out for any cardiac event.  2. Pulmonary:  The patient has significant chronic obstructive      pulmonary disease, oxygen dependent.  The patient did have a D-      dimer that was elevated leading to the CT angio with results as      noted above.  The patient was seen in consultation by Dr. Marcelyn Bruins for Pulmonary.  He felt that the patient would be best      served by having an outpatient PET-CT for staging and then       following this study to determine what would be the best approach      for tissue diagnosis.  Concern is that the patient may have a      bronchogenic CA.  He does have evidence of airway collapse, but      this may be either extrinsic compression or it may be dynamic.      This has been discussed with the patient's family.  They are      comfortable with him being discharged with outpatient followup.  3. Diabetes.  The patient has been on sliding scale and done well and      then stable.  4. Coronary artery disease.  The patient with no evidence of      decompensation and additional cardiac symptoms as noted.  With the      patient's evaluation having been complete with plan in place for      outpatient evaluation and followup, the patient is now stable and      ready for discharge home.   DISCHARGE EXAMINATION:  VITAL SIGNS:  Temperature 98.4, blood pressure  99/53, heart rate 63, respirations 18, O2 sats 93% on 2 liters.  CBG was  mildly elevated at 174.  GENERAL APPEARANCE:  This is a pleasant elderly gentleman sitting in a  Palomas chair who is in no acute distress.  He is oriented to place.  HEENT:  Unremarkable with no temporal wasting.  CHEST:  The patient has decreased breath sounds at the right with no  wheezing, no rhonchi.  He has no increased work of breathing.  CARDIOVASCULAR:  2+ radial pulse.  He had a regular rate and rhythm  without murmur heard at my exam.  ABDOMEN:  Nontender.   FINAL LABORATORY:  Hemoglobin A1c was 6.5% folate was normal at greater  than 20.  TSH was normal at 2.8.  Final CBC on October 29, 2008 with a  white count of 9100, hemoglobin of 13.3 g, platelet count 159,000.  Final INR from October 29, 2008 was 1.1.  Final chemistries on October 29, 2008 with a sodium  of 135, potassium of 4.4, chloride of 99, CO2 of 28,  BUN of 17, creatinine of 1.13, glucose was 102.  Lipid profile from October 29, 2008 with a cholesterol of 136, triglycerides of 70, HDL was 41,  and  LDL was 81.   DISCHARGE MEDICATIONS:  The patient will resume all of his home  medications which is confirmed by reference to the EMR.  We will add  Avelox 400 mg daily for an additional 5 days of treatment for potential  postobstructive respiratory infection; however, the patient is afebrile  with no productive sputum at this time.   DISPOSITION:  The patient is to be discharged home.  The Pulmonary  office will contact his family in regards to the scheduling of  outpatient PET-CT scan and followup appointment with Dr. Marcelyn Bruins.   The patient's condition at time of discharge, the patient is guarded.  Based upon his severe COPD, a new diagnosis of potential pulmonary  neoplasm.      Rosalyn Gess Norins, MD  Electronically Signed     MEN/MEDQ  D:  10/30/2008  T:  10/30/2008  Job:  [id number]   cc:   Arta Silence, MD  Barbaraann Share, MD,FCCP

## 2010-08-27 NOTE — Assessment & Plan Note (Signed)
[facility]                             PULMONARY OFFICE NOTE   [name], [name]                        MRN:          [id number]  DATE:09/09/2006                            DOB:          [birth year]    REFERRING PHYSICIAN:  [name], MD   SLEEP MEDICINE CONSULTATION:   HISTORY OF PRESENT ILLNESS:  The patient is a 77 year old gentleman whom  I have been asked to see for possible sleep apnea.  The patient has been  noted to have loud snoring, according to his family, but no one has  noted pauses in his breathing during sleep.  The patient typically gets  to bed between 11:00 and 12:00 and gets up at 9 a.m. to start his day.  He feels rested most days except on occasions.  The patient is currently  retired and really does not have anything pressing on his schedule.  He  denies any alertness issues with quiet times, but the family states that  they see him dozing quite a bit.  The patient denies any decrease in  quality of life because of sleepiness or fatigue.  He denies any  sleepiness with driving.  Of note, his weight is fairly neutral.   PAST MEDICAL HISTORY:  1. Hypertension.  2. History of coronary disease, with percutaneous intervention in      2005.  3. History of diabetes.  4. History of dyslipidemia.  5. History of allergic rhinitis.  6. History of multiple orthopedic procedures.   CURRENT MEDICATIONS:  1. Altace 5 mg daily.  2. Aspirin 81 mg daily.  3. Plavix 75 mg daily.  4. Toprol-XL 25 daily.  5. Amaryl 4 mg daily.  6. Spiriva 1 puff daily.  7. Simvastatin 80 mg daily.   The patient has an allergy to PENICILLIN.   SOCIAL HISTORY:  He is married and has children.  He has a history of  smoking 1/2 pack per day for 20 years.  He has not smoked since 1968.   FAMILY HISTORY:  Remarkable for a sister having laryngeal cancer.  Otherwise noncontributory.   REVIEW OF SYSTEMS:  As per history of present illness.  Also  see patient  intake form documented in the chart.   PHYSICAL EXAMINATION:  GENERAL:  He is a well-developed male in no acute  distress.  VITAL SIGNS:  Blood pressure is 126/64, pulse 82, temperature 97.9,  weight 177 pounds.  O2 saturation on room air is 89%-90%.  HEENT:  Pupils equal, round, and reactive to light and accommodation.  Extraocular muscles were intact.  Nares show mild septal deviation to  the left.  Oropharynx shows elongation of the soft palate and uvula.  NECK:  Supple, without JVD or lymphadenopathy.  There is no palpable  thyromegaly.  CHEST:  Reveals faint basilar crackles.  CARDIAC:  Reveals regular rate and rhythm.  ABDOMEN:  Soft, nontender, with good bowel sounds.  GENITAL/RECTAL/BREASTS:  Not done and not indicated.  LOWER EXTREMITIES:  Show trace edema.  Pulses are intact distally.  NEUROLOGIC:  Alert and oriented, with no  obvious motor deficits.   IMPRESSION:  Probable obstructive sleep apnea.  The family is very  concerned about his loud snoring and sleepiness during the day.  The  patient is really denying any significant impact on his quality of life  and feels there is nothing wrong.  I have explained to him that his  history is suspicious for sleep apnea, and with his underlying comorbid  disease, presents a significant cardiovascular risk.  The patient is  agreeable to having nocturnal polysomnography.   PLAN:  Schedule for nocturnal polysomnogram.  The patient will follow up  after the above.     Barbaraann Share, MD,FCCP  Electronically Signed    [facility]/MedQ  DD: 10/07/2006  DT: 10/08/2006  Job #: [id number]   cc:   [name], MD  Arta Silence, MD

## 2010-08-27 NOTE — Assessment & Plan Note (Signed)
[facility]                             PULMONARY OFFICE NOTE   NAME:JOBEKaegan, Hettich                        MRN:          [id number]  DATE:11/04/2006                            DOB:          [birth year]    HISTORY OF PRESENT ILLNESS:  Mr. Lemoine is a very pleasant 77 year old  white male who I have been asked to see for shortness of breath, as well  as chronic hypoxemia. I have seen the patient for sleep apnea in the  past for which CPAP has been prescribed and overall, he is doing fairly  well with this. The patient has been noted to have O2 saturations  anywhere from 89% to 91% at rest and with exertion, and he did have  pulmonary function studies done in April 2007 which showed moderate to  severe emphysema with a FEV1 percent of 52 and a significant response to  bronchodilators. The patient did have some mild restriction on his lung  volumes, however he had a little bit of an air leak which may make these  inaccurate. DLCO was surprisingly normal. The patient was placed on  Spiriva by Dr. Gala Romney at that time with albuterol inhaler p.r.n. The  patient states that he has been short of breath for years, but the last  one year it has been significantly worse. It has gotten to the point  where he has less than one half block dyspnea on exertion at a moderate  pace on flat ground. He will get winded bringing one bag of groceries in  from the car. He does have significant cough through the day that is  sometimes productive of watery mucus. He also complains of significant  post-nasal drip and nasal congestion year round. The patient has a long  history of smoking one pack per day for 25 years, but has not done so in  39 years. He denies ever having asthma as a child or during adulthood.  He has never described any symptoms consistent with asthma. The patient,  I did not feel initially, that Spiriva helped him much, however he  noticed a significant change  when he discontinued it and therefore  restarted.   PAST MEDICAL HISTORY:  1. Hypertension.  2. History of myocardial infarction with percutaneous intervention in      2006.  3. History of emphysema as stated above.  4. History of dyslipidemia.  5. History of sleep apnea which is currently in the process of being      treated.   CURRENT MEDICATIONS:  1. Altace 5 mg daily.  2. Aspirin 81 mg daily.  3. Plavix 75 mg daily.  4. Toprol XL 25 mg daily.  5. Amaryl 4 mg daily.  6. Spiriva 1 cap daily.  7. Simvastatin 80 mg daily.  8. Albuterol inhaler p.r.n.   ALLERGIES:  PENICILLIN.   SOCIAL HISTORY:  He is married and has children. He is retired from a  Veterinary surgeon. He lives with his wife. Smoking history is per the history  of present illness.   FAMILY HISTORY:  Remarkable for his  sister having throat cancer, also a  history of heart disease.   REVIEW OF SYSTEMS:  As per history of present illness, also see patient  intake form documented on the chart.   PHYSICAL EXAMINATION:  GENERAL:  He is a well developed male in no acute  distress.  VITAL SIGNS:  Blood pressure 108/58, pulse 69, temperature 97.6, weight  178 pounds, O2 saturation on room air is 91%.  HEENT:  Pupils equal, round, and reactive to light and accommodation,  extraocular muscles are intact. Naris showed mild septal deviation to  the left. Oropharynx does show elongation of the soft palette and uvula.  NECK:  Supple without JVD or lymphadenopathy. There is no palpable  thyromegaly.  CHEST:  Reveals diffuse rhonchi with a few basilar crackles.  CARDIAC:  Regular rate and rhythm with no murmurs, rubs, or gallops.  ABDOMEN:  Soft and nontender with good bowel sounds.  GENITAL:  Not done and not indicated.  RECTAL:  Not done and not indicated.  BREASTS:  Not done and not indicated.  LOWER EXTREMITIES:  Without significant edema, pulses are intact  distally.  NEUROLOGIC:  Alert and oriented with no obvious  motor deficits.   IMPRESSION:  Moderate to severe obstructive lung disease by pulmonary  function studies that I suspect is primarily emphysema, however given  the very strong improvement with inhaled beta 2 agonists, I would wonder  if he had an asthma component as well. I really think that at this point  in time that we should intensify his bronchodilator regimen for both  emphysema, but also to cover him for the possibility of a component of  asthma. I would recommend adding a long acting beta agonist, as well as  an inhaled cortical steroid to his current regimen. The patient is also  having a lot of nasal congestion and post nasal drip which may be  causing his cough and perhaps nasal steroids would help there as well.  This will also be important for wearing his CPAP, that is sometimes  difficult because of nasal congestion.   PLAN:  1. We will add Symbicort 80/4.5 2 puffs b.i.d. to his Spiriva via an      Aerochamber spacer.  2. Check chest x-ray today.  3. Nasonex 2 sprays in each naris daily.  4. I have asked the patient to consider turning the heat up on his      humidifier that may also contribute to dryness and nasal      congestion.  5. Now that the patient has gotten used to the CPAP machine, we will      go ahead and optimize      pressure for him with an auto device for the next two weeks with      download.  6. The patient will follow up in six weeks or sooner if there are      problems.     Barbaraann Share, MD,FCCP  Electronically Signed    [facility]/MedQ  DD: 11/05/2006  DT: 11/06/2006  Job #: [id number]   cc:   [name], MD

## 2010-08-27 NOTE — Assessment & Plan Note (Signed)
[facility] OFFICE NOTE   BARNIE, SOPKO                        MRN:          [id number]  DATE:07/26/2008                            DOB:          1924/11/26    PRIMARY CARE PHYSICIAN:  Arta Silence, MD   PULMONOLOGIST:  Barbaraann Share, MD, [facility]   HISTORY:  Mr. Wieck is a delightful 77 year old male with a history of  coronary artery disease status post stenting of the third marginal with  a nondrug-eluting stent in September 2005.  This was complicated by  stent thrombosis after discontinuation of his Plavix resulting in acute  inferior and posterior myocardial infarction followed by repeat  stenting.   The remainder of his medical history is notable for diabetes,  hypertension, hyperlipidemia, O2 dependent COPD, bradycardia requiring  discontinuation of his beta-blocker, bilateral hip replacement, and  obstructive sleep apnea with noncompliance to CPAP.   He returns today with his daughter for routine followup.  He is doing  great.  He is now wearing his oxygen 24 hours a day and feels much  better.  He does get short of breath if he really pushes it too hard,  but otherwise, he has been doing great.  He denies any chest pain,  orthopnea, and no PND.  No lower extremity edema.  He does continue to  have some problems with his memory.  Otherwise, remainder of review of  systems, all systems are negative except for the HPI and problem list.   CURRENT MEDICATIONS:  1. Aspirin 81.  2. Plavix 75.  3. Amaryl 4 mg a day.  4. Spiriva.  5. Simvastatin 80 a day.  6. Zyrtec 10 a day.  7. Lisinopril 5 a day.  8. Symbicort 160/4.5.  9. Lasix 20 a day.  10.Potassium 28.   PHYSICAL EXAMINATION:  GENERAL:  He is an elderly male in no acute  distress, ambulates around the clinic slowly with his oxygen with no  respiratory difficulty.  VITAL SIGNS:  Blood pressure 118/68, heart rate 56, weight is 181.  HEENT:  Normal.  NECK:  Supple.  No JVD, carotids 2+ bilaterally without bruits.  There  is no lymphadenopathy or thyromegaly.  CARDIAC:  He is barrel-chested.  Distant heart sounds.  PMI is not  palpable.  He is bradycardic and regular.  No obvious murmurs.  LUNGS:  Decreased air movement throughout with prolonged expiratory  phase.  No wheezing.  ABDOMEN:  Soft, nontender, nondistended.  No hepatosplenomegaly.  No  bruits.  No masses.  Good bowel sounds.  There is a ventral hernia.  EXTREMITIES:  Warm with no cyanosis, clubbing, or edema.  No rash.  NEUROLOGIC:  Alert and oriented x3.  Cranial nerves II through XII are  intact.  Moves all 4 extremities without difficulty.  Affect is  pleasant.   EKG shows sinus bradycardia with PACs, mild T-wave flattening, rate of  56.   ASSESSMENT AND PLAN:  1. Coronary artery disease, this is stable.  No evidence of ischemia.      Continue lifelong Plavix.  2. Chronic obstructive pulmonary.  He is doing great on his home      oxygen.  I congratulated him.  3. Hyperlipidemia.  Goal LDL is less than 70, this was followed by Dr.      Hetty Ely.  He is on high-dose simvastatin and recently had been      warning about this with increased risk for myalgias and myositis.      He has not had any problems with this.  So, we will continue him on      current therapy.  If he does have a problem, consider switching      over to Crestor or Lipitor.  4. Bradycardia, this is stable.  We will continue to follow up here.   DISPOSITION:  I will see him back for routine followup in 9 months.     [name], MD  Electronically Signed    DRB/MedQ  DD: 07/26/2008  DT: 07/27/2008  Job #: [id number]

## 2010-08-27 NOTE — Assessment & Plan Note (Signed)
[facility] OFFICE NOTE   [name], [name]                        MRN:          [id number]  DATE:01/18/2008                            DOB:          [birth year]    PRIMARY CARE PHYSICIAN:  Arta Silence, MD   PULMONOLOGIST:  Barbaraann Share, MD, [facility]   INTERVAL HISTORY:  Mr. Buchanon is a 77 year old male with history of  coronary artery disease status post PTCA and stenting of the third  marginal with a non-drug-eluting stent in September 2005.  This was  complicated by stent thrombosis, after discontinuation of his Plavix  resulting in acute inferior posterior myocardial infarction, history  with repeat stenting.   The remainder of his medical history is notable for diabetes,  hypertension, hyperlipidemia, O2 dependent COPD, bradycardia requiring  discontinuation of his beta-blocker, bilateral hip replacements, and  obstructive sleep apnea.   He returns today for routine followup.  Overall, he says he is doing  fairly well.  He says he does get short of breath when he goes too  quickly, but he just takes his time.  He has not had any chest pain or  orthopnea.  No PND.  He continues to have some problems with his memory.  He has been following with Dr. Shelle Iron for his lung problems.  Unfortunately, he has not been wearing his oxygen regularly as he says  it is more of problem to carry it.   CURRENT MEDICATIONS:  1. Aspirin 81.  2. Plavix 75.  3. Amaryl 4.  4. Spiriva 18.  5. Simvastatin 80.  6. Zyrtec 10.  7. Lisinopril 5 a day.  8. Symbicort b.i.d.   PHYSICAL EXAMINATION:  GENERAL:  He is an elderly male in no acute  distress.  He ambulates around the clinic slowly without any respiratory  difficulty.  VITAL SIGNS:  Blood pressure is 104/60, heart rate is 52.  HEENT:  Normal.  NECK:  Supple.  There is no JVD.  Carotids are 2+ bilaterally without  any bruits.  There is no lymphadenopathy or  thyromegaly.  CARDIAC:  He is bradycardic with an S4.  No murmur.  PMI is  nondisplaced.  LUNGS:  Clear with decreased air movement throughout.  ABDOMEN:  Soft, nontender, nondistended.  No hepatosplenomegaly.  No  bruits.  No masses.  Good bowel sounds.  EXTREMITIES:  Warm with no cyanosis, clubbing, or edema.  He does have a  spot on his left shin where he bumped himself and took a wall at the  heel.  NEURO:  Alert and oriented x3.  Cranial nerves II-XII are intact.  Moves  all 4 extremities without difficulty.  Affect is flattened   EKG shows sinus bradycardia with occasional PVCs at a rate of 52,  nonspecific T-wave flattening laterally.   ASSESSMENT AND PLAN:  1. Coronary artery disease is stable.  Recommend lifelong Plavix.      Unfortunately, he is not a candidate for beta-blocker due to      bradycardia.  2. Chronic obstructive pulmonary disease.  I had  a long talk with him      about the need to wear oxygen when he exerts himself to prevent      desaturations and worsening of his pulmonary status.  3. Hyperlipidemia followed by his primary care.  4. Bradycardia.  This is stable.  He may need a pacemaker at some      point.     [name], MD  Electronically Signed    DRB/MedQ  DD: 01/18/2008  DT: 01/19/2008  Job #: [id number]   cc:   Arta Silence, MD  Barbaraann Share, MD,FCCP

## 2010-08-27 NOTE — Assessment & Plan Note (Signed)
[facility]                             PULMONARY OFFICE NOTE   NAME:JOBECurrie, [name]                        MRN:          [id number]  DATE:10/07/2006                            DOB:          [birth year]    SUBJECTIVE:  [name] is in for a followup of his sleep study.  He was  found to have severe obstructive sleep apnea with a respiratory  disturbance, an index of 52 events per hour and O2 desaturation as low  as 81%.  I have gone over the study at great length with him and  answered all of his questions.   PHYSICAL EXAM:  GENERAL:  He is a well-developed male in no acute  distress.  Blood pressure 130/62, pulse 46, temperature 97.6, weight 176 pounds,  saturation on room air is 92%.   IMPRESSION:  Severe obstructive sleep apnea/hypopnea syndrome with an  apnea/hypopnea index of 51 events per hour and O2 desaturation noted.  I  had a long discussion with him about sleep apnea and its effects on his  cardiovascular health.  The patient denies being symptomatic during the  day, but the family feels that he is significantly sleepy with periods  of inactivity.  At this point in time, I really think that we should  give him a trial of CPAP, and he is at least agreeable to trying this.   PLAN:  Initiate CPAP at 10 cm with followup in 4 weeks.  He will also  need pressure optimization after that time.     Barbaraann Share, MD,FCCP  Electronically Signed    [facility]/MedQ  DD: 10/23/2006  DT: 10/23/2006  Job #: [id number]

## 2010-08-27 NOTE — Assessment & Plan Note (Signed)
[facility]                            CARDIOLOGY OFFICE NOTE   [name], [name]                        MRN:          [id number]  DATE:01/12/2007                            DOB:          11-13-1924    PRIMARY CARE PHYSICIAN:  [name].   PULMONOLOGIST:  [name].   INTERVAL HISTORY:  [name] is a delightful 77 year old male with a  history of coronary artery disease status post PTA and stenting of his  third marginal with a non-drug eluting in September 2005.  This was  complicated by in stent thrombosis after discontinuation of his Plavix  resulting in an acute inferior posterior myocardial infarction.  He was  treated with repeat stenting, ejection fraction is approximately 50%.  Also has a history of diabetes, hypertension, hyperlipidemia and  COPD/asthma with p.r.n. oxygen.  He returns today for routine followup.  Overall he is doing well.  He denies any chest pain.  He does have some  chronic shortness of breath but this is unchanged.  He was recently  started on some Zyrtec for some postnasal drip and he feels this has  helped him somewhat.  Denies any orthopnea, PND, no lower extremity  edema.  Has been compliant with his medications.  Just very an  occasional wheeze.   MEDICATIONS:  1. Aspirin 81.  2. Plavix 75.  3. Toprol XL 25.  4. Amaryl 4.  5. Spiriva.  6. Simvastatin 80.  7. Zyrtec 10 mg a day.  8. Lisinopril 5 a day.  9. Nasonex 100 mg a day.  10.Symbicort 160/4.5 inhalers.   PHYSICAL EXAMINATION:  He is well-appearing, no acute distress.  He  ambulates around the clinic without any respiratory difficulty.  Blood  pressure is 123/78, heart rate is 63, weight is 177.  HEENT:  Normal.  NECK:  Supple, no JVD.  Carotids are 2+ bilaterally without any obvious  bruits.  There is no lymphadenopathy or thyromegaly.  CARDIAC:  PMI is non palpable.  He has very distant heart sounds.  He is  regular with no  obvious murmurs, rubs or gallops.  LUNGS:  Diminished air movement throughout, otherwise clear, no  wheezing.  He is somewhat barrel chested.  ABDOMEN:  Soft, nontender, nondistended, there is no hepatosplenomegaly,  no bruits, no masses appreciated.  EXTREMITIES:  Warm with no cyanosis, clubbing or edema.  No rash.  NEURO:  Alert and oriented x3, cranial nerves II-XII are intact, moves  all 4 extremities without difficulty, affect is pleasant.   EKG shows sinus rhythm with sinus arrhythmia.  There is a rate of 63,  there is some nonspecific T wave flattening and low voltage in the limb  leads.  This is unchanged from previous.   ASSESSMENT/PLAN:  1. Coronary artery disease, status post previous myocardial      infarction, is stable.  Continue current therapy.  Should he      develop more wheezing we can stop his beta blocker.  Otherwise we      will continue.  2. Hyperlipidemia, is followed by Dr.  Schaller.  Goal LDL is less than      70.  3. Chronic obstructive pulmonary disease/asthma, followed by Dr.      Shelle [name], appears stable.  4. Obstructive sleep apnea, also followed by [name].   DISPOSITION:  Overall he is doing quite well from a cardiac point of  view.  I encouraged him to be as active as possible, we will check an  echocardiogram just to reassess his EF and also make sure he does not  have a pericardial effusion given his low voltage.     Dennis Buckles. Bensimhon, MD  Electronically Signed    DRB/MedQ  DD: 01/12/2007  DT: 01/12/2007  Job #: [id number]   cc:   Dennis Silence, MD

## 2010-08-30 NOTE — Consult Note (Signed)
[name], [name]                 ACCOUNT NO.:  [id number]   MEDICAL RECORD NO.:  [id number]          PATIENT TYPE:  INP   LOCATION:  2550                         FACILITY:  [facility]   PHYSICIAN:  Maisie Fus C. Wall, M.D.   DATE OF BIRTH:  Oct 31, 1924   DATE OF CONSULTATION:  05/20/2004  DATE OF DISCHARGE:                                   CONSULTATION   We were asked by Dr. Thurston Hole to evaluate [name], a 77 year old  gentleman who experienced hypotension in his surgical procedure today,  systolic in the 80s.   [name] is well known to our group.  He is a patient of Dr. Nicholes Mango.   He underwent left hip arthroplasty today.   Preoperative clearance on April 06, 2004, showed the cath with a 50% LAD,  mid LAD, 80% diagonal-2, 99% OM-3, at which time 60% ostial right coronary  artery and multiple 50-60% lesions followed by a 70% distal and 95% lesion.  He had a non drug eluting stent placed in the circumflex OM-3.  He was kept  on Plavix for a month and then cleared for surgery.   Today in the operating room he had some hypotension.  He said he had this  before when he had his gallstones removed.   He is currently in the PACU and having no symptoms.  His vital signs are  stable.   PAST MEDICAL HISTORY:  He is INTOLERANT OF BIAXIN, PENICILLIN AND ADHESIVE  TAPE.  Other medical issues include new onset diabetes, hypertension,  hyperlipidemia.   MEDS PRIOR TO ADMISSION:  1.  Altace 5 mg daily.  2.  Aspirin 325 mg daily.  3.  Amaryl 2 mg daily.  4.  Plavix 75 mg daily which he has completed.  5.  Toprol XL 25 mg daily.  6.  Lipitor 5 mg daily.  7.  Nitroglycerin p.r.n.   SOCIAL HISTORY:  He is married and lives in [place].  He has 2 children.  He does not drink.  He quit tobacco in 1968 with a 10 pack year smoking  history.  He is retired Scientist, product/process development.   FAMILY HISTORY:  He has a half brother who had an MI in his 54s.   REVIEW OF SYSTEMS:  Is negative  other than HPI.   EXAM:  He is currently awake and alert, in no acute distress.  Blood  pressure is 126/53, respiratory rate is 18, he is afebrile, pulse is 70 and  regular.  HEENT:  PERRLA, extraocular movements intact.  Conjunctiva clear.  Carotid  upstrokes were equal bilaterally without bruits, no JVD, thyroid was not  enlarged.  LUNGS:  Clear.  HEART:  Reveals a regular rate and rhythm with distant heart sounds.  ABDOMINAL EXAM:  Soft with good bowel sounds.  There is no tenderness.  EXTREMITIES:  Reveal pulses to be weak but present.  The left foot is cooler  than the right.  There is no edema.   Chest x-ray from April 09, 2005, shows emphysema.   EKG:  None in the chart.  It has been ordered.   LABORATORY DATA:  Remarkable for a hemoglobin A1c of 5.7, TSH of 1.81,  creatinine 1.0, glucose 59, coags were normal.   ASSESSMENT AND RECOMMENDATION:  Systolic hypotension in the operating room,  now resolved.  This has happened apparently before for the patient when he  had a cholecystectomy.  He could have had some ischemia considering the  diffuse right coronary artery disease that he has and previous inferolateral  ischemia on his Cardiolite.   RECOMMENDATION:  1.  Check EKG now and in a.m.  2.  Serial cardiac enzymes.  3.  If enzymes are negative, continue medical therapy.  Restart aspirin when      okay with Dr. Thurston Hole.  4.  Continue other medications.      TCW/MEDQ  D:  05/20/2004  T:  05/20/2004  Job:  [id number]   cc:   Molly Maduro A. Thurston Hole, M.D.  [address]  [place] [address]  Fax: [phone]   Laurita Quint, M.D.  [address] [place]  [place] [address]  Fax: [phone]   Arvilla Meres, M.D.  [address] [place]  [address]  [place], [place] [address]

## 2010-08-30 NOTE — H&P (Signed)
[name], [name] NO.:  [id number]   MEDICAL RECORD NO.:  [id number]          PATIENT TYPE:  IPS   LOCATION:  4032                         FACILITY:  [facility]   PHYSICIAN:  Erick Colace, M.D.DATE OF BIRTH:  1924-05-19   DATE OF ADMISSION:  05/30/2005  DATE OF DISCHARGE:                                HISTORY & PHYSICAL   CHIEF COMPLAINT:  Right hip pain, decreased ability to ambulate.   HISTORY OF PRESENT ILLNESS:  This 77 year old male with a history of left  THR on February of 2006, was admitted May 26, 2005 for right hip pain  secondary to end-stage degenerative joint disease with no relief using  conservative care. He underwent right THR on May 26, 2005 per Dr. Hadassah Pais. Placed on Coumadin for DVT prophylaxis and bed weight bearing as  tolerated. Postoperatively his hemoglobin dropped to 7.7, he was transfused  2 units of packed red blood cells and his last hemoglobin was up to 9.4.  Foley catheter was removed May 29, 2005, he is voiding. He is followed  by [facility]. He had postoperative hypokalemia and received  additional potassium chloride supplementation.   REVIEW OF SYSTEMS:  Negative for chest pain, negative for shortness of  breath except for some with therapy. He continues to wear his oxygen but  does not wear this at home. His hip pain is controlled with medications.   PAST MEDICAL HISTORY:  1.  History of coronary artery disease status post PTCA in 2005.  2.  Left ventricular dysfunction.  3.  History of hypertension.  4.  Non-insulin-dependent diabetes mellitus.  5.  Hyperlipidemia.   PAST SURGICAL HISTORY:  Positive for left THR in February, 2006.  Cholecystectomy, and hernia repair.   FAMILY HISTORY:  Positive for CAD and diabetes.   SOCIAL HISTORY:  Married and lives in [place]. He is retired. He lives  in a one-level home with 3-4 steps to enter.   FUNCTIONAL STATUS:  Needs assistance with  activities of daily living and  mobility.   CURRENT MEDICATIONS:  1.  Altace 5 mg p.o. daily.  2.  Amaryl 2 mg p.o. daily.  3.  Enteric coated aspirin 81 mg p.o. daily.  4.  Plavix 75 mg p.o. daily.  5.  Lipitor 40 mg p.o. daily.   ALLERGIES:  PENICILLIN, BIAXIN, ADHESIVE.   PHYSICAL EXAMINATION:  VITAL SIGNS:  Blood pressure is 112/65, pulse 83,  respirations 18, temperature 97.8.  GENERAL:  An elderly male in no acute distress.  HEENT:  Eyes anicteric, not injected. External ENT normal.  NECK:  Supple without adenopathy.  RESPIRATORY:  Good, lungs are clear.  HEART:  Regular rate and rhythm, no rubs, murmurs or other sounds.  ABDOMEN:  Positive bowel sounds, soft, nontender to palpation.  EXTREMITIES:  No clubbing, cyanosis or edema.  NEURO:  He is alert and oriented x3. Mood, memory, affect are all intact.  Motor strength is 5/5 bilateral deltoid, biceps, triceps, finger flexors, is  1/5 in the hip flexor, quad and 4- in the TA and gastroc on the right.  On  the left side he has 5/5 throughout.   IMPRESSION:  1.  Decreased self-care mobility skills due to right hip DAG status post THR      postoperative day number 4.  2.  Acute blood loss anemia. Hemoglobin 9.4, will continue to monitor.  3.  Coronary artery disease - monitor for signs of decompensation during      more intensive rehabilitation.  4.  Non-insulin-dependent diabetes mellitus. Check CBCs a.c. and h.s. per      rehab nursing.  5.  Hyperlipidemia - continue Lipitor.  6.  Hypertension - continue Toprol.  7.  Hip pain, will discontinue PCA, monitor on Vicodin and Robaxin. Rehab      nursing to assist.   He will benefit from comprehensive intensive inpatient rehabilitation in a  more medically supervised environment. PT will work on transfers, balance,  sitting and standing tolerance as well as ambulation.  OT will work on activities of daily living, transfers and working with  assistive devices.      Erick Colace, M.D.  Electronically Signed     AEK/MEDQ  D:  05/30/2005  T:  05/30/2005  Job:  [id number]   cc:   Molly Maduro A. Thurston Hole, M.D.  Fax: [phone]   Willa Rough, M.D.  [address]  [address]  [place]  [place] [address]   Laurita Quint, M.D.  Fax: [phone]

## 2010-08-30 NOTE — Cardiovascular Report (Signed)
NAMEDRAVEN, NATTER NO.:  [id number]   MEDICAL RECORD NO.:  [id number]          PATIENT TYPE:  INP   LOCATION:  2921                         FACILITY:  [facility]   PHYSICIAN:  Charlies Constable, M.D. [facility] DATE OF BIRTH:  1924/07/24   DATE OF PROCEDURE:  06/03/2004  DATE OF DISCHARGE:                              CARDIAC CATHETERIZATION   MEDICAL HISTORY:  Mr. Avey is 77 years of age and in December had a bare  metal stent placed in the circumflex marginal vessel by Dr. Riley Kill. A bare-  metal stent was placed because the patient required hip surgery in the near  future. He had hip surgery about two weeks ago and presented to our  emergency room this morning with chest pain and EKG changes of acute  inferior and posterior wall myocardial infarction. He was taking Coumadin  post hip surgery and his INR was 1.7. He was not on Plavix.   PROCEDURE NOTE:  The procedure was performed via the left femoral artery  using arterial sheath and #6-French preformed  coronary catheters. A front  wall arterial punch was performed and Omnipaque contrast was used. After  completion of the diagnostic study, I made a decision to proceed with  intervention on the in-stent thrombosis in the circumflex artery.   The patient's ACT was 256 after 5000 units of heparin in the emergency  department. After we got the INR back to 1.7, we gave a double bolus  Integrilin infusion. We gave Plavix 300 mg at the end of the procedure.   We used a CLS4 6-French guiding catheter with side holes and an Marketing executive. We crossed the totally occluded stent with a wire without too  much difficulty. We went in with the diver catheter and performed 3 passes  and filled 2 syringes-full of blood and thrombus. We were able to remove a  core of thrombus. This reestablished flow. The patient became hypotensive  and required dopamine and fluids. He then stabilized and we dilated the  stent first with a 2.5 x 20  mm Maverick. We were debating whether to place a  new stent, but the result looked quite good, so we decided to expand the  stent further with a 2.75 x 20 mm Quantum Maverick. We performed one  inflation with 12 atmospheres for 30 seconds outside the stent extending to  the ostium and a second inflation within the stent up to 18 atmospheres for  30 seconds. Repeat diagnostic study was then performed with the guiding  catheter. The old stent was a 2.25 x 23 mm Vision that was post dilated with  a 2.5 stent.   RESULTS:  The aortic pressure was 107/59 with a mean of 80, and left  ventricular pressure was 107/12.   The left main coronary was free of significant disease.   The left anterior descending gave rise to two diagonal branches and three  septal perforators. There was a 50% proximal stenosis and a 70% stenosis in  the mid vessel and at the second diagonal branch which was a bifurcation  lesion.  The circumflex gave rise to an atrial branch and marginal branch. The second  marginal branch was a posterolateral branch. The second marginal branch was  completely occluded at the stent site.   The right coronary artery was a moderate size vessel and gave rise to a  conus branch, right ventricular branch, posterior descending branch, and a  small posterolateral branch. There was 50% proximal and 40% mid stenosis.  There was 40% stenosis distally to the posterior descending branch and  irregularities distally.   The left ventriculogram performed in the RAO projection showed hyperkinesis  of the mid inferior wall.   The left ventriculogram performed in the LAO projection showed akinesis of  the inferior and lateral wall. The overall wall motion was good with an  estimated ejection fraction of 60%.   Following aspiration thrombectomy and PTCA for in-stent re-stenosis,  stenosis improved from 100% to 10% and the flow improved from TIMI-2 to TIMI-  3 flow.   The patient had the onset  of chest pain at 11 a.m. and arrived in the  emergency room at 1345 hours and the first balloon inflation was at 1513  hours. This gave a reperfusion time of 4 hours 13 minutes and a dopamine  time of 1 hour 28 minutes.   CONCLUSION:  1.  Acute inferoposterior wall myocardial infarction with acute stent      thrombosis in the circumflex marginal vessel, 50% proximal, and 70% mid      stenosis in the left anterior descending artery with 70% stenosis in the      diagonal branch, 50% proximal, 40% mid, and 40% distal stenosis in the      right coronary artery and inferolateral wall akinesis.  2.  Successful aspiration thrombectomy and percutaneous transluminal      coronary angioplasty for in-stent stenosis in the circumflex marginal      vessel with improvement in eccentric narrowing from 100% to 10%.   DISPOSITION:  The patient was returned to post anesthesia care unit for  further observation. We probably can target discharge on day #2 and he  should remain on Plavix long-term.      BB/MEDQ  D:  06/03/2004  T:  06/03/2004  Job:  [id number]   cc:   Laurita Quint, M.D.  [address] [place]  [place] [address]  Fax: [phone]   Arvilla Meres, M.D. [facility]

## 2010-08-30 NOTE — Cardiovascular Report (Signed)
NAMEAYDIAN, DIMMICK NO.:  [id number]   MEDICAL RECORD NO.:  [id number]          PATIENT TYPE:  OIB   LOCATION:  2856                         FACILITY:  [facility]   PHYSICIAN:  Arturo Morton. Riley Kill, M.D. [facility] OF BIRTH:  [birth year]   DATE OF PROCEDURE:  04/09/2004  DATE OF DISCHARGE:                              CARDIAC CATHETERIZATION   PERCUTANEOUS INTERVENTION REPORT:   INDICATIONS FOR PROCEDURE:  Mr. Dase is a 77 year old patient who has been  evaluated by Dr. Gala Romney preoperatively for hip replacement.  The patient  has marked abnormality on Cardiolite imaging with inferolateral ischemia.  Diagnostic catheterization revealed scattered 3-vessel disease, but  particularly a very large marginal branch that truly represents a third OM.  The first and second OM's are insignificant. The third OM is subtotally  occluded, and there is evidence of some collateralization distally with slow  flow.  Based on the Cardiolite study, we discussed the options very  carefully.  With drug-eluding technology, it would be difficult to proceed  on with the operative procedure for his hips; therefore, it was felt that a  non-drug-eluding platform would be best, where we could treat him with  aspirin and Plavix x1 month and then he would be a candidate both for  Coumadin as well as discontinuation of Plavix.  We would continue, however,  his aspirin throughout the procedures.  We discussed this with the patient  and subsequently his family, and we proceeded on with percutaneous  intervention.   He was brought from the holding area.  He had been given 300 mg of oral  clopidogrel prior to the procedure.   PROCEDURE:  Percutaneous stenting of the third OM.   DESCRIPTION OF PROCEDURE:  The patient was brought to the catheterization  laboratory with in indwelling sheath. This was prepped and draped and, with  double glove technique, exchanged for a 7-French sheath.  Bivalirudin was  given according to protocol.  ACT was checked and found to be appropriate.  Following this, the lesion was crossed with a high-torque floppy wire.  Pre-  dilatation was done with a 2-mm balloon.  In addition, the lesion was then  pre-dilated throughout this segment.  Finally, a 23-mm length, 2.25 Guidant  monovision stent was placed across the lesion and taken up to approximately  12 amp.  This was followed by a post-dilatation with a 2.5-mm balloon  throughout the course of the stent, with marked improvement in appearance of  the very large vessel.  The patient tolerated the procedure well.  There was  marked improvement in the appearance of the artery.  There was also evidence  of the first OM being diffusely diseased distally, with some faint late  collateralization of this vessel as well.  This, however, was really quite  small.   ANGIOGRAPHIC DATA:  There was a first marginal branch which tapers out  distally into a small diffusely diseased vessel.  There is a second,  insignificant, marginal branch.  The third marginal branch is subtotally  occluded just beyond its ostium, and is a very large  vessel that courses out  over the inferolateral wall.  Following stenting, this is reduced from 99  down to 0%, with marked improvement in the appearance of the artery, and now  TIMI-3 flow.   CONCLUSION:  Successful percutaneous stenting of the third OM.   DISPOSITION:  The patient will be treated with aspirin and Plavix.  Follow  up will be with Dr. Gala Romney.  Hopefully, he can be treated medically  throughout the course of the hip replacement.  He will need to take aspirin  and Plavix for a minimum of 1 month.  We discussed the various options, and  we decided to use non-drug-eluding platform so that the patient could  actually have his hip operation in a fairly short period of time because he  is extremely symptomatic.       TDS/MEDQ  D:  04/09/2004  T:  04/09/2004  Job:  [id number]    cc:   Arvilla Meres, M.D. [facility] D. Riley Kill, M.D. [facility]   Robert A. Thurston Hole, M.D.  [address]  [place] [address]  Fax: [phone]   Laurita Quint, M.D.  [address] [place]  [place] [address]  Fax: [phone]   [facility]

## 2010-08-30 NOTE — Assessment & Plan Note (Signed)
[facility]                                 ON-CALL NOTE   MAXUM, CASSARINO                        MRN:          [id number]  DATE:05/27/2008                            DOB:          28-May-1924    CALLER:  Ann Maki, daughter.   Phone#:  [phone]   PRIMARY CARE PHYSICIAN:  Arta Silence, MD   SUBJECTIVE:  This is a patient with asthma and emphysema, on chronic  oxygen.  Over today, he has been having some increasing cough and  congestion and no change in color of the sputum.  No fever or no  increase in shortness of breath or wheezing.   ASSESSMENT AND PLAN:  Recommended Mucinex or Delsym throughout the  weekend.  He should be scheduled to be seen on Monday.  If he notes  change in color of sputum, fever, or increase in shortness of breath  from baseline, he is to be seen at an urgent care over the weekend.     Kerby Nora, MD  Electronically Signed    AB/MedQ  DD: 05/27/2008  DT: 05/27/2008  Job #: [id number]

## 2010-08-30 NOTE — Discharge Summary (Signed)
NAMEVALERY, CHANCE NO.:  [id number]   MEDICAL RECORD NO.:  [id number]          PATIENT TYPE:  INP   LOCATION:  2022                         FACILITY:  [facility]   PHYSICIAN:  Pricilla Riffle, M.D.    DATE OF BIRTH:  Jun 23, 1924   DATE OF ADMISSION:  06/03/2004  DATE OF DISCHARGE:  06/06/2004                           DISCHARGE SUMMARY - REFERRING   PROCEDURES:  Emergent coronary angiography/aspiration thrombectomy and  percutaneous transluminal coronary angiographic dilatation of circumflex  artery on June 03, 2004.   REASON FOR ADMISSION:  Mr. Rion is a 77 year old male with known coronary  artery disease, status post recent bare metal stenting of the circumflex  artery in December of 2005, who underwent recent left total hip replacement.  He presented to the emergency room with acute inferoposterior myocardial  infarction.  Please refer to the dictated admission note for full details.   LABORATORY DATA:  Cardiac enzymes:  Peak CPK 2543/436 (17%).  Lipid profile:  Total cholesterol 138, triglycerides 84, HDL 35, LDL 86.  Normal  electrolytes, renal function and liver enzymes.  INR 1.7 on admission and  1.4 at discharge.  Hemoglobin 12/hematocrit 35 at admission.   HOSPITAL COURSE:  Following stabilization in the emergency room where the  patient presented with chest pain and EKG changes suggestive of  inferoposterior myocardial infarction, he was placed on intravenous  nitroglycerin and heparin and taken to the cardiac catheterization  laboratory, undergoing procedure by Charlies Constable, M.D. (see report for full  details).  This revealed end-stent thrombosis of the obtuse marginal stent  site and was successfully treated with aspiration thrombectomy/PTCA  dilatation.  Residual anatomy revealed noncritical disease.  Left  ventricular function was normal.   Dr. Juanda Chance recommended treatment with long-term Plavix and overlapping of  Coumadin for one to two  weeks.   The postoperative course was benign.  Staples were removed by Dr. Sherene Sires  team, who followed the patient closely during his brief stay.   At the time of discharge, INR was 1.4.  Dr. Gala Romney recommended  discharging on Plavix, low-dose aspirin and Coumadin for two to three weeks.   The patient will have early followup pro times drawn at home with results  forwarded to Dr. Sherene Sires office, who will resume monitoring of Coumadin.  The current INR goal is 2.0-2.5.   MEDICATIONS AT DISCHARGE:  1.  Coumadin 7.5 mg (x 1 day) and then 5 mg daily as directed.  2.  Plavix 75 mg daily.  3.  Coated aspirin 81 mg daily.  4.  Altace 5 mg daily.  5.  Lipitor 5 mg q.h.s.  6.  Toprol XL 50 mg daily.  7.  Amaryl 2 mg daily.  8.  Nitrostat 0.4 mg as directed.   INSTRUCTIONS:  1.  Check pro time on Friday, June 07, 2004, to be drawn by home health      nursing and results forwarded to Dr. Thurston Hole for management (INR goal 2.0-      2.5).  2.  Refrain from any strenuous activity, heavy lifting or driving x 1 week.  3.  Call the office if there is any swelling/bleeding of the groin.  4.  Follow up with Arvilla Meres, M.D., on Tuesday, June 25, 2004, at      10:45 a.m.  5.  Arrange followup with Molly Maduro A. Thurston Hole, M.D.   DISCHARGE DIAGNOSES:  1.  Acute inferoposterior myocardial infarction.      1.  Secondary to end-stent thrombosis of obtuse marginal.  Treated with          aspiration thrombectomy/PTCA on June 03, 2004.      2.  Residual noncritical coronary artery disease.      3.  Normal left ventricular function.      4.  Status post stenting (bare metal) of obtuse marginal in December of          2005.  2.  Status post recent left total hip replacement.      1.  Coumadin anticoagulation (current INR goal 2.0-2.5).  3.  Dyslipidemia.  4.  Type 2 diabetes mellitus.  5.  History of hypertension.      GS/MEDQ  D:  06/06/2004  T:  06/06/2004  Job:  [id number]   cc:    Molly Maduro A. Thurston Hole, M.D.  [address]  [place] [address]  Fax: [phone]

## 2010-08-30 NOTE — Op Note (Signed)
NAMERIVAN, SIORDIA                 ACCOUNT NO.:  [id number]   MEDICAL RECORD NO.:  [id number]          PATIENT TYPE:  INP   LOCATION:  2550                         FACILITY:  [facility]   PHYSICIAN:  Robert A. Thurston Hole, M.D. DATE OF BIRTH:  1925-01-01   DATE OF PROCEDURE:  05/20/2004  DATE OF DISCHARGE:                                 OPERATIVE REPORT   PREOPERATIVE DIAGNOSIS:  Left hip degenerative joint disease.   POSTOPERATIVE DIAGNOSIS:  Left hip degenerative joint disease.   PROCEDURE:  Left total hip replacement using [employer] total hip system with  a #54 acetabulum, PSL,press-fit with two locking screws and 100degree  polyethylene liner.  Femoral component #5 cemented EON-Plus femoral stem  with +10 x 36 mm femoral head with #1 cement plug.   SURGEON:  Elana Alm. Thurston Hole, M.D.   ASSISTANT:  Julien Girt, P.A.   ANESTHESIA:  General.   OPERATIVE TIME:  1 hour and 40 minutes.   ESTIMATED BLOOD LOSS:  450 cc.   COMPLICATIONS:  None.   DESCRIPTION OF PROCEDURE:  Mr. Coombs was brought to the operating room on  May 20, 2004, placed on the operative table in supine position.  He  received vancomycin 1 g IV preoperatively for prophylaxis.  He had a Foley  catheter placed under sterile conditions.  His left hip was examined.  He  had flexion to 90 extension to 0, internal and external rotation of 20  degrees.  Leg lengths were approximately equal.  He was then placed in left  lateral-up decubitus position and secured on the bed with a Mark frame.  His  left hip and leg was prepped in sterile DuraPrep and draped using sterile  technique.  Originally through a 15 cm posterolateral greater trochanteric  incision, initial exposure was made.  The underlying subcutaneous tissues  were incised in line with the skin incision.  The iliotibial band and  gluteus maximus fascia was incised longitudinally, revealing the underlying  sciatic, nerve which was carefully protected.  The  short external rotators  of the hip and hip capsule were released off their femoral neck insertion  intact.  The hip was posteriorly dislocated.  Found to have significant  grade 3 and 4 DJD of the femoral head.  A femoral neck cut was made 1.5-2 cm  above the lesser trochanter in the appropriate manner of abduction and  anteversion and inclination.  Carefully-placed retractors were placed around  the acetabulum.  He was found to have grade 3 to 4 DJD of the acetabulum as  well.  Degenerative acetabulum and labrum were was also removed.  Sequential  acetabular reamers were then used to ream up to 54 mm size in the  appropriate manner of abduction, anteversion and inclination.  A 54 mm trial  was placed.  This was found be an excellent fit.  It was then removed and  the actual 54 mm PSL cup was placed and hammered into position with an  excellent fit in the appropriate manner anteversion, abduction and  inclination.  It was further secured in place with two locking  screws, both  20 mm locking screws, in the 11 and 12 o'clock position.  A 10 degrees  polyethylene liner with a 10-degree lip in the posterolateral position was  then placed as well. At this point the proximal femur was exposed.  Sequential axial reamers were used to ream the femur to a #5 size, followed  by broaches to a #5 size, and with a #5 broach in place,  a +10 x 36 mm  femoral head trial was placed.  This was found to give excellent restoration  of normal, excellent stability.  Leg lengths were found to be equalized.  At  this point, then the femoral component was removed.  The femoral canal was  measured for a cement plug.  A #1 was found the appropriate size , a #1 cement plug was placed.  The  femoral canal was then jet lavage-irrigated with 3 L of saline solution and  then filled with bone cement, and then a #5 EON Plus stem was placed and  hammered into position with an excellent fit, excess cement being removed   from around the edges.  After the cement hardened, a +10 x 36 mm femoral  head was hammered onto the femoral neck with an excellent Morse taper fit.  The hip was reduced, taken to a full range of motion, and found to be stable  up to 70 degrees of internal rotation in both neutral and 30 degrees of  adduction and also stable in abduction and external rotation.  Leg lengths  were found to be equal.  At this point it was felt that all components were  of excellent size, fit and stability.  The wound was further irrigated and  then the short external rotators of the hip and hip capsule were reattached  to their femoral neck insertion through two drill holes of the greater  trochanter.  The iliotibial band and gluteus maximus fascia was closed with  #1 Ethibond suture.  Subcutaneous tissues closed with 0 and 2-0 Vicryl, skin  closed with skin staples.  Sterile dressings were applied.  Hip abduction  pillow placed.  The patient turned supine, awakened, extubated and taken to  the recovery room in stable condition.  Sponge and needle count was correct  x2 in the case.  Leg lengths were found to be equal, neurovascular status  was normal.      RAW/MEDQ  D:  05/20/2004  T:  05/20/2004  Job:  [id number]

## 2010-08-30 NOTE — Discharge Summary (Signed)
[name], [name]                 ACCOUNT NO.:  [id number]   MEDICAL RECORD NO.:  [id number]          PATIENT TYPE:  INP   LOCATION:  5006                         FACILITY:  [facility]   PHYSICIAN:  Robert A. Thurston Hole, M.D. DATE OF BIRTH:  [birth year]   DATE OF ADMISSION:  05/20/2004  DATE OF DISCHARGE:  05/23/2004                                 DISCHARGE SUMMARY   ADMISSION DIAGNOSES:  1.  End-stage degenerative joint disease, left hip.  2.  Coronary artery disease.  3.  Hypertension.  4.  Diabetes.   DISCHARGE DIAGNOSES:  1.  End-stage degenerative joint disease, left hip.  2.  Coronary artery disease.  3.  Hypertension.  4.  Diabetes.  5.  Postoperative blood loss anemia.   HISTORY OF PRESENT ILLNESS:  The patient is a 77 year old white male with a  history of coronary artery disease, hypertension, and diabetes. He has end-  stage DJD of his left hip. He has failed conservative care including anti-  inflammatories and intra-articular cortisone injections. He understands the  risks, benefits, and possible complications of a left total hip replacement  and is without question. He was medically cleared preoperatively by [facility].   PROCEDURES IN-HOUSE:  On May 20, 2004, the patient underwent a left  total hip replacement by Dr. Thurston Hole. He tolerated the procedure well.  Postoperatively, [facility] was consulted for coronary artery and  hypertensive management. The patient was placed on vancomycin for DVT  prophylaxis because he is allergic to PENICILLIN. On postoperative day one,  vital signs were stable with a blood pressure of 124/58, temperature 97,  hemoglobin of 9.9, and was metabolically stable. He was placed on sliding  scale insulin. His PCA was discontinued, physical therapy was started, and  he was placed on Percocet for pain. On postoperative day two, the patient  had a T-max of 100.6, his hemoglobin was 8.2, and his blood pressure was  100/55. He  received  two units of packed red blood cells with 10 mg of Lasix  IV between units. His potassium was 3.9, so potassium supplementation was  needed. The patient was evaluated for rehab. On postoperative day three the  patient was doing well. His hemoglobin was 10.1 post transfusion and he was  metabolically stable. His INR was 1.3. His surgical wound was well  approximated. The patient was transferred to [facility] in stable condition. He is  weightbearing as tolerated, on a  regular diet, on Percocet for pain, Robaxin for muscle spasm, Lipitor 10 mg  half tablet p.o. q.h.s., Altace 5 mg p.o. daily, Toprol-XL 25 mg, Amaryl 4  mg half tablet p.o. daily, and Coumadin to maintain his INR between 2.0 and  3.0. We will continue to follow him on rehab.       KS/MEDQ  D:  08/21/2004  T:  08/21/2004  Job:  [id number]

## 2010-08-30 NOTE — Op Note (Signed)
NAMESANTANNA, [name] NO.:  [id number]   MEDICAL RECORD NO.:  [id number]                   PATIENT TYPE:  INP   LOCATION:  5735                                 FACILITY:  [facility]   PHYSICIAN:  Jetty Duhamel, M.D.          DATE OF BIRTH:  1924/11/01   DATE OF PROCEDURE:  01/27/2003  DATE OF DISCHARGE:                                 OPERATIVE REPORT   PREOPERATIVE DIAGNOSES:  1. Acute cholecystitis.  2. Cholelithiasis.   POSTOPERATIVE DIAGNOSES:  1. Acute gangrenous cholecystitis.  2. Cholelithiasis.  3. Obstructive distal common bile duct stone.   PROCEDURES:  1. Laparoscopic cholecystectomy.  2. Intraoperative cholangiogram.   SURGEON:  Marta Lamas. Gae Bon, M.D.   ASSISTANT:  None.   ANESTHESIA:  General endotracheal.   ESTIMATED BLOOD LOSS:  50-75 mL.   COMPLICATIONS:  Obstructing stone.   CONDITION:  Stable.   INTRAOPERATIVE CONSULTATION:  Althea Grimmer. Luther Parody, M.D.   INDICATIONS:  The patient is a 77 year old with abdominal pain in the right  upper quadrant and an ultrasound demonstrating acute cholecystitis.  He  comes in for a laparoscopic cholecystectomy.   FINDINGS:  The patient had all of the stigmata of an acute cholecystitis  with omental adhesions, reddened erythematous gallbladder, and hydrops of  the gallbladder initially.  It subsequently drained pus, representing an  empyema of the gallbladder, and then the cholangiogram showed, which was  difficult to do likely because of a distal obstructing cystic duct stone,  obstruction of the distal common bile duct.   DESCRIPTION OF PROCEDURE:  The patient was taken to the operating room and  placed on the table in the supine position.  After an adequate endotracheal  anesthetic was administered, he was prepped and draped in the usual sterile  manner, exposing the midline and the right upper quadrant.   A supraumbilical incision was made using a #11 blade, and because  the  patient had a small umbilical hernia, we could get into the peritoneum  immediately using a hemostat clamp.  We then passed an 11-12 mm cannula  through the hernia defect into the peritoneal cavity and confirmed its  position with the laparoscope and attached camera source after insufflation  of carbon dioxide gas was instilled into the peritoneal cavity up to a  maximal pressure of 15 mmHg.  We then passed two 5 mm cannulas in the right  costal margin and a subxiphoid 11-12 mm cannula under direct vision.  We  then placed the patient in steep reverse Trendelenburg position.  The left  side was tilted down and the dissection begun.   The initial omental adhesions to the dome of the gallbladder were bluntly  dissected away.  We then grasped the dome of the gallbladder with the  ratcheted grasper through the lateral most 5 mm cannula.  This caused  rupture of the gallbladder and spillage of  clear bile onto the field, which  was aspirated.  We placed a second dissector or grasper on the infundibulum  and then exposed the peritoneum overlying the triangle of Calot and the  hepatoduodenal triangle.  We were able to dissect out the cystic duct, the  cystic artery and this inflammatory mass and subsequently placed two clips  proximally on the cystic artery and one clip on the gallbladder side of the  cystic duct.  The cholecystodochotomy was done which did not allow passage  of the [facility] catheter into the cholecystodochotomy.  We subsequently made one  closer to the common bile duct which could be visualized and milked out  several pigmented stones.  We then were able to pass the catheter; however,  it was still with some difficulty that we did pass it and then subsequently  shot the cholangiogram, showing distal common bile duct obstruction.  It is  theorized at this point that there was a stone in the distal cystic duct  which did fall into the common bile duct during the procedure.    Once we had the cholangiogram done, we triply clipped the cystic duct on the  common bile duct side, transected the cystic duct, and then subsequently  transected the cystic artery.  There was a posterior branch of the cystic  artery which also was ligated with endoclips.   We dissected the gallbladder out of its bed, and up near the top of the  gallbladder, it was necrotic in the hepatic bed with just necrotic debris  which peeled off.  We obtained hemostasis with electrocautery.  We used an  Endocatch bag to pull the gallbladder out through the supraumbilical site.  We used a piece of Surgicel in the gallbladder bed, although hemostasis was  adequate.  We irrigated it with approximately 3 L of warm saline solution,  and then we subsequently removed all cannulas, aspirating gas as we came  out.   The supraumbilical site was closed using a figure-of-eight stitch of #0  Vicryl, passed on an UR6 needle; 0.25% Marcaine was injected also.  A  subcuticular suture of 4-0 Vicryl was passed at all sites.  Laparotomy and  instrument counts were correct; however, the needle count was incorrect,  having one extra needle that was not counted initially.  X-ray was done  postoperatively which failed to show the presence of any needle in the  abdomen.  Sterile dressings were applied to the wound.                                               Jetty Duhamel, M.D.    JOW/MEDQ  D:  01/27/2003  T:  01/27/2003  Job:  [id number]   cc:   Althea Grimmer. Luther Parody, M.D.  [address]  [place]  [place] [address]  Fax: [phone]

## 2010-08-30 NOTE — Discharge Summary (Signed)
NAMEROSHAUN, POUND NO.:  [id number]   MEDICAL RECORD NO.:  [id number]          PATIENT TYPE:  ORB   LOCATION:  4531                         FACILITY:  [facility]   PHYSICIAN:  Ranelle Oyster, M.D.DATE OF BIRTH:  1924-11-02   DATE OF ADMISSION:  05/23/2004  DATE OF DISCHARGE:  05/29/2004                                 DISCHARGE SUMMARY   DISCHARGE DIAGNOSES:  1.  Left total hip replacement secondary to osteoarthritis on May 20, 2004.  2.  Pain management.  3.  Coumadin for deep venous thrombosis prophylaxis.  4.  Anemia.  5.  Hypertension.  6.  Non-insulin dependent diabetes mellitus.  7.  Hyperlipidemia.  8.  Coronary artery disease.   HISTORY OF PRESENT ILLNESS:  This is a 77 year old, white male who was  admitted on February 6, with end-stage changes of the left hip and no relief  with conservative care.  Underwent a left total hip replacement on February  6, per Dr. Thurston Hole.  Placed on Coumadin for deep venous thrombosis  prophylaxis and weightbearing as tolerated.  Postoperative pain control with  PCA discontinued.  Anemia 8.2 and transfused 2 units of packed red blood  cells on May 22, 2004.  Followup hemoglobin of 10.1.  No chest pain and  no nausea or vomiting.  Admitted to subacute care services.   PAST MEDICAL HISTORY:  See discharge diagnoses.   ALLERGIES:  PENICILLIN.   SOCIAL HISTORY:  Remote smoker, no alcohol.  Lives with his wife in  [place].  Wife with a history of bilateral knee replacement and uses a  cane.  Local son works.   MEDICATIONS:  1.  Altace 5 mg daily.  2.  Ecotrin daily.  3.  Amaryl 2 mg daily.  4.  Plavix 75 mg daily.  5.  Toprol XL daily.  6.  Lipitor daily.   HOSPITAL COURSE:  The patient with progressive gains while on rehabilitation  services with therapies initiated daily.  The following issues were followed  during the patient's rehabilitation course:  Pertaining to Mr. Weyenberg left  total hip  replacement, surgical site healing nicely, weightbearing as  tolerated with hip precautions.  Surgical site with staples intact.  He  would follow up with Dr. Thurston Hole.  Home Health therapies had been arranged  with [place].  Pain management ongoing with the use of oxycodone and good  results.  He had some initial nausea felt to be related to his iron pill  which was discontinued and nausea resolved.  He remained on Coumadin for  deep venous thrombosis prophylaxis with latest INR of 2.0.  He would  complete Coumadin protocol followed by [place].  The patient had been on  aspirin and Plavix therapy prior to hospital admission for his coronary  artery disease.  This would be resumed after Coumadin completed.  Blood  sugars ranged 145-153.  Diabetic diet as well as his Amaryl 2 mg daily as  prior to hospital admission.  Zocor ongoing for hyperlipidemia.  He had no  bowel or bladder disturbances.  Overall  for his functional status, he was  modified independent with his mobility 200 feet, supervision bed mobility  and transfers.  Plan was to be discharged to home on May 29, 2004, with  home health therapies.  He required simple set up for activities of daily  living with minimal assist for lower body dressing.   DISCHARGE LABORATORY DATA AND X-RAY FINDINGS:  Latest labs with INR 2.0.  Hemoglobin 9.8, hematocrit 27.9.  Sodium 139, potassium 3.6, BUN 9,  creatinine 0.8.   DISCHARGE MEDICATIONS:  1.  Coumadin 5 mg to be completed on June 17, 2004.  2.  Altace 5 mg daily.  3.  Amaryl 2 mg daily.  4.  Lipitor daily.  5.  Toprol XL 50 mg daily.  6.  Oxycodone as needed for pain.  7.  Tylenol as needed.  8.  The patient was advised to resume his Plavix/aspirin therapy after      Coumadin was completed.   ACTIVITY:  Total hip precautions.   DIET:  Diabetic diet.   SPECIAL INSTRUCTIONS:  Home health nurse per Genevieve Norlander to check INR on Friday,  February 17.   FOLLOW UP:  Follow up with Dr.  Thurston Hole for staple removal in 1 week.  Call if  any increased redness, fever or drainage.      DA/MEDQ  D:  05/28/2004  T:  05/28/2004  Job:  [id number]   cc:   Molly Maduro A. Thurston Hole, M.D.  [address]  [place] [address]  Fax: [phone]   [facility]

## 2010-08-30 NOTE — Discharge Summary (Signed)
[name], PEPPARD NO.:  [id number]   MEDICAL RECORD NO.:  [id number]          PATIENT TYPE:  IPS   LOCATION:  4032                         FACILITY:  [facility]   PHYSICIAN:  Erick Colace, M.D.DATE OF BIRTH:  Apr 12, 1925   DATE OF ADMISSION:  05/30/2005  DATE OF DISCHARGE:  06/06/2005                                 DISCHARGE SUMMARY   DISCHARGE DIAGNOSES:  1.  Right total hip replacement, February 12, secondary to osteoarthritis.  2.  Pain management.  3.  Coumadin for deep vein thrombosis prophylaxis.  4.  Postoperative anemia.  5.  Coronary artery disease with percutaneous transluminal coronary      angioplasty.  6.  Non-insulin dependent diabetes mellitus.  7.  Hypertension.  8.  Hyperlipidemia.  9.  History of a left total hip replacement in 2006.   HISTORY OF PRESENT ILLNESS:  This is a 77 year old male with a history of a  left total hip replacement in 2006, admitted May 26, 2005 with advanced  right hip pain secondary to degenerative changes and no relief with  conservative care. He underwent a right total hip replacement on February 12  per Dr. Thurston Hole. He was placed on Coumadin for deep vein thrombosis  prophylaxis, weightbearing as tolerated. Postoperative anemia, 7.7,  transfused two units of packed red blood cells, improved to 9.4. Foley  catheter tube removed February 15. Mild hypokalemia 3.4, received potassium  supplement of potassium 40 mEq x 1 on February 16. The patient was admitted  to inpatient rehab services.   PAST MEDICAL HISTORY:  See discharge diagnoses.   HABITS:  No alcohol, remote smoker.   ALLERGIES:  PENICILLIN, BIAXIN, AND ADHESIVE TAPE.   SOCIAL HISTORY:  Married, lives in [place]. The patient is retired. One  level home, 3-4 steps to entry.   MEDICATIONS PRIOR TO ADMISSION:  1.  Altace 5 mg daily.  2.  Amaryl 2 mg daily.  3.  Ecotrin 81 mg daily.  4.  Plavix 75 mg daily.  5.  Lipitor 40 mg daily.   REHABILITATION HOSPITAL COURSE:  The patient was admitted to inpatient rehab  services with therapies initiated on a b.i.d. basis consisting of physical  therapy, occupational therapy and rehabilitation nursing. The following  issues were addressed during the patient's rehabilitation stay. Pertaining  to Mr. Brunsman right total hip replacement on February 12, surgical site  healing nicely, no signs of infection, ambulating extended distances with a  walker, weightbearing as tolerated, neurovascular sensation intact. He  remained on Vicodin and Robaxin as needed for pain with good results.  Coumadin for deep vein thrombosis prophylaxis. Latest INR of 2.7. He had  been on subcutaneous Lovenox to an INR greater than 2. Postoperative anemia  with hemoglobin of 10, hematocrit 28.8. He had a history of coronary artery  disease with PTCA as he would continue with his Plavix and aspirin therapy.  Blood sugars had some mild variables, 79, 143, 109. His Amaryl had recently  been increased to 4 mg on February 17 and monitored. Blood pressures with  Toprol decreased to 25 mg  daily on February 17 due to some orthostatic  changes and monitored latest blood pressure with a diastolic of 53, heart  rate of 55. He remained without any complaints of dizziness or light-  headedness. He had no bowel or bladder disturbances. He was ambulating  extended distances with the walker, essentially independent to standby  assist in all areas of activities of daily living, dressing, grooming and  homemaking.   DISPOSITION:  He was discharged to home.   DISCHARGE MEDICATIONS AT THE TIME OF DICTATION:  1.  Coumadin 4 mg daily, to be completed on June 23, 2005 and stopped.  2.  Protonix 40 mg daily.  3.  Altace 5 mg daily.  4.  Aspirin 81 mg daily.  5.  Plavix 75 mg daily.  6.  Lipitor 40 mg daily.  7.  Amaryl 4 mg daily.  8.  Vicodin 5/500 one or two tablets every 4 hours as needed for pain.  9.  Robaxin 500 mg  every 6 hours as needed for spasms.   DISCHARGE ACTIVITIES:  As tolerated.   DISCHARGE DIET:  Diabetic diet.   SPECIAL INSTRUCTIONS:  Home health nurse per Genevieve Norlander, to complete Coumadin  protocol. The patient should follow up with Dr. Thurston Hole, orthopedic services;  call for appointment.      Mariam Dollar, P.A.      Erick Colace, M.D.  Electronically Signed    DA/MEDQ  D:  06/05/2005  T:  06/05/2005  Job:  [id number]   cc:   Erick Colace, M.D.  Fax: [phone]   Elana Alm. Thurston Hole, M.D.  Fax: [phone]

## 2010-08-30 NOTE — Discharge Summary (Signed)
   NAMENIZAR, CUTLER                           ACCOUNT NO.:  [id number]   MEDICAL RECORD NO.:  [id number]                   PATIENT TYPE:  INP   LOCATION:  5735                                 FACILITY:  [facility]   PHYSICIAN:  Jimmye Norman III, M.D.               DATE OF BIRTH:  [birth year]   DATE OF ADMISSION:  01/26/2003  DATE OF DISCHARGE:  01/31/2003                                 DISCHARGE SUMMARY   DISCHARGE DIAGNOSIS:  Acute cholecystitis with common bile duct obstruction.   PRINCIPAL PROCEDURE:  1. Laparoscopic cholecystectomy with cholangiogram.  2. ERCP with retrievable stones and sphincterotomy.   SURGEON:  Jimmye Norman, M.D.   ENDOSCOPIST:  Althea Grimmer. Santogade, M.D.   DISPOSITION:  He was discharged home on four days.   DISCHARGE MEDICATIONS:  .  1. Ciprofloxacin.  2. Vicodin.   FOLLOWUP:  He was to see me in two weeks.   HOSPITAL COURSE:  The patient was a 77 year old admitted with fevers,  abdominal pain, nausea but no vomiting to the emergency room.  He had had a  previous history of right inguinal hernia repair, and he was taken to  surgery the day after admission which was on October 15 at which time he  underwent a lap chole with cholangiogram.  The cholangiogram showed that the  common bile duct was obstructed.  He subsequently underwent an ERCP which  went successfully with sphincterotomy and retrieval of stone.  He was able  to remove the stone, and the patient went home two days after that  tolerating a diet well, afebrile and doing well.  He was to follow up to see  me in two weeks.  He was otherwise doing well.                                                Kathrin Ruddy, M.D.    JW/MEDQ  D:  02/15/2003  T:  02/16/2003  Job:  [id number]

## 2010-08-30 NOTE — Cardiovascular Report (Signed)
NAMEAZLAN, HANWAY NO.:  [id number]   MEDICAL RECORD NO.:  [id number]          PATIENT TYPE:  OIB   LOCATION:  2856                         FACILITY:  [facility]   PHYSICIAN:  Arvilla Meres, M.D. LHCDATE OF BIRTH:  Sep 11, 1924   DATE OF PROCEDURE:  04/09/2004  DATE OF DISCHARGE:                              CARDIAC CATHETERIZATION   PRIMARY CARE PHYSICIAN:  Molly Maduro A. Thurston Hole, M.D.   CARDIOLOGIST:  Arvilla Meres, M.D. [facility]   PATIENT IDENTIFICATION:  Mr. Kriesel is a very pleasant 77 year old male  without known history of coronary artery disease who is referred for  catheterization after having a positive functional study which was done on  March 25, 2004 which showed a small inferior lateral infarct with  periinfarct ischemia.  This study was done for preoperative clearance prior  to his hip replacement.  Of note, although he is limited in mobility by his  hip pain, he still manages to do all his activities of daily living without  any exertional chest pain or shortness of breath.   PROCEDURE PERFORMED:  1.  Left heart catheterization.  2.  Left ventriculogram.  3.  Selective coronary angiography.   DESCRIPTION OF PROCEDURE:  The risks and benefits of the procedure were  explained to Mr. Parkison.  Consent was signed and placed on the chart.  The  right groin area was prepped and draped in routine sterile fashion.  The  area was then anesthetized with 1% local lidocaine.  A 6 French arterial  sheath was placed in the right femoral artery using the modified Seldinger  technique.  Standard catheters were used throughout the procedure including  a JL-4, JR-4, and 6 French bent pigtail.  All catheter exchanges were made  over wire.  There are no obvious complications.  After the case, the patient  was taken to the recovery room in stable condition.   FINDINGS:   HEMODYNAMICS:  Aortic pressure was 120/58 with mean of 82.  LV was 99/10.  There was no gradient on  aortic valve pullback.   CORONARY ARTERIES:  1.  Left main:  Normal.  2.  LAD:  Vessel that wraps the apex.  It is calcified proximally.  There is      diffuse nonobstructive disease from the mid portion down.  There is a      50% focal stenosis in the mid portion.  There is an 80% ostial lesion in      a very small second diagonal.  3.  Left circumflex:  This gives off a tiny OM-1 and a large branching OM-2.      There is a 99% lesion in the mid portion of the OM-2 which is      approximately 1.0 to 2 mm vessel.  4.  Right coronary artery:  Dominant vessel with large PDA and two small      PLs.  There was initially fairly high grade stenosis at the ostium of      the RCA with catheter damping.  However, intracoronary nitroglycerin was      given and subsequently  the lesion appeared to be about 60%.  There were      multiple focal 50 and 60% lesions throughout the mid section of the RCA.      There was a 70% distal lesion just prior to the take off of the PDA and      a 95% focal distal lesion in the RCA at the take off of the PDA.   The left ventriculogram done in the RAO approach shows an EF of  approximately 55% with no significant mitral regurgitation.   ASSESSMENT/PLAN:  Mr. Pisarski has  two-vessel coronary artery disease with  positive functional study.  He is relatively asymptomatic.  However, his  capacity is limited by his osteoarthritis and need for hip replacement.  He  is currently scheduled to undergo hip replacement.  However, he will likely  need to have coronary vascularization prior to this.  I will review the  films with our interventionalist Dr. Bonnee Quin today for possible PCI of  the OM and RCA.  He will also need aggressive risk factor management of his  coronary artery disease.   DISPOSITION:  Pending review of films with Dr. Riley Kill.      Dani   DB/MEDQ  D:  04/09/2004  T:  04/09/2004  Job:  [id number]   cc:   Molly Maduro A. Thurston Hole, M.D.  [address]  [place] [address]  Fax: [phone]

## 2010-08-30 NOTE — H&P (Signed)
[name], [name]                           ACCOUNT NO.:  [id number]   MEDICAL RECORD NO.:  [id number]                   PATIENT TYPE:  INP   LOCATION:  5735                                 FACILITY:  [facility]   PHYSICIAN:  Jimmye Norman III, M.D.               DATE OF BIRTH:  12-26-1924   DATE OF ADMISSION:  01/26/2003  DATE OF DISCHARGE:                                HISTORY & PHYSICAL   CHIEF COMPLAINT:  The patient is a 77 year old non-insulin dependent  diabetic with acute cholecystitis.   HISTORY OF PRESENT ILLNESS:  The patient has had three to four days of  abdominal pain in the epigastrium and right upper quadrant.  This is  associated with nausea, but no vomiting.  He came to the emergency room  where he was found to have a thickened gallbladder wall on ultrasound along  with sludge and stones.  There was also some evidence of pericholecystic  fluid.  He was found to have acute cholecystitis.  Surgical consultation was  obtained.   His primary care physician is Dr. Hetty Ely.  His medications include Amaryl,  dosage unknown, Altace, dosage unknown, and Lipitor, dosage unknown.   ALLERGIES:  No known drug allergies.   PAST SURGICAL HISTORY:  A right inguinal hernia repair x3.   REVIEW OF SYSTEMS:  The patient had a normal bowel movement yesterday, but  has not had any since then.  He has not been jaundice during this process.  His appetite had been good up until recently.   PHYSICAL EXAMINATION:  GENERAL:  He is a well-nourished gentleman appears to  be in mild distress.  VITAL SIGNS:  His temperature on admission was 98.8.  He has risen to 101.6  by the time I examined him.  His other vital signs are stable.  He is  tachycardic at 116.  HEENT:  Normocephalic, atraumatic, and nonicteric.  NECK:  Supple, no bruits.  CHEST:  Clear, no rales, rhonchi, or wheezes.  CARDIAC:  Tachycardia with no murmurs, gallops, rubs, or heaves.  No  irregularity.  ABDOMEN:  Tender in  the right upper quadrant with guarding and rebound in  that area, and a questionable palpable fullness in that area.  Diffusely, he  has no peritonitis and actually has normoactive bowel sounds.  RECTAL:  Deferred.  NEUROLOGIC:  Cranial nerves II-XII are grossly intact.   LABORATORY DATA:  A normal white blood cell count, but a marked shift.  Hemoglobin of 15.4, hematocrit of 44.8.  Electrolytes within normal limits,  as were BUN and creatinine.  All liver function tests were normal.  No  coag's were done.   IMPRESSION:  Clinically and based on ultrasound the patient has acute  cholecystitis and as a diabetic possibly has a gangrenous gallbladder.  He  is hemodynamically stable at this point with a high fever, and I prefer to  give him intravenous  antibiotics prior to taking him to the operating room  for a cholecystectomy.  The patient has been started  on Zosyn and will receive three doses prior to going to the operating room  tomorrow morning for a laparoscopic possible open cholecystectomy.  The  risks and benefits of both procedures have been explained to the patient,  and he wishes to go ahead as a semi-emergency at this point.                                                  Kathrin Ruddy, M.D.    JW/MEDQ  D:  01/27/2003  T:  01/27/2003  Job:  [id number]

## 2010-08-30 NOTE — Assessment & Plan Note (Signed)
[facility]                            CARDIOLOGY OFFICE NOTE   [name], [name]                        MRN:          [id number]  DATE:08/12/2006                            DOB:          10/30/1924    REFERRING PHYSICIAN:  Arta Silence, MD   INTERVAL HISTORY:  [name] is a delightful 77 year old man with  coronary artery disease with PCA and stenting of a 3rd marginal with  nondrug-eluting stent December 2005.  This was complicated with stent  thrombosis after discontinuation of Plavix resulting in acute  inferoposterior myocardial infarction.  He was treated with repeat  stenting.   Remainder of his medical history is notable for:  1. Diabetes.  2. Hypertension.  3. Hyperlipidemia.  4. COPD.  5. Bradycardia.  6. He also has osteoarthritis.  7. Status post bilateral hip replacements.   He presents today with his wife for routine followup.  He states that he  gets short of breath with almost any activity.  His wife thinks that  this is significantly worse than before, and he agrees.  He denies any  chest pain or lower extremity edema.  No orthopnea or PND.  He has tried  an inhaler without much help.  He denies any syncope or palpitations.  His wife does note that he falls asleep very quickly in front of the TV,  and is snores very heavily, and often wakes himself up due to snoring.  No witnessed apnea.   MEDICATIONS:  1. Altace 5 a day.  2. Aspirin 81 a day.  3. Plavix 75 a day.  4. Toprol XL 25.  5. Amaryl 4.  6. Spiriva.  7. Lipitor 40 a day.   PHYSICAL EXAMINATION:  He is an elderly male, in no acute distress.  He  ambulates around the clinic slowly without any respiratory difficulty.  Blood pressure is 114/60.  Heart rate is 50.  Weight is 179.  HEENT:  Sclerae anicteric.  EOMI.  There is no xanthelasma.  Mucous  membranes are moist.  Oropharynx is clear.  NECK:  Supple.  No JVD.  Carotids are 2+ bilaterally without any  bruits.  There is no lymphadenopathy or thyromegaly.  CARDIAC:  He is bradycardic with mildly irregular with an S4.  No  murmur.  LUNGS:  Clear with decreased air movement throughout.  ABDOMEN:  Soft, non-tender, non-distended.  No hepatosplenomegaly.  No  bruits.  No masses.  Good bowel sounds.  EXTREMITIES:  Warm with no cyanosis, clubbing, or edema.  No rash.  NEUROLOGIC:  He is alert and oriented x3.  Cranial nerves 2 through 12  are intact.  He moves all 4 extremities without difficulty.  He is  slightly hard of hearing.  Affect is very pleasant.  EKG:  Sinus bradycardia with occasional PACs at a rate of 50,  nonspecific T wave flattening, which is unchanged from previous.   ASSESSMENT AND PLAN:  1. Progressive dyspnea.  His previous coronary disease was relatively      asymptomatic.  Thus, we will go ahead with an adenosine Myoview  to      evaluate for possible underlying ischemia.  We will also put a 48-      hour Holter monitor to see if his bradycardia may also be playing a      role.  We have trying to keep on a beta blocker due to his previous      myocardial infarction.  He also has a history that is very      concerning for obstructive sleep apnea, and we will refer him for      evaluation.  2. Hypertension.  Well controlled.  3. Hyperlipidemia.  Most recent LDL was 65, which is at goal.  We will      continue current therapy.   DISPOSITION:  Return to clinic in 2 months.  He is to call me if his  symptoms are getting worse.     [name], MD     DRB/MedQ  DD: 08/12/2006  DT: 08/12/2006  Job #: [id number]   cc:   Arta Silence, MD

## 2010-08-30 NOTE — Op Note (Signed)
[name], KEEP NO.:  [id number]   MEDICAL RECORD NO.:  [id number]          PATIENT TYPE:  INP   LOCATION:  2899                         FACILITY:  [facility]   PHYSICIAN:  Elana Alm. Thurston Hole, M.D. DATE OF BIRTH:  February 16, 1925   DATE OF PROCEDURE:  05/26/2005  DATE OF DISCHARGE:                                 OPERATIVE REPORT   PREOPERATIVE DIAGNOSIS:  Right hip DJD.   POSTOPERATIVE DIAGNOSIS:  Right hip DJD.   PROCEDURE:  Right total hip replacement using DePuy cemented total hip  system with acetabulum 58 mm Press-Fit acetabulum with two locking screws  and 10 degrees polyethylene liner. The femoral component #4 Summit cemented  femoral component with +5 x 32 mm femoral head with #3 cement plug.   SURGEON:  Elana Alm. Thurston Hole, M.D.   ASSISTANT:  Julien Girt, P.A.   ANESTHESIA:  General.   OPERATIVE TIME:  1 hour 30 minutes.   ESTIMATED BLOOD LOSS:  450 mL.   COMPLICATIONS:  None.   DESCRIPTION OF PROCEDURE:  Mr. Lipsett was brought to operating room on  05/26/2005, placed operative table supine position. After adequate level  general anesthesia was obtained, he had a Foley catheter placed under  sterile conditions and received vancomycin 1 gram IV preoperatively for  prophylaxis. His right hip was examined under anesthesia. He had flexion to  90, extension to 0, internal external rotation of 10 degrees with 1 inch  shortening of the right leg compared to the left. After being placed under  general anesthesia, he was then turned the right lateral decubitus position  and secured on the bed with a Mark frame. His right hip and leg was prepped  in sterile DuraPrep and draped using sterile technique. Originally through a  15 cm posterolateral greater trochanteric incision based over the greater  trochanter initial exposure was made.  The underlying subcutaneous tissues  were incised in line with the skin incision. The iliotibial band and gluteus  maximus  fascia was incised longitudinally revealing the underlying sciatic  nerve which was carefully protected. The short external rotators of the hip  and hip capsule were released off their femoral neck insertion and tagged.  The hip was then posteriorly dislocated. Found to have significant grade 3  and 4 DJD with spurs noted. A femoral neck cut was then made 1.5 to 2 cm  above the lesser trochanter in the appropriate amount of anteversion and  abduction. Carefully placed retractors were placed around the acetabulum.  The degenerative labrum was removed. Acetabulum was found to have grade 3  and 4 DJD and chondromalacia as well. Sequential acetabular reamers were  then used to ream up to a 58 mm size in the appropriate amount of  anteversion, abduction inclination and then a 58 trial was placed. This gave  an excellent fit. This was then removed and a 58 mm Press-Fit acetabular  shell was hammered into position with an excellent press fit and in the  appropriate amount of anteversion, abduction and inclination. It was further  secured in place with two locking screws, one  in the 12 o'clock and one and  11 o'clock position each 20 mm in length. A 10 degrees polyethylene liner  was then placed with a 10 degrees lip in the posterior lateral position and  hammered into position. The proximal femur was then exposed. Sequential  axial reamers were used to ream up to a #4 size and broaches to a #4 size  and with a #4 broach in place with a plus 5 x  32 mm femoral head trial, the  hip was then reduced, taken through range of motion, found to be stable up  to 75-80 degrees of internal rotation in both neutral and 30 degrees of  adduction and also stable in abduction external rotation. An intraoperative  x-ray was also obtained with the trial and place, found to be excellent  position of the components. At this point then the femoral trial was  removed, a cement plug was measured and #3 cement plug was  used and placed  in position. The femoral canal was then jet lavage irrigated with 3 liters  of saline solution and then filled with cement and then the actual number 4  Summit stem was hammered into position with a 10 mm distal tip centralizer  and hammered in position with an excellent fit with excess cement being  removed from around the edges. After the cement hardened then the 32 mm x +5  femoral head was hammered into position on the femoral neck with an  excellent Morse taper fit. The hip reduced, taken through full range of  motion, found to be stable. Leg lengths equalized. At this point it was felt  that all components were excellent size and stability. The wound was further  irrigated and short external rotators of the hip and hip capsule were  released off the cord, reattached to their femoral neck insertion through  two drills on the greater trochanter. Iliotibial band and gluteus maximus  fascia was closed with a #1 Ethilon suture. Subcutaneous tissues closed with  0 and 2-0 Vicryl and skin closed with skin staples. Sterile dressings were  applied. Hip abduction pillow applied. The patient turned supine to check  for leg lengths, they were equal, rotation equal, pulses 2+ and symmetric.  He was awakened, extubated, taken to recovery in stable condition. Needle,  sponge counts correct x2 end of the case.      Robert A. Thurston Hole, M.D.  Electronically Signed     RAW/MEDQ  D:  05/26/2005  T:  05/26/2005  Job:  [id number]

## 2010-08-30 NOTE — Op Note (Signed)
[name], [name]                           ACCOUNT NO.:  [id number]   MEDICAL RECORD NO.:  [id number]                   PATIENT TYPE:  INP   LOCATION:  5735                                 FACILITY:  [facility]   PHYSICIAN:  Althea Grimmer. Luther Parody, M.D.            DATE OF BIRTH:  1924-12-18   DATE OF PROCEDURE:  01/27/2003  DATE OF DISCHARGE:                                 OPERATIVE REPORT   PROCEDURE:  Video endoscopic retrograde cholangiopancreatography with  sphincterotomy and stone extraction.   INDICATIONS:  Occluded distal common bile duct on intraoperative  cholangiogram at the time of laparoscopic cholecystectomy this morning.   PREPARATION:  The patient is NPO.   PREPROCEDURE MEDICATION:  His throat was anesthetized with Cetacaine spray  and he received 50 mg of Demerol and 5 mg of Versed during the initial  phases of the procedure.  He also received 0.75 mg of Glucagon.   DESCRIPTION OF PROCEDURE:  The Olympus video duodenoscope was inserted via  the mouth and advanced through the upper esophageal sphincter with ease.  Intubation was then carried out into the stomach.  Due to a very J-shaped  stomach, there was considerable difficulty intubating the duodenum but this  was eventually accomplished.  The ampulla was visualized on lateral rotation  and pullback, and the ampulla was cannulated with the TriTome catheter pre-  loaded with the guidewire.  Several minimal injections were made in the  pancreatic duct without acinar filling before the CBD was cannulated.  When  this was accomplished, the guidewire was inserted up past the bifurcation  and a medium sphincterotomy was performed.  A balloon catheter exchange with  an 8 mm balloon was made.  The balloon was advanced up to the bifurcation of  the duct, inflated, and withdrawn through the sphincterotomy with ease.  Two  small stones were seen to enter the duodenum.  Following the final sweep, an  x-ray of the biliary  system was made and no further stones or debris were  appreciated, and the duct appeared to be draining well.  The patient  tolerated the procedure well.  Pulse, blood pressure, and oximetry testing  were stable throughout.  He will be observed in recovery and discharged back  to his hospital room if stable with a benign abdomen.   IMPRESSION:  Successful endoscopic retrograde cholangiopancreatography and  sphincterotomy with removal of two distal common bile duct stones, which  were quite small, and alleviation of common bile duct obstruction.   PLAN:  The patient will be observed on clear liquids and follow-up care as  per surgeons.                                               Althea Grimmer. Luther Parody, M.D.   PJS/MEDQ  D:  01/27/2003  T:  01/28/2003  Job:  [id number]   cc:   Jimmye Norman III, M.D.  [address], [address]  [place]  [place] [address]  Fax: [phone]

## 2010-08-30 NOTE — Discharge Summary (Signed)
[name], [name]                 ACCOUNT NO.:  [id number]   MEDICAL RECORD NO.:  [id number]          PATIENT TYPE:  INP   LOCATION:  2001                         FACILITY:  [facility]   PHYSICIAN:  Elana Alm. Thurston Hole, M.D. DATE OF BIRTH:  07-21-1924   DATE OF ADMISSION:  05/26/2005  DATE OF DISCHARGE:  05/30/2005                                 DISCHARGE SUMMARY   ADMISSION DIAGNOSES:  1.  End-stage degenerative joint disease, right hip.  2.  Coronary artery disease.  3.  Hypertension.  4.  Myocardial infarction x2.   DISCHARGE DIAGNOSES:  1.  End-stage degenerative joint disease, right hip.  2.  Coronary artery disease.  3.  Hypertension.  4.  Myocardial infarction x2.  5.  Chronic obstructive pulmonary disease.  6.  Congestive heart failure.  7.  Diabetes.  8.  Hypokalemia.   HISTORY OF PRESENT ILLNESS:  The patient is a 77 year old white male with a  history of end-stage DJD of his right hip. He was medically cleared for  surgery by Dr. Hetty Ely and by [facility]. It was medically  necessary for him to remain on Plavix throughout his preoperative, interop  and postoperative period due to his postoperative complications following  his other total hip. He understands all these risks as well as the benefits  for total hip replacement and is without question.   PROCEDURE:  On May 26, 2005, the patient underwent a right total hip  replacement by Dr. Thurston Hole. He tolerated the procedure well. Postoperative  day #1, the patient did well, his biggest complaint was hand pain, his INR  was 1.3, his hemoglobin was 9.7, he was metabolically stable. He was given a  500 mL bolus of normal saline due to his hypotension and his  antihypertensive medication was discontinued and he was transferred from ICU  to a telemetry unit. Diabetes team was consulted due to his elevated CBGs.  Postoperative day #2, hemoglobin was 7.7, he was given 2 units packed red  blood cells, he was  slightly hyponatremic as well as hypokalemic. Therefore  a potassium supplementation was given and incentive spirometry was  encouraged. Postoperative day 3, the patient's vital signs were stable, he  was afebrile, his hemoglobin was 9.4 post transfusion. His sodium had  normalized at 137, his potassium was still slightly low at 3.4. He was  transferred to 5000 in stable condition. Postoperative day 4, Tmax of 100,  surgical wound was well approximated. His hemoglobin was 9.2, sodium was  139, potassium was 3.7. His sugars were under fairly good control. He is  still max assist with physical therapy. He was admitted to rehab due to his  multiple medical issues as well as his need for aggressive physical therapy.  We will continue to follow him on rehab. He is discharged to rehab in stable  condition, weightbearing as tolerated, on a diabetic diet.   DISCHARGE MEDICATIONS:  1.  Altace 5 mg daily.  2.  Glimepiride 4 mg 1/2 tablet a day.  3.  Plavix 75 mg a day.  4.  Lipitor 40 mg a  day.  5.  Aspirin 81 mg a day.  6.  Coumadin per pharmacy protocol to maintain his INR between 2 and 3.      Kirstin Shepperson, P.A.      Robert A. Thurston Hole, M.D.  Electronically Signed    KS/MEDQ  D:  08/01/2005  T:  08/04/2005  Job:  [id number]

## 2010-08-30 NOTE — Consult Note (Signed)
[name], [name]                           ACCOUNT NO.:  [id number]   MEDICAL RECORD NO.:  [id number]                   PATIENT TYPE:  INP   LOCATION:  5735                                 FACILITY:  [facility]   PHYSICIAN:  Althea Grimmer. Luther Parody, M.D.            DATE OF BIRTH:  1924-09-04   DATE OF CONSULTATION:  01/27/2003  DATE OF DISCHARGE:                                   CONSULTATION   REFERRING PHYSICIAN:  Jimmye Norman, M.D.   HISTORY OF PRESENT ILLNESS:  [name] is a 77 year old male whom I am asked  to see by Dr. Lindie Spruce for an abnormal intraoperative cholangiogram at the time  of his laparoscopic cholecystectomy this morning.  He presented to the  hospital with clinical acute cholecystitis complaining of three to four days  of abdominal pain in the epigastrium and right upper quadrant.  Ultrasonogram showed a thickened gallbladder wall with sludge and stones and  pericholecystic fluid.  He was febrile with normal liver function tests.  He  was taken to the OR this morning where he had a laparoscopic  cholecystectomy.  Apparently, the gallbladder was quite dilated and near  necrotic and filled with milky fluid.  Some stones were milked back from the  cystic duct into the gallbladder for removal, however, it was felt that  probably some stones slipped into the common bile duct.  Intraoperative  cholangiogram showed no filling of the duodenum and it was suspected there  was either edema, spasm or stone debris at the distal common bile duct  occluding its drainage.  At the current time, the patient is postoperative.  He is some in abdominal pain, but not complaining particularly much.  He  says it is difficult to breath deep because of the abdominal pain.   PAST MEDICAL HISTORY:  1. Diabetes mellitus type 2.  2. Hypertension.   CURRENT MEDICATIONS:  1. Amaryl.  2. Altace.  3. Lipitor.  4. Aspirin.   ALLERGIES:  No known drug allergies.   PAST SURGICAL HISTORY:  Right  inguinal hernia repair multiple times.   FAMILY HISTORY:  Noncontributory.   SOCIAL HISTORY:  Married with attentive wife.  Nondrinker.   REVIEW OF SYMPTOMS:  GENERAL:  No weight loss or night sweats.  ENDOCRINE:  Positive for type 2 diabetes.  No known thyroid problems.  SKIN:  No rash or  pruritus.  HEENT:  No icterus or change in vision.  ENT:  No aphthous ulcers  or chronic sore throat.  RESPIRATORY:  Currently, mild short of breath.  CARDIOVASCULAR:  No chest pain, palpitations or history of valvular heart  disease.  GASTROINTESTINAL:  As above.  GENITOURINARY:  No dysuria or  hematuria.  The remainder of review of systems is negative.   PHYSICAL EXAMINATION:  GENERAL:  Well-developed, elderly male in mild  discomfort.  VITAL SIGNS:  Afebrile currently with a blood pressure of 134/66, pulse  95  and regular.  SKIN:  Normal.  HEENT:  Eyes anicteric.  Oropharynx unremarkable.  NECK:  Supple without thyromegaly, bruits or adenopathy.  CHEST:  Clear, although he does have some upper respiratory noise.  HEART:  Regular rate and rhythm without murmurs, rubs or gallops.  ABDOMEN:  Postsurgical mild distention.  There is no drainage from the  surgical scars.  The dressings are dry.  RECTAL:  Not performed.  EXTREMITIES:  Without clubbing, cyanosis or edema.   LABORATORY DATA AND X-RAY FINDINGS:  Hemoglobin 15.4, white count 9.3,  platelet count 169.  Electrolytes are normal with blood sugar 129.  Amylase  and lipase were normal.  Liver function tests with SGPT 50 and SGOT 59  postoperatively.  They were normal yesterday.  Urinalysis negative.  Fecal  occult blood testing negative.   IMPRESSION:  Elderly male, status post laparoscopic cholecystectomy with  apparent occlusion of the distal common bile duct likely from stone debris.   RECOMMENDATIONS:  Relatively urgent ERCP is probably indicated.  The  procedure is reviewed with the patient and his family in terms of technique   preparation and risk of complications including bleeding, perforation and  pancreatitis.  He agrees to proceed.  It will be scheduled later today.                                               Althea Grimmer. Luther Parody, M.D.    PJS/MEDQ  D:  01/27/2003  T:  01/27/2003  Job:  [id number]

## 2010-08-30 NOTE — Discharge Summary (Signed)
NAMEORIE, BAXENDALE                 ACCOUNT NO.:  [id number]   MEDICAL RECORD NO.:  [id number]          PATIENT TYPE:  OIB   LOCATION:  6523                         FACILITY:  [facility]   PHYSICIAN:  Arturo Morton. Riley Kill, M.D. [facility] OF BIRTH:  02-28-1925   DATE OF ADMISSION:  04/09/2004  DATE OF DISCHARGE:  04/10/2004                           DISCHARGE SUMMARY - REFERRING   PROCEDURES:  Coronary angiogram/stenting (non-DS) left circumflex artery on  December 27.   REASON FOR ADMISSION:  Please refer to dictated admission note.   LABORATORY DATA:  Normal CBC, electrolytes, BUN 11, creatinine 1.0.  At  discharge, CPK 38/1.3 (host second PCI).   Admission chest x-ray:  No acute disease.   HOSPITAL COURSE:  The patient presented for elective coronary angiography  following recent abnormal stress test scheduled for preop cardiac clearance.  The patient is scheduled to undergo total hip replacement.   Coronary angiography notable for 99% second obtuse marginal and 70%, 85%  distal right coronary artery disease.  No critical lesions noted in the left  anterior descending artery.  Left ventricular function was preserved.   Following review with Dr. Riley Kill, the patient underwent successful  percutaneous intervention (non-DS) with __________ of the 99% obtuse  marginal lesion.  She was 0% residual stenosis.  There were no noted  complications.   The patient was cleared for discharge the following morning in  hemodynamically stable condition.  No incision site complications noted.  New medication adjustments are Plavix (x1 month) and Toprol XL daily.   Of note, the patient has been instructed to continue on regular aspirin  through scheduled hip replacement surgery in early February.   DISCHARGE MEDICATIONS:  1.  Plavix 75 mg daily (x1 month).  2.  Coated aspirin 325 mg daily.  3.  Toprol  XL 25 mg daily.  4.  Altace 5 mg daily.  5.  Amaryl 2 mg daily.  6.  Lipitor 5 mg daily.  7.   Nitrostat 0.4 mg p.r.n.   DISCHARGE INSTRUCTIONS:  No heavy lifting/driving x2 days, __________.  Call  the office if there is any swelling/bleeding in the groin.   FOLLOWUP:  Follow up with Dr. Arvilla Meres on Monday, January 9, at 3  p.m.   DISCHARGE DIAGNOSES:  1.  Coronary artery disease progression.      1.  Status post stenting (non-DS) 99% second obtuse marginal on December          27.      2.  Residual moderate distal right coronary artery disease.      3.  Preserved left ventricular function.  2.  Type 2 diabetes mellitus.  3.  Dyslipidemia.  4.  Hypertension.  5.  Bilateral hip degenerative joint disease.  6.  Remote tobacco.       GS/MEDQ  D:  04/10/2004  T:  04/10/2004  Job:  [id number]   cc:   Molly Maduro A. Thurston Hole, M.D.  [address]  [place] [address]  Fax: [phone]

## 2010-08-30 NOTE — Consult Note (Signed)
NAMEJACORIAN, GOLASZEWSKI NO.:  [id number]   MEDICAL RECORD NO.:  [id number]          PATIENT TYPE:  INP   LOCATION:  2550                         FACILITY:  [facility]   PHYSICIAN:  Willa Rough, M.D.     DATE OF BIRTH:  1924/10/02   DATE OF CONSULTATION:  05/26/2005  DATE OF DISCHARGE:                                   CONSULTATION   Mr. Kaspar has had right total hip surgery by Elana Alm. Thurston Hole, M.D. today.  He has been followed carefully by Arvilla Meres, M.D. [facility] of our group.  Mr. Benegas has done well recently.  In December of 2005, the patient underwent  cardiac catheterization and received PCI to the third marginal of the  circumflex with a bare metal stent.  This was done specifically so that he  would not have as long a term of Plavix requirement.  Ultimately, his Plavix  could be stopped so that he could then have his left hip surgery.  This was  done and he went home on Coumadin.  Unfortunately he had an acute MI and  returned to the hospital.  On June 03, 2004, with his acute MI, there  was thrombosis in his bare metal stent.  He received an aspiration  thrombectomy and he stabilized.  Plans were then to follow him along and he  has done well.  He is to stay on aspirin and Plavix indefinitely.  His  current surgery is done on aspirin and Plavix.   The patient has not been having any significant chest pain or shortness of  breath.  He was cleared for this procedure by Dr. Gala Romney in October of  2006.   ALLERGIES:  BIAXIN, PENICILLIN.   MEDICATIONS:  1.  Altace 5.  2.  Amaryl 2.  3.  Aspirin 81.  4.  Plavix 75.  5.  Lipitor 40.   PAST MEDICAL HISTORY:  See the complete list below.   SOCIAL HISTORY:  The patient lives in [place] with his wife.  He stopped  smoking 10 years ago.   FAMILY HISTORY:  There is no strong family history of coronary artery  disease.   REVIEW OF SYSTEMS:  He is doing well.  He is not having any significant GI  or  GU symptoms.  He does have hip pain for which he is having surgery.  Otherwise, his review of systems is negative.   PHYSICAL EXAMINATION:  VITAL SIGNS:  Temperature is 98.8, pulse 90,  respirations are 12-18.  He is sedated.  Blood pressure 127/58.  GENERAL:  He is early postoperative and really looks quite good.  He is  having some pain and he is receiving pain medicines.  HEENT:  No xanthelasma.  He has normal extraocular movements.  NECK:  No bruits.  There is no jugular venous distention.  HEART:  S1 with S2.  There are no clicks or significant murmurs.  ABDOMEN:  Limited in this setting, but he is stable.  EXTREMITIES:  His left leg is in appropriate braces post surgery and he has  no edema in  his right leg.  He is oriented despite his medications.   Chest x-ray postoperatively is pending.  His EKG postoperatively reveals no  acute changes.  He does have sinus rhythm.   LABORATORY DATA:  We know that several days ago his BUN was 12 with a  creatinine of 1.1 and his hemoglobin is currently 11.9.   PROBLEMS:  1.  Severe degenerative joint disease for which he has now had both hips      replaced.  2.  Hyperlipidemia.  3.  Hypertension.  4.  Diabetes.  5.  Coronary artery disease.  6.  History of good LV function historically.   We know that from December of 2005 the patient had other disease including  50% LAD lesion and an 80% ostial lesion of his second diagonal.  He also had  60% lesion in his right and a very distal 95% area of stenosis.  See the  discussion above concerning the circumflex.  This the area where he has a  drug-eluting stent that was treated with a thrombectomy.  The patient's  cardiac status is stable.  It is key that he has been kept on aspirin and  Plavix.  He appears stable.  Of course he has increased bleeding risks and  everyone is aware of this and this will have to be followed very careful.  No change in his cardiac regimen is recommended at this  time.           ______________________________  Willa Rough, M.D.     JK/MEDQ  D:  05/26/2005  T:  05/26/2005  Job:  [id number]   cc:   Molly Maduro A. Thurston Hole, M.D.  Fax: [phone]   Arvilla Meres, M.D. [facility]  [employer]  [address]  [place]  [place] [address]   Laurita Quint, M.D.  Fax: [phone]

## 2010-08-30 NOTE — H&P (Signed)
[name], [name] NO.:  [id number]   MEDICAL RECORD NO.:  [id number]          PATIENT TYPE:  INP   LOCATION:  1825                         FACILITY:  [facility]   PHYSICIAN:  [name], M.D.    DATE OF BIRTH:  [birth year]   DATE OF ADMISSION:  06/03/2004  DATE OF DISCHARGE:                                HISTORY & PHYSICAL   HISTORY OF PRESENT ILLNESS:  The patient is a 77 year old white male who  presented to the emergency room complaining of chest discomfort.  Dr. Pricilla [name] initially assessed the patient, who felt that he was having an acute  inferolateral myocardial infarction.  Apparently some time this morning  after breakfast at approximately 11 a.m., he began having right-sided chest  discomfort while walking.  He felt very weak.  His discomfort was a 5/10 in  intensity and elevated with three nitroglycerin.  Thus he came to the  emergency room.  An electrocardiogram showed inferolateral ST-segment  elevation.   ALLERGIES:  PENICILLIN, AND BIAXIN  causes nausea and vomiting.   MEDICATIONS:  1.  Based on his discharge from May 28, 2004, include Altace 5 mg      daily.  2.  Amaryl 2 mg daily.  3.  Lipitor 5 mg q.h.s.  4.  Toprol XL 50 mg daily.  5.  Oxycodone 5 mg q.4-6h. p.r.n.  6.  Tylenol p.r.n.  7.  Coumadin, unknown dosage.   PAST MEDICAL/SURGICAL HISTORY:  1.  Notable for hypertension  since 2001.  2.  Diabetes type 2 since 2001.  3.  Hyperlipidemia since 1998.  4.  Status post cholecystectomy.  5.  He underwent a preoperative Adenosine Cardiolite on March 25, 2004.      This showed an ejection fraction of 50%, inferolateral scar with peri-      infarction ischemia.  It was felt that he should undergo a cardiac      catheterization.  This was performed on April 09, 2004, and showed an      ejection fraction of 55%, a 50% mid-LAD, an 80% diagonal-II, a 99% OM-      III, a 60% RCA, a 60%/70%/90% distal RCA.  He underwent a  Guidant      Monovision stenting to his circumflex.  This was a non-drug-eluting      stent, and it was felt that he should maintain Plavix and aspirin for      one month, and then be able to have his hip surgery.  Hip surgery was      performed on May 20, 2004, by Dr. Elana Alm. [name], secondary to      severe degenerative joint disease.  He was briefly in the [facility] for      recovery, and was discharged home on Coumadin for anticoagulation.  His      wife states that his PT, INR was drawn on Friday, but she does not know      what the INR level was.   SOCIAL HISTORY:  He resides in [place] with his wife.  He  is a retired  Therapist, occupational.  He quit smoking in 1968.  Prior to that one pack  per day.  He denies any alcohol or drugs.   FAMILY HISTORY:  Noncontributory at this time.   REVIEW OF SYSTEMS:  Unobtainable except as above.   PHYSICAL EXAMINATION:  VITAL SIGNS:  Blood pressure 127/70, pulse 77,  respirations 16, 91% saturation on 2 L.  HEENT:  Dr. Tenny [name] examined the patient and felt that the HEENT was  normocephalic and atraumatic.  Pupils equal, round, reactive to light and  accommodation.  NECK:  Without any obvious signs of jugular venous distention.  HEART:  A regular rate and rhythm.  Normal S1, S2.  Positive S4.  No  murmurs.  LUNGS:  Sounds were clear to auscultation anterolaterally.  ABDOMEN:  Unremarkable.  EXTREMITIES:  Had 2+ DP's without edema.   LABORATORY DATA:  Available at this time are an H&H of 12.1/34.9, normal  indices, platelets 570, WBC's 13.8.   Again, an electrocardiogram shows normal sinus rhythm, normal axis, rate of  76, normal intervals.  He has inferolateral ST-segment elevation of 2 to 3  mm and ST-segment depression in V1 through V3, which is new since his last  electrocardiogram on May 20, 2004.   IMPRESSION:  1.  Acute inferolateral myocardial infarction with recent stenting to the      obtuse marginal branch on  April 09, 2004, with a non-drug-eluting      stent.  2.  Status post left hip surgery, currently on anticoagulation      postoperatively.  3.  History as previously described above.   DISPOSITION:  The patient is emergently taken to the cardiac catheterization  laboratory for further evaluation.  Orthopedics covering for Dr. Elana Alm.  [name] will see the patient to assist with orthopedic issues during this  hospitalization, including pain, anticoagulation and mobility instructions.  Labs are pending at this time.      EW/MEDQ  D:  06/03/2004  T:  06/03/2004  Job:  [id number]   cc:   [name], M.D. [name]   [name], M.D.  [address] [place]  [place] [address]  Fax: [phone]   [name], M.D.  [address]  [place] [address]  Fax: [phone]

## 2010-09-10 ENCOUNTER — Other Ambulatory Visit: Payer: Self-pay | Admitting: Pulmonary Disease

## 2010-09-10 MED ORDER — BUDESONIDE 0.5 MG/2ML IN SUSP: RESPIRATORY_TRACT | Status: DC

## 2010-09-10 MED ORDER — IPRATROPIUM-ALBUTEROL 0.5-2.5 (3) MG/3ML IN SOLN: RESPIRATORY_TRACT | Status: DC

## 2010-09-11 ENCOUNTER — Telehealth: Payer: Self-pay | Admitting: Pulmonary Disease

## 2010-09-11 NOTE — Telephone Encounter (Signed)
Verbal refill approved by phone with nancy at [facility] gave 6 refills

## 2010-09-11 NOTE — Telephone Encounter (Signed)
Daughter in law aware verbal refill given to [facility]

## 2010-09-18 ENCOUNTER — Ambulatory Visit (INDEPENDENT_AMBULATORY_CARE_PROVIDER_SITE_OTHER): Payer: Medicare Other | Admitting: Cardiovascular Disease

## 2010-09-18 ENCOUNTER — Encounter: Payer: Self-pay | Admitting: Cardiovascular Disease

## 2010-09-18 DIAGNOSIS — F068 Other specified mental disorders due to known physiological condition: Secondary | ICD-10-CM

## 2010-09-18 DIAGNOSIS — E119 Type 2 diabetes mellitus without complications: Secondary | ICD-10-CM

## 2010-09-18 DIAGNOSIS — I10 Essential (primary) hypertension: Secondary | ICD-10-CM

## 2010-09-18 DIAGNOSIS — I2510 Atherosclerotic heart disease of native coronary artery without angina pectoris: Secondary | ICD-10-CM

## 2010-09-18 DIAGNOSIS — E7800 Pure hypercholesterolemia, unspecified: Secondary | ICD-10-CM

## 2010-09-18 DIAGNOSIS — E785 Hyperlipidemia, unspecified: Secondary | ICD-10-CM

## 2010-09-18 DIAGNOSIS — J438 Other emphysema: Secondary | ICD-10-CM

## 2010-09-18 NOTE — Assessment & Plan Note (Signed)
Would continue his current statin with aggressive medical management, LDL goal less than 70. He is currently at goal.

## 2010-09-18 NOTE — Assessment & Plan Note (Signed)
We have encouraged continued exercise, careful diet management in an effort to lose weight. 

## 2010-09-18 NOTE — Patient Instructions (Signed)
You are doing well. No medication changes were made. Please call us if you have new issues that need to be addressed before your next appt.  We will call you for a follow up Appt. In 6 months  

## 2010-09-18 NOTE — Progress Notes (Signed)
   Patient ID: [name], male    DOB: 04-01-1925, 77 y.o.   MRN: [id number]  HPI Comments:  77 year old male with a history of coronary artery disease status post stenting of the third marginal with a nondrug-eluting stent in September 2005, complicated by stent thrombosis after discontinuation of his Plavix resulting in acute inferior and posterior myocardial infarction followed by repeat stenting with DES, long h/o COPD with h/o  COPD exacerbation episodes, also with HTN, Diabetes,  hyperlipidemia, bradycardia requiring discontinuation of his beta-blocker, bilateral hip replacement, and obstructive sleep apnea with noncompliance to CPAP, Who presents for routine followup.  He reports that overall he is feeling well. His daughter presents with him today and reports that he has continued chronic shortness of breath. The patient denies any problems including no chest pain, shortness of breath, edema. The daughter reports that he takes off his oxygen and walks around the house and goes too far without support from the oxygen. It appears that sometimes he has trouble off the oxygen and is very short of breath. He denies any cough or sputum production. He has been using a nebulizer machine at home and he believes this is better than his inhalers.     Previous EKG shows normal sinus rhythm with rate 61 beats per minute, no significant ST or T wave changes.        Review of Systems  Constitutional: Negative.   HENT: Negative.   Eyes: Negative.   Respiratory: Positive for cough and shortness of breath.   Cardiovascular: Negative.   Gastrointestinal: Negative.   Musculoskeletal: Negative.   Skin: Negative.   Neurological: Negative.   Hematological: Negative.   Psychiatric/Behavioral: Negative.   All other systems reviewed and are negative.    BP 128/72  Pulse 61  Ht 5\' 10"  (1.778 m)  Wt 180 lb 1.9 oz (81.702 kg)  BMI 25.84 kg/m2  SpO2 85% Oxygen level at rest with supplemental O2 is  98% With ambulation oxygen stays at 96% while on supplemental oxygen With exertion with no oxygen, his level dropped to 85% with no talking.  Physical Exam  Nursing note and vitals reviewed. Constitutional: He is oriented to person, place, and time. He appears well-developed and well-nourished.  HENT:  Head: Normocephalic.  Nose: Nose normal.  Mouth/Throat: Oropharynx is clear and moist.  Eyes: Conjunctivae are normal. Pupils are equal, round, and reactive to light.  Neck: Normal range of motion. Neck supple. No JVD present.  Cardiovascular: Normal rate, regular rhythm, S1 normal, S2 normal, normal heart sounds and intact distal pulses.  Exam reveals no gallop and no friction rub.   No murmur heard. Pulmonary/Chest: Effort normal. No respiratory distress. He has decreased breath sounds. He has no wheezes. He has no rales. He exhibits no tenderness.  Abdominal: Soft. Bowel sounds are normal. He exhibits no distension. There is no tenderness.  Musculoskeletal: Normal range of motion. He exhibits no edema and no tenderness.  Lymphadenopathy:    He has no cervical adenopathy.  Neurological: He is alert and oriented to person, place, and time. Coordination normal.  Skin: Skin is warm and dry. No rash noted. No erythema.  Psychiatric: He has a normal mood and affect. His behavior is normal. Judgment and thought content normal.           Assessment and Plan

## 2010-09-18 NOTE — Assessment & Plan Note (Signed)
Currently with no symptoms of angina. No further workup at this time. Continue current medication regimen. 

## 2010-09-18 NOTE — Assessment & Plan Note (Signed)
Blood pressure is well controlled on today's visit. No changes made to the medications. 

## 2010-09-18 NOTE — Assessment & Plan Note (Signed)
He appears to be stable in terms of his COPD. We have tried to get him strong encouragement to wear his oxygen even around the house as he does desaturate quickly with walking to 85%. His daughter reports that he struggles off oxygen and it is worse when he talks and exerts himself at the same time.

## 2010-09-19 ENCOUNTER — Ambulatory Visit: Payer: BLUE CROSS/BLUE SHIELD | Admitting: Internal Medicine

## 2010-10-07 ENCOUNTER — Encounter: Payer: Self-pay | Admitting: Pulmonary Disease

## 2010-10-08 ENCOUNTER — Encounter: Payer: Self-pay | Admitting: Pulmonary Disease

## 2010-10-08 ENCOUNTER — Ambulatory Visit (INDEPENDENT_AMBULATORY_CARE_PROVIDER_SITE_OTHER): Payer: Medicare Other | Admitting: Pulmonary Disease

## 2010-10-08 DIAGNOSIS — J438 Other emphysema: Secondary | ICD-10-CM

## 2010-10-08 DIAGNOSIS — J9610 Chronic respiratory failure, unspecified whether with hypoxia or hypercapnia: Secondary | ICD-10-CM

## 2010-10-08 NOTE — Assessment & Plan Note (Signed)
The pt is doing as well as can be expected with respect to his emphysema.  He has not had a recent infection or flareup, and is staying somewhat active.  He feels his exertional tolerance is at his usual baseline.  Will continue current meds, and will see him back in 4mos.

## 2010-10-08 NOTE — Patient Instructions (Signed)
No change in medications.  Stay on neb treatments regularly Stay as active as possible. followup with me in 4mos, but call if having issues.

## 2010-10-08 NOTE — Progress Notes (Signed)
  Subjective:    Patient ID: [name], male    DOB: 1924-09-10, 77 y.o.   MRN: [id number]  HPI The pt comes in today for f/u of his known severe emphysema with chronic RF.  He is staying on his meds and oxygen, and staying somewhat active.  He feels his exertional tolerance is near his usual baseline.  He has not had a recent infection or pulmonary flareup.  Denies cough or congestion, no purulent mucus.    Review of Systems  Constitutional: Negative for fever and unexpected weight change.  HENT: Negative for ear pain, nosebleeds, congestion, sore throat, rhinorrhea, sneezing, trouble swallowing, dental problem, postnasal drip and sinus pressure.   Eyes: Negative for redness and itching.  Respiratory: Positive for cough and shortness of breath. Negative for chest tightness and wheezing.   Cardiovascular: Negative for palpitations and leg swelling.  Gastrointestinal: Negative for nausea and vomiting.  Genitourinary: Positive for dysuria.  Musculoskeletal: Negative for joint swelling.  Skin: Negative for rash.  Neurological: Negative for headaches.  Hematological: Bruises/bleeds easily.  Psychiatric/Behavioral: Negative for dysphoric mood. The patient is not nervous/anxious.        Objective:   Physical Exam Wd male in nad Chest with mild decrease in bs, no wheezing noted.  Mild rhonchi right base Cor with rrr LE without edema, no cyanosis noted. Alert and oriented, moves all 4        Assessment & Plan:

## 2010-10-25 ENCOUNTER — Other Ambulatory Visit: Payer: Self-pay | Admitting: *Deleted

## 2010-10-25 NOTE — Telephone Encounter (Signed)
Received faxed refill request from pharmacy but PCP is listed as Dr. Debby Bud.  Please advise.  Form in your IN box.

## 2010-10-25 NOTE — Telephone Encounter (Signed)
Formerly Dr Lorenza Chick but has switched to Dr Debby Bud Please inform pharmacy to send request to [facility]

## 2010-10-28 ENCOUNTER — Other Ambulatory Visit: Payer: Self-pay | Admitting: *Deleted

## 2010-10-28 NOTE — Telephone Encounter (Signed)
Forwarded refill for Aricept to Dr.Norins, and advised pharmacy

## 2010-10-28 NOTE — Telephone Encounter (Signed)
Please inform the pharmacy

## 2010-10-29 ENCOUNTER — Other Ambulatory Visit: Payer: Self-pay | Admitting: *Deleted

## 2010-10-29 MED ORDER — DONEPEZIL HCL 10 MG PO TABS: 10.0000 mg | ORAL_TABLET | Freq: Every day | ORAL | Status: DC

## 2010-10-29 NOTE — Telephone Encounter (Signed)
Received faxed refill request please advise.  Fax in your IN box.

## 2010-11-05 ENCOUNTER — Telehealth: Payer: Self-pay | Admitting: Pulmonary Disease

## 2010-11-05 NOTE — Telephone Encounter (Signed)
D-in-law calling -HH nurse She hears wheezes & crackles, vital sings ok, O2 satn Ok Nothing to suggest geart failure I asked her to call back at 8A & perhaps be worked in for [employer]

## 2010-11-06 ENCOUNTER — Ambulatory Visit (INDEPENDENT_AMBULATORY_CARE_PROVIDER_SITE_OTHER)
Admission: RE | Admit: 2010-11-06 | Discharge: 2010-11-06 | Disposition: A | Discharge status: Home / Self Care | Payer: Medicare Other | Source: Ambulatory Visit | Attending: Adult Health | Admitting: Adult Health

## 2010-11-06 ENCOUNTER — Encounter: Payer: Self-pay | Admitting: Adult Health

## 2010-11-06 ENCOUNTER — Ambulatory Visit (INDEPENDENT_AMBULATORY_CARE_PROVIDER_SITE_OTHER): Payer: Medicare Other | Admitting: Adult Health

## 2010-11-06 VITALS — BP 116/60 | HR 66 | Temp 97.1°F | Ht 68.0 in | Wt 187.0 lb

## 2010-11-06 DIAGNOSIS — J438 Other emphysema: Secondary | ICD-10-CM

## 2010-11-06 MED ORDER — PREDNISONE 10 MG PO TABS: ORAL_TABLET | ORAL | Status: AC

## 2010-11-06 MED ORDER — LEVOFLOXACIN 500 MG PO TABS: 500.0000 mg | ORAL_TABLET | Freq: Every day | ORAL | Status: DC

## 2010-11-06 NOTE — Patient Instructions (Signed)
Levaquin 500mg  daily for 7 days  Prednisone taper over next week.  Mcuinex DM Twice daily  As needed  Cough/congestion  Fluids and rest.  I will call with chest xray results.  Please contact office for sooner follow up if symptoms do not improve or worsen or seek emergency care  follow up Dr. Shelle Iron as planned and As needed

## 2010-11-06 NOTE — Progress Notes (Signed)
  Subjective:    Patient ID: [name], male    DOB: 1924/07/30, 77 y.o.   MRN: [id number]  HPI 77 yo male with  known severe emphysema with chronic RF.   11/06/2010 Acute OV  Pt presents for an acute work in visit. Complains of increased SOB, wheezing, prod cough with white/yellow mucus, sore throat for last 1 week, worse yesterday. OTC not helping. No hemoptysis . No chest pain or edema.    Review of Systems Constitutional:   No  weight loss, night sweats,  Fevers, chills, fatigue, or  lassitude.  HEENT:   No headaches,  Difficulty swallowing,  Tooth/dental problems, or  Sore throat,                No sneezing, itching, ear ache, nasal congestion, post nasal drip,   CV:  No chest pain,  Orthopnea, PND, swelling in lower extremities, anasarca, dizziness, palpitations, syncope.   GI  No heartburn, indigestion, abdominal pain, nausea, vomiting, diarrhea, change in bowel habits, loss of appetite, bloody stools.   Resp:    No coughing up of blood.    No chest wall deformity  Skin: no rash or lesions.  GU: no dysuria, change in color of urine, no urgency or frequency.  No flank pain, no hematuria   MS:  No joint pain or swelling.  No decreased range of motion   Psych:  No change in mood or affect. No depression or anxiety.             Objective:   Physical Exam GEN: A/Ox3; pleasant , NAD, elderly   HEENT:  [place]/AT,  EACs-clear, TMs-wnl, NOSE-clear, THROAT-clear, no lesions, no postnasal drip or exudate noted.   NECK:  Supple w/ fair ROM; no JVD; normal carotid impulses w/o bruits; no thyromegaly or nodules palpated; no lymphadenopathy.  RESP  Coarse BS w/ exp wheezing .no accessory muscle use, no dullness to percussion  CARD:  RRR, no m/r/g  , no peripheral edema, pulses intact, no cyanosis or clubbing.  GI:   Soft & nt; nml bowel sounds; no organomegaly or masses detected.  Musco: Warm bil, no deformities or joint swelling noted.   Neuro: alert, no focal deficits  noted.    Skin: Warm, no lesions or rashes          Assessment & Plan:

## 2010-11-06 NOTE — Assessment & Plan Note (Signed)
COPD flare   Plan:  Levaquin 500mg  daily for 7 days  Prednisone taper over next week.  Mcuinex DM Twice daily  As needed  Cough/congestion  Fluids and rest.  I will call with chest xray results.  Please contact office for sooner follow up if symptoms do not improve or worsen or seek emergency care  follow up Dr. Shelle Iron as planned and As needed

## 2010-11-07 NOTE — Progress Notes (Signed)
Quick Note:  Spoke with [name]; aware of recs and to continue to follow up with [facility] as requested and to call if needed. ______

## 2010-11-12 NOTE — Telephone Encounter (Signed)
Noted  

## 2010-11-14 ENCOUNTER — Ambulatory Visit (INDEPENDENT_AMBULATORY_CARE_PROVIDER_SITE_OTHER): Payer: Medicare Other | Admitting: Internal Medicine

## 2010-11-14 ENCOUNTER — Encounter: Payer: Self-pay | Admitting: Internal Medicine

## 2010-11-14 DIAGNOSIS — F068 Other specified mental disorders due to known physiological condition: Secondary | ICD-10-CM

## 2010-11-14 DIAGNOSIS — E785 Hyperlipidemia, unspecified: Secondary | ICD-10-CM

## 2010-11-14 DIAGNOSIS — E119 Type 2 diabetes mellitus without complications: Secondary | ICD-10-CM

## 2010-11-14 DIAGNOSIS — I10 Essential (primary) hypertension: Secondary | ICD-10-CM

## 2010-11-14 DIAGNOSIS — J438 Other emphysema: Secondary | ICD-10-CM

## 2010-11-14 NOTE — Assessment & Plan Note (Signed)
Lab Results  Component Value Date   HGBA1C 7.3* 07/30/2010    Doing ok on present regimen Will get repeat A1C in October.

## 2010-11-14 NOTE — Progress Notes (Signed)
  Subjective:    Patient ID: [name], male    DOB: 1924/05/04, 77 y.o.   MRN: [id number]  HPI Mr. Pam presents for routine follow-up. IN the interval since last visit he was seen by Tammy Parrett 7/25 diagnosed with a flare of COPD treated with levaquin 500mg  qd x 7, prednisonse burst and taper. CXR was negative for acute infection.  He has otherwise been doing OK.   He has progressive dementia and memory loss per daughter. Fortunately he is not unhappy and has no behavioral problems. He has wandered a little bit to the out buildings at home without oxygen.   I have reviewed the patient's medical history in detail and updated the computerized patient record.    Review of Systems Review of Systems  Constitutional:  Negative for fever, chills, activity change and unexpected weight change.  HEENT:  Negative for hearing loss, ear pain, congestion, neck stiffness and postnasal drip. Negative for sore throat or swallowing problems. Negative for dental complaints.   Eyes: Negative for vision loss or change in visual acuity.  Respiratory: Negative for chest tightness and wheezing.   Cardiovascular: Negative for chest pain and palpitation. No decreased exercise tolerance Gastrointestinal: No change in bowel habit. No bloating or gas. No reflux or indigestion Genitourinary: Negative for urgency, frequency, flank pain and difficulty urinating.  Musculoskeletal: Negative for myalgias, back pain, arthralgias and gait problem.  Neurological: Negative for dizziness, tremors, weakness and headaches.  Hematological: Negative for adenopathy.  Psychiatric/Behavioral: Negative for behavioral problems and dysphoric mood.       Objective:   Physical Exam Vitals noted - BP ok if not a little low, O2 sat good on 2 l oxygen Gen'l - WNWD man in no distress Pul - no increased work of breathing while on oxygen. No wheezing Cor - RRR Psych- unable to give day or date but gets the year. No able to relate  current events. No formal testing otherwise done        Assessment & Plan:

## 2010-11-14 NOTE — Patient Instructions (Signed)
Doing pretty well. Plan is to increase the aricept to 23mg  daily. Continue medications as listed. Use Oxygen 24/7.  Return October 17th for routine lab: cholesterol and diabetes.  Call for any problems otherwise return in 3 months.

## 2010-11-14 NOTE — Assessment & Plan Note (Signed)
Last lab in September '11 - good control then  Plan - follow-up lab in October

## 2010-11-14 NOTE — Assessment & Plan Note (Signed)
BP Readings from Last 3 Encounters:  11/14/10 106/58  11/06/10 116/60  10/08/10 110/64   Controlled on present medicare.

## 2010-11-14 NOTE — Assessment & Plan Note (Signed)
Patient with progressive dementia but still manageable at home  Plan - increase aricept to 23 mg           Continue namenda

## 2010-11-14 NOTE — Assessment & Plan Note (Signed)
pateint with recent flare of COPD now completed therapy. He is advised to continue with O2 24/7.

## 2010-11-20 ENCOUNTER — Telehealth: Payer: Self-pay | Admitting: *Deleted

## 2010-11-20 MED ORDER — POTASSIUM CHLORIDE CRYS ER 20 MEQ PO TBCR: 20.0000 meq | EXTENDED_RELEASE_TABLET | Freq: Every day | ORAL | Status: DC

## 2010-11-20 NOTE — Telephone Encounter (Signed)
Request refill for Klor-Con 

## 2010-11-25 ENCOUNTER — Other Ambulatory Visit: Payer: Self-pay | Admitting: Family Medicine

## 2010-12-22 ENCOUNTER — Other Ambulatory Visit: Payer: Self-pay | Admitting: Internal Medicine

## 2010-12-30 ENCOUNTER — Telehealth: Payer: Self-pay | Admitting: Internal Medicine

## 2010-12-30 NOTE — Telephone Encounter (Signed)
12/29/10- On Call- Daughter called - increased cough, weakness, dyspnea. Asked antibiotic/ prednisone-"usually helps" Plan- sent Zpak, Pred 20 mg daily x 5 days, Call office first of week.

## 2011-01-21 ENCOUNTER — Telehealth: Payer: Self-pay | Admitting: Internal Medicine

## 2011-01-21 DIAGNOSIS — J441 Chronic obstructive pulmonary disease with (acute) exacerbation: Secondary | ICD-10-CM

## 2011-01-21 MED ORDER — DOXYCYCLINE HYCLATE 50 MG PO CAPS: 100.0000 mg | ORAL_CAPSULE | Freq: Two times a day (BID) | ORAL | Status: DC

## 2011-01-21 MED ORDER — PREDNISONE 10 MG PO TABS: ORAL_TABLET | ORAL | Status: AC

## 2011-01-21 NOTE — Telephone Encounter (Signed)
dtr in law called, Wants something for cough. On More detailed hx admits to cough, and worsening dyspnea, incresed sputum volumed  Plan Doxy x 5 days pred burst

## 2011-01-24 ENCOUNTER — Ambulatory Visit (INDEPENDENT_AMBULATORY_CARE_PROVIDER_SITE_OTHER): Payer: Medicare Other | Admitting: Pulmonary Disease

## 2011-01-24 ENCOUNTER — Ambulatory Visit (INDEPENDENT_AMBULATORY_CARE_PROVIDER_SITE_OTHER)
Admission: RE | Admit: 2011-01-24 | Discharge: 2011-01-24 | Disposition: A | Discharge status: Home / Self Care | Payer: Medicare Other | Source: Ambulatory Visit | Attending: Pulmonary Disease | Admitting: Pulmonary Disease

## 2011-01-24 ENCOUNTER — Encounter: Payer: Self-pay | Admitting: Pulmonary Disease

## 2011-01-24 DIAGNOSIS — R05 Cough: Secondary | ICD-10-CM | POA: Insufficient documentation

## 2011-01-24 DIAGNOSIS — Z23 Encounter for immunization: Secondary | ICD-10-CM

## 2011-01-24 DIAGNOSIS — J438 Other emphysema: Secondary | ICD-10-CM

## 2011-01-24 MED ORDER — HYDROCODONE-HOMATROPINE 5-1.5 MG/5ML PO SYRP: 5.0000 mL | ORAL_SOLUTION | Freq: Four times a day (QID) | ORAL | Status: DC | PRN

## 2011-01-24 NOTE — Assessment & Plan Note (Signed)
The patient is doing well from a COPD standpoint.  He has not had a recent acute exacerbation by history.  He is to continue on his current bronchodilator regimen.

## 2011-01-24 NOTE — Patient Instructions (Signed)
Take dexilant 60mg  one each am for next 10 days for possible reflux Take zyrtec 10mg  one each am for nasal drip Try hard candy in your mouth during the day to keep back of throat soothed. Can take cough medication at night to help with sleep. followup with me in 4 mos, cancel upcoming apptm in Oct.

## 2011-01-24 NOTE — Assessment & Plan Note (Signed)
The patient's cough sounds more upper airway in origin than lower.  He has not responded to 2 courses of antibiotics and prednisone taper.  I suspect it is due to postnasal drip and possible reflux, as well as a cyclical cough mechanism.  We'll treat for a few weeks for each of these entities, and see if the cough improves.  We'll check a chest x-ray for completeness.

## 2011-01-24 NOTE — Progress Notes (Signed)
Addended by: Ozella Almond R on: 01/24/2011 04:51 PM   Modules accepted: Orders

## 2011-01-24 NOTE — Progress Notes (Signed)
  Subjective:    Patient ID: [name], male    DOB: 08-12-1924, 77 y.o.   MRN: [id number]  HPI The patient comes in today for an acute sick visit.  He has had any significant cough over the last 4 weeks, and is primarily dry and hacking in nature.  He's been treated with 2 courses of antibiotics and a prednisone taper, and has not seen an appreciable difference in symptoms.  He is not seen worsening of his shortness of breath.  His cough is primarily dry, and he does have a tickle in his throat.  He denies significant postnasal drip or reflux symptoms.  His cough symptoms are suggestive of a cyclical mechanism.   Review of Systems  Constitutional: Negative for fever and unexpected weight change.  HENT: Positive for congestion and rhinorrhea. Negative for ear pain, nosebleeds, sore throat, sneezing, trouble swallowing, dental problem, postnasal drip and sinus pressure.   Eyes: Negative for redness and itching.  Respiratory: Positive for cough, shortness of breath and wheezing. Negative for chest tightness.   Cardiovascular: Negative for palpitations and leg swelling.  Gastrointestinal: Negative for nausea and vomiting.  Genitourinary: Negative for dysuria.  Musculoskeletal: Negative for joint swelling.  Skin: Negative for rash.  Neurological: Negative for headaches.  Hematological: Does not bruise/bleed easily.  Psychiatric/Behavioral: Negative for dysphoric mood. The patient is not nervous/anxious.        Objective:   Physical Exam Well developed male in no acute distress Nose without purulence or discharge noted Chest with mild decreased breath sounds, no wheezes or rhonchi Cardiac exam with regular rate and rhythm Lower extremities without edema, no cyanosis noted Alert and oriented, moves all 4 extremities.       Assessment & Plan:

## 2011-02-04 ENCOUNTER — Ambulatory Visit: Payer: Medicare Other | Admitting: Pulmonary Disease

## 2011-02-07 ENCOUNTER — Ambulatory Visit: Payer: Medicare Other | Admitting: Pulmonary Disease

## 2011-02-11 ENCOUNTER — Ambulatory Visit: Payer: Medicare Other | Admitting: Pulmonary Disease

## 2011-02-14 ENCOUNTER — Ambulatory Visit: Payer: Medicare Other | Admitting: Internal Medicine

## 2011-02-19 ENCOUNTER — Other Ambulatory Visit: Payer: Self-pay | Admitting: Pulmonary Disease

## 2011-02-20 ENCOUNTER — Encounter: Payer: Self-pay | Admitting: Adult Health

## 2011-02-20 ENCOUNTER — Ambulatory Visit (INDEPENDENT_AMBULATORY_CARE_PROVIDER_SITE_OTHER): Payer: Medicare Other | Admitting: Adult Health

## 2011-02-20 VITALS — BP 118/60 | HR 67 | Temp 96.7°F | Ht 68.0 in | Wt 180.0 lb

## 2011-02-20 DIAGNOSIS — J438 Other emphysema: Secondary | ICD-10-CM

## 2011-02-20 MED ORDER — DOXYCYCLINE HYCLATE 100 MG PO TABS: 100.0000 mg | ORAL_TABLET | Freq: Two times a day (BID) | ORAL | Status: AC

## 2011-02-20 MED ORDER — PREDNISONE 10 MG PO TABS: ORAL_TABLET | ORAL | Status: AC

## 2011-02-20 NOTE — Patient Instructions (Signed)
Doxycycline 100mg  Twice daily  For 7 days  Mucinex DM Twice daily  As needed  Cough/congestion  Prednisone taper over next week.  Fluids and rest  Please contact office for sooner follow up if symptoms do not improve or worsen or seek emergency care

## 2011-02-20 NOTE — Assessment & Plan Note (Signed)
COPD exacerbation   Plan ; Doxycycline 100mg  Twice daily  For 7 days  Mucinex DM Twice daily  As needed  Cough/congestion  Prednisone taper over next week.  Fluids and rest  Please contact office for sooner follow up if symptoms do not improve or worsen or seek emergency care

## 2011-02-20 NOTE — Progress Notes (Signed)
  Subjective:    Patient ID: [name], male    DOB: 1925-03-03, 77 y.o.   MRN: [id number]  HPI  77 yo male with  known severe emphysema with chronic RF.   11/06/10  Acute OV  Pt presents for an acute work in visit. Complains of increased SOB, wheezing, prod cough with white/yellow mucus, sore throat for last 1 week, worse yesterday. OTC not helping. No hemoptysis . No chest pain or edema.  >>  02/20/2011 Acute OV  Complains of increased SOB and cough- onset approx 2-3 days ago. Cough is non prod and bothers him more with exertion. No f/c/s. Over last week more dyspneic. Coughing up yellow thick mucus.  Taking mucinex without much help. Denies any hemoptysis, chest pain, abdominal pain, nausea, vomiting, or increased leg swelling. Patient's appetite is good. He is here with his daughter today who is worried. His symptoms will get worse over the weekend.   Review of Systems  Constitutional:   No  weight loss, night sweats,  Fevers, chills, fatigue, or  lassitude.  HEENT:   No headaches,  Difficulty swallowing,  Tooth/dental problems, or  Sore throat,                No sneezing, itching, ear ache,  ++nasal congestion, post nasal drip,   CV:  No chest pain,  Orthopnea, PND, swelling in lower extremities, anasarca, dizziness, palpitations, syncope.   GI  No heartburn, indigestion, abdominal pain, nausea, vomiting, diarrhea, change in bowel habits, loss of appetite, bloody stools.   Resp:    No coughing up of blood.    No chest wall deformity  Skin: no rash or lesions.  GU: no dysuria, change in color of urine, no urgency or frequency.  No flank pain, no hematuria   MS:  No joint pain or swelling.  No decreased range of motion   Psych:  No change in mood or affect. No depression or anxiety.             Objective:   Physical Exam  GEN: A/Ox3; pleasant , NAD, elderly -chronically ill appearing.   HEENT:  [place]/AT,  EACs-clear, TMs-wnl, NOSE-clear, THROAT-clear, no lesions, no  postnasal drip or exudate noted.   NECK:  Supple w/ fair ROM; no JVD; normal carotid impulses w/o bruits; no thyromegaly or nodules palpated; no lymphadenopathy.  RESP  Coarse BS w/ exp wheezing .no accessory muscle use, no dullness to percussion  CARD:  RRR, no m/r/g  , no peripheral edema, pulses intact, no cyanosis or clubbing.  GI:   Soft & nt; nml bowel sounds; no organomegaly or masses detected.  Musco: Warm bil, no deformities or joint swelling noted.   Neuro: alert, no focal deficits noted.    Skin: Warm, no lesions or rashes          Assessment & Plan:

## 2011-03-04 ENCOUNTER — Encounter: Payer: Self-pay | Admitting: Internal Medicine

## 2011-03-04 ENCOUNTER — Ambulatory Visit (INDEPENDENT_AMBULATORY_CARE_PROVIDER_SITE_OTHER): Payer: Medicare Other | Admitting: Internal Medicine

## 2011-03-04 DIAGNOSIS — E119 Type 2 diabetes mellitus without complications: Secondary | ICD-10-CM

## 2011-03-04 DIAGNOSIS — I10 Essential (primary) hypertension: Secondary | ICD-10-CM

## 2011-03-04 DIAGNOSIS — F068 Other specified mental disorders due to known physiological condition: Secondary | ICD-10-CM

## 2011-03-04 MED ORDER — SERTRALINE HCL 50 MG PO TABS: 50.0000 mg | ORAL_TABLET | Freq: Every day | ORAL | Status: DC

## 2011-03-04 NOTE — Progress Notes (Signed)
Subjective:    Patient ID: [name], male    DOB: September 21, 1924, 77 y.o.   MRN: [id number]  HPI [name] presents for follow-up. His daughter is the main historian. He never did increase aricept to 23 mg, infact he was at 5 mg dialy for several months and has been back up to 10 mg daily for 2 months. He is also taking Namenda.  His daughter is worried about depression. Reviewed the vegative signs of depression. She feels that he does have several signs.   Past Medical History  Diagnosis Date  . Bradycardia   . dementia   . mass, lung right lilum     patient had an abnormal CT chest 2+ years ago after infection: changes thougth to be a reaction and did clear.   . Shortness of breath   . weakness   . Chronic respiratory failure   . Obstructive sleep apnea (adult) (pediatric)   . allergi rhin, chronic   . Emphysema   . inguinal hernia, right   . Other specified disorder of skin   . COPD (chronic obstructive pulmonary disease)   . varicose vein     mild  . Hypertension   . coronary artery disease   . History of cardiac cath 06/03/04    acute info post MI, stent PICA  . History of cardiac cath 06/03/04    acute info post MI, stent PICA  . Asthma   . diabetes mell, type 2   . Pure hypercholesterolemia    Past Surgical History  Procedure Date  . Cholecystectomy 01/26/03-01/31/2003     [facility] ERCP, cholecystitis  . Stress/adenosine myoview 03/25/04    abnormal EF 50%  . Total hip arthroplasty 02/06    Left, anemia 2 units PRBC  . Adenosine myoview 08/13/05    EF 46%  . Total hip arthroplasty 05/31/05    right  . Ct angio chest 10/28/08    Neg PE, 3x3x2.2 cm right hilar mass  . Pet scan 11/02/08    Inflam/Infect Atelect RUL w/reactive lymphadenopathy  . Hernia repair 01/00    left recurrent   Family History  Problem Relation Age of Onset  . Cancer Sister     breast  . Diabetes Brother   . Hypertension Brother   . Hyperlipidemia Brother   . Parkinsonism Brother     . Cancer Sister    History   Social History  . Marital Status: Married    Spouse Name: N/A    Number of Children: 2  . Years of Education: 8   Occupational History  . 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     Social History Main Topics  . Smoking status: Former Smoker -- 1.0 packs/day for 25 years    Types: Cigarettes    Quit date: 08/12/1960  . Smokeless tobacco: Never Used  . Alcohol Use: No  . Drug Use: No  . Sexually Active: Not Currently   Other Topics Concern  . Not on file   Social History Narrative   Finished 8th grade. Married - 1946 -  . 1 son - '61; 1 dtr - '55;  3 grandchildren. 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  Lives alone with wife. End-of-Life Measures: no Cardiac resuscitation, no mechanical ventilation, no heroic or futile measures.        Review of  Systems System review is negative for any constitutional, cardiac, pulmonary, GI or neuro symptoms or complaints other than as described in the HPI.     Objective:   Physical Exam Vitals reviewed - BP just up a little Gen'l - elderly white man in no distress HEENT - C&S clear Cor - RRR Pulm - normal respirations Neuro - awake and alert, jovial, needs coaching with memory       Assessment & Plan:

## 2011-03-05 NOTE — Assessment & Plan Note (Signed)
BP Readings from Last 3 Encounters:  03/04/11 142/60  02/20/11 118/60  01/24/11 120/68   Good control - will continue present meds

## 2011-03-05 NOTE — Assessment & Plan Note (Signed)
Per daughter he is stable on present regimen of aricept 10 mg daily and namenda 10 mg bid. She feels he is depressed which contributes to his low energy and memory issues.  Plan - sertraline 50 mg qPM added to his regimen            Continue present dosing of aricept - little to gain with increase to 23 mg; continue namenda

## 2011-03-05 NOTE — Assessment & Plan Note (Signed)
Lab Results  Component Value Date   HGBA1C 7.3* 07/30/2010   Good control - no change in regimen

## 2011-03-30 ENCOUNTER — Other Ambulatory Visit: Payer: Self-pay | Admitting: Internal Medicine

## 2011-04-15 ENCOUNTER — Telehealth: Payer: Self-pay | Admitting: Internal Medicine

## 2011-04-15 NOTE — Telephone Encounter (Signed)
Daughter-in-law [name] called requesting we call in abx and prednisone "like we always give him" for increasing doe. He is comfortable at rest. There is no reported fever but coughing is worse.  Does not have a good concept of which meds to use prn though they are listed on our mar  I reviewed options for prn rx and rec he use them but if condition worsens and can't get breath at rest needs to go to er.  Offered to see pt in office 1/2 as a work in and let Dr Shelle Iron know that her expectation that we call in prn prednisone and abx was not our standard but that if Dr Shelle Iron wishes to given him this rx then he can certainly refill these meds prn s seeing the pt since he knows him well.

## 2011-04-16 ENCOUNTER — Telehealth: Payer: Self-pay | Admitting: Pulmonary Disease

## 2011-04-16 NOTE — Telephone Encounter (Signed)
Called [facility] spoke with alex who stated that the cmn was sent to [phone].  i informed alex that that fax machine is not one that i am aware of and gave her the triage fax number.  Alex stated that she will forward that info to yvette (who was not available to speak with) and i asked that she also relay that when [facility] signs the cmn we will fax it back.  Will hold in my inbox to sign off on the message once the cmn is received.

## 2011-04-17 NOTE — Telephone Encounter (Signed)
[facility] but was given prompt that they were closed will try back to get her to refax to triage fax line.

## 2011-04-17 NOTE — Telephone Encounter (Signed)
I spoke with yvette and advised her we have not received fax yet. She states she will refax it to [phone]. Will hold until fax is received

## 2011-04-17 NOTE — Telephone Encounter (Signed)
Paperwork received and given to [facility] to handle.

## 2011-04-17 NOTE — Telephone Encounter (Signed)
[name] returned call and asks that nurse call to confirm that fax was received in triage fax. [name]

## 2011-04-18 ENCOUNTER — Telehealth: Payer: Self-pay | Admitting: Pulmonary Disease

## 2011-04-18 MED ORDER — PREDNISONE 10 MG PO TABS: ORAL_TABLET | ORAL | Status: DC

## 2011-04-18 NOTE — Telephone Encounter (Signed)
[name] calling back, she is upset she hasn't heard anything from previous message.  Per Aundra Millet, I told patient issues would be addressed before the end of the day.  [name] then hung up the phone.

## 2011-04-18 NOTE — Telephone Encounter (Signed)
Ok to call in prednisone 10mg  Take 4 for each day for 2 days, then 3 each day for 2 days, then 2 for each day for 2 days, then one for each day for 2 days, then stop. Don't want to call in abx unless he is actively bringing up nasty mucus.  Let her know that prednisone is not a substitute for being evaluated.  If he is really having a hard time, don't depend on prednisone to make him better later.  She needs to get him to the ER if he is not doing well.

## 2011-04-18 NOTE — Telephone Encounter (Signed)
Spoke with pt's daughter and notified of recs per [facility]. Pt verbalized understanding and denied any questions. Rx for prednisone was sent to pharm.

## 2011-04-18 NOTE — Telephone Encounter (Signed)
Calling again in reference to previous message can be reached at [phone].[name]

## 2011-04-18 NOTE — Telephone Encounter (Signed)
[name] says that the pt began having sxs on mon., 04/14/11,,,croupy cough, scant mucus, increased sob, wheezing. Pt taking Mucinex twice daily to help thin out mucus. [name] is afraid that if he doewn't get something called in or isn't seen he will end up in the hospital. There are no openings with any provider today. Kc, pls advise. Allergies  Allergen Reactions  . Amoxicillin     REACTION: unspecified  . Clarithromycin     REACTION: UNSPECIFIED  . Penicillins     REACTION: unspecified

## 2011-04-18 NOTE — Telephone Encounter (Signed)
[name] returned the call. She will wait for megan to call back but asks that I let her know that the deadline is "today". [name]

## 2011-04-21 NOTE — Telephone Encounter (Signed)
Per Aundra Millet she gave this to alida to handle. Please advise if anything needs to be done alida,thanks

## 2011-04-22 ENCOUNTER — Other Ambulatory Visit: Payer: Self-pay | Admitting: Family Medicine

## 2011-04-22 NOTE — Telephone Encounter (Signed)
This paperwork has been faxed to [facility] to [facility] .Kandice Hams

## 2011-04-28 ENCOUNTER — Encounter (HOSPITAL_COMMUNITY): Payer: Self-pay | Admitting: Emergency Medicine

## 2011-04-28 ENCOUNTER — Emergency Department (HOSPITAL_COMMUNITY): Payer: Medicare Other

## 2011-04-28 ENCOUNTER — Emergency Department (HOSPITAL_COMMUNITY)
Admission: EM | Admit: 2011-04-28 | Discharge: 2011-04-29 | Disposition: A | Discharge status: Home / Self Care | Payer: Medicare Other | Attending: Emergency Medicine | Admitting: Emergency Medicine

## 2011-04-28 DIAGNOSIS — E7800 Pure hypercholesterolemia, unspecified: Secondary | ICD-10-CM | POA: Insufficient documentation

## 2011-04-28 DIAGNOSIS — E860 Dehydration: Secondary | ICD-10-CM | POA: Insufficient documentation

## 2011-04-28 DIAGNOSIS — Z7982 Long term (current) use of aspirin: Secondary | ICD-10-CM | POA: Insufficient documentation

## 2011-04-28 DIAGNOSIS — J438 Other emphysema: Secondary | ICD-10-CM | POA: Insufficient documentation

## 2011-04-28 DIAGNOSIS — R4182 Altered mental status, unspecified: Secondary | ICD-10-CM | POA: Insufficient documentation

## 2011-04-28 DIAGNOSIS — I2510 Atherosclerotic heart disease of native coronary artery without angina pectoris: Secondary | ICD-10-CM | POA: Insufficient documentation

## 2011-04-28 DIAGNOSIS — J40 Bronchitis, not specified as acute or chronic: Secondary | ICD-10-CM | POA: Insufficient documentation

## 2011-04-28 DIAGNOSIS — Z79899 Other long term (current) drug therapy: Secondary | ICD-10-CM | POA: Insufficient documentation

## 2011-04-28 DIAGNOSIS — B349 Viral infection, unspecified: Secondary | ICD-10-CM

## 2011-04-28 DIAGNOSIS — Z96649 Presence of unspecified artificial hip joint: Secondary | ICD-10-CM | POA: Insufficient documentation

## 2011-04-28 DIAGNOSIS — I10 Essential (primary) hypertension: Secondary | ICD-10-CM | POA: Insufficient documentation

## 2011-04-28 DIAGNOSIS — E119 Type 2 diabetes mellitus without complications: Secondary | ICD-10-CM | POA: Insufficient documentation

## 2011-04-28 DIAGNOSIS — G4733 Obstructive sleep apnea (adult) (pediatric): Secondary | ICD-10-CM | POA: Insufficient documentation

## 2011-04-28 DIAGNOSIS — J9610 Chronic respiratory failure, unspecified whether with hypoxia or hypercapnia: Secondary | ICD-10-CM | POA: Insufficient documentation

## 2011-04-28 DIAGNOSIS — R739 Hyperglycemia, unspecified: Secondary | ICD-10-CM

## 2011-04-28 DIAGNOSIS — F0390 Unspecified dementia without behavioral disturbance: Secondary | ICD-10-CM | POA: Insufficient documentation

## 2011-04-28 DIAGNOSIS — Z8674 Personal history of sudden cardiac arrest: Secondary | ICD-10-CM | POA: Insufficient documentation

## 2011-04-28 MED ORDER — ACETAMINOPHEN 500 MG PO TABS
1000.0000 mg | ORAL_TABLET | Freq: Once | ORAL | Status: AC
  Administered 2011-04-28: 1000 mg via ORAL
  Filled 2011-04-28: qty 2

## 2011-04-28 MED ORDER — SODIUM CHLORIDE 0.9 % IV BOLUS (SEPSIS)
500.0000 mL | INTRAVENOUS | Status: AC
  Administered 2011-04-28: 500 mL via INTRAVENOUS

## 2011-04-28 MED ORDER — MOXIFLOXACIN HCL IN NACL 400 MG/250ML IV SOLN
400.0000 mg | Freq: Once | INTRAVENOUS | Status: AC
  Administered 2011-04-29: 400 mg via INTRAVENOUS
  Filled 2011-04-28: qty 250

## 2011-04-28 MED ORDER — SODIUM CHLORIDE 0.9 % IV BOLUS (SEPSIS): 1000.0000 mL | Freq: Once | INTRAVENOUS | Status: DC

## 2011-04-28 NOTE — ED Notes (Signed)
Pt alert, nad, c/o fever, gen illness, onset unknown, pt/EMS poor historian, resp even unlabored, skin pwd, CBG reading 215

## 2011-04-28 NOTE — ED Notes (Signed)
Pt transported to Xray. 

## 2011-04-28 NOTE — ED Provider Notes (Signed)
History     CSN: [id number]  Arrival date & time 04/28/11  2244   First MD Initiated Contact with Patient 04/28/11 2312      Chief Complaint  Patient presents with  . Fatigue   Patient presents by EMS for fever or chills, onset unknown. Initial CBG 2:15. Initial temperature is 99.1. Extensive past medical history, see below.  Patient thinks his symptoms began this evening, but does have a history of dementia. Also, apparently has a history of right lung mass. Complains of shortness of breath to me but denies any other symptoms. He denies chest pain, nausea, fever. He states he has a mild nonproductive cough. Patient states he "has not been admitted to the hospital in years" also noted. History of COPD (Consider location/radiation/quality/duration/timing/severity/associated sxs/prior treatment) HPI  Past Medical History  Diagnosis Date  . Bradycardia   . dementia   . mass, lung right lilum     patient had an abnormal CT chest 2+ years ago after infection: changes thougth to be a reaction and did clear.   . Shortness of breath   . weakness   . Chronic respiratory failure   . Obstructive sleep apnea (adult) (pediatric)   . allergi rhin, chronic   . Emphysema   . inguinal hernia, right   . Other specified disorder of skin   . COPD (chronic obstructive pulmonary disease)   . varicose vein     mild  . Hypertension   . coronary artery disease   . History of cardiac cath 06/03/04    acute info post MI, stent PICA  . History of cardiac cath 06/03/04    acute info post MI, stent PICA  . Asthma   . diabetes mell, type 2   . Pure hypercholesterolemia     Past Surgical History  Procedure Date  . Cholecystectomy 01/26/03-01/31/2003     [facility] ERCP, cholecystitis  . Stress/adenosine myoview 03/25/04    abnormal EF 50%  . Total hip arthroplasty 02/06    Left, anemia 2 units PRBC  . Adenosine myoview 08/13/05    EF 46%  . Total hip arthroplasty 05/31/05    right  . Ct angio chest  10/28/08    Neg PE, 3x3x2.2 cm right hilar mass  . Pet scan 11/02/08    Inflam/Infect Atelect RUL w/reactive lymphadenopathy  . Hernia repair 01/00    left recurrent    Family History  Problem Relation Age of Onset  . Cancer Sister     breast  . Diabetes Brother   . Hypertension Brother   . Hyperlipidemia Brother   . Parkinsonism Brother   . Cancer Sister     History  Substance Use Topics  . Smoking status: Former Smoker -- 1.0 packs/day for 25 years    Types: Cigarettes    Quit date: 08/12/1960  . Smokeless tobacco: Never Used  . Alcohol Use: No      Review of Systems  All other systems reviewed and are negative.    Allergies  Amoxicillin; Clarithromycin; and Penicillins  Home Medications   Current Outpatient Rx  Name Route Sig Dispense Refill  . ACETAMINOPHEN 500 MG PO TABS Oral Take 1,000 mg by mouth at bedtime.      . ASPIRIN EC 81 MG PO TBEC Oral Take 162 mg by mouth daily.     . BUDESONIDE 0.5 MG/2ML IN SUSP  1 vial in nebulizer twice daily  DX code  ICD 492.8 60 mL 11  . CETIRIZINE HCL  10 MG PO TABS Oral Take 10 mg by mouth daily.      Marland Kitchen VITAMIN D 1000 UNITS PO TABS Oral Take 1,000 Units by mouth daily.      Marland Kitchen CLOPIDOGREL BISULFATE 75 MG PO TABS  TAKE 1 TABLET EVERY DAY 30 tablet 1  . DONEPEZIL HCL 10 MG PO TABS Oral Take 1 tablet (10 mg total) by mouth daily. 30 tablet 11  . FLUTICASONE PROPIONATE 50 MCG/ACT NA SUSP Nasal 2 sprays by Nasal route daily. 16 g 12  . FUROSEMIDE 20 MG PO TABS  TAKE 1 TABLET EVERY DAY 90 tablet 1  . GLIMEPIRIDE 4 MG PO TABS  TAKE 1 TABLET EVERY DAY 90 tablet 3  . HYDROCODONE-HOMATROPINE 5-1.5 MG/5ML PO SYRP Oral Take 5 mLs by mouth every 6 (six) hours as needed for cough. 180 mL 1  . IPRATROPIUM-ALBUTEROL 0.5-2.5 (3) MG/3ML IN SOLN  One treatment at breakfast, lunch, dinner and bedtime.  Can take 2 additional treatments per day as needed if having a bad day.  Dx code  ICD 492.8 360 mL 11  . LOSARTAN POTASSIUM 50 MG PO TABS   TAKE 1 TABLET EVERY DAY 90 tablet 4  . MELATONIN 5 MG PO TABS Oral Take 1 tablet by mouth at bedtime.      . MULTIVITAMINS PO CAPS Oral Take 1 capsule by mouth daily.      Marland Kitchen NAMENDA 10 MG PO TABS  TAKE 1 TABLET TWICE A DAY 60 tablet 9  . POTASSIUM CHLORIDE CRYS ER 20 MEQ PO TBCR Oral Take 1 tablet (20 mEq total) by mouth daily. 90 tablet 2  . PROVENTIL HFA 108 (90 BASE) MCG/ACT IN AERS  USE 2 PUFFS EVERY 4 HOURS AS NEEDED 6.7 g 3  . SERTRALINE HCL 50 MG PO TABS Oral Take 1 tablet (50 mg total) by mouth daily. 30 tablet 11  . SIMVASTATIN 80 MG PO TABS Oral Take by mouth. Take 1/2 by mouth once a day       BP 118/54  Pulse 74  Temp(Src) 99.1 F (37.3 C) (Oral)  Resp 21  SpO2 95%  Physical Exam  Nursing note and vitals reviewed. Constitutional: He is oriented to person, place, and time. He appears well-developed and well-nourished.  HENT:  Head: Normocephalic and atraumatic.  Eyes: Conjunctivae and EOM are normal. Pupils are equal, round, and reactive to light.  Neck: Neck supple.  Cardiovascular: Normal rate and regular rhythm.  Exam reveals no gallop and no friction rub.   No murmur heard. Pulmonary/Chest: Breath sounds normal. He has no wheezes. He has no rales. He exhibits no tenderness.       No respiratory distress, rhonchi in right lung field. Upon auscultation  Abdominal: Soft. Bowel sounds are normal. He exhibits no distension. There is no tenderness. There is no rebound and no guarding.  Musculoskeletal: Normal range of motion.  Neurological: He is alert and oriented to person, place, and time. No cranial nerve deficit. Coordination normal.  Skin: Skin is warm and dry. No rash noted.  Psychiatric: He has a normal mood and affect.    ED Course  Procedures (including critical care time)  Labs Reviewed  CBC - Abnormal; Notable for the following:    WBC 21.8 (*)    All other components within normal limits  DIFFERENTIAL - Abnormal; Notable for the following:     Neutrophils Relative 92 (*)    Lymphocytes Relative 2 (*)    Neutro Abs 20.1 (*)  Lymphs Abs 0.4 (*)    Monocytes Absolute 1.3 (*)    All other components within normal limits  COMPREHENSIVE METABOLIC PANEL - Abnormal; Notable for the following:    Glucose, Bld 235 (*)    BUN 28 (*)    GFR calc non Af Amer 57 (*)    GFR calc Af Amer 66 (*)    All other components within normal limits  URINALYSIS, ROUTINE W REFLEX MICROSCOPIC - Abnormal; Notable for the following:    Glucose, UA 100 (*)    Bilirubin Urine SMALL (*)    Ketones, ur TRACE (*)    All other components within normal limits  LACTIC ACID, PLASMA  PROCALCITONIN  CULTURE, BLOOD (ROUTINE X 2)  CULTURE, BLOOD (ROUTINE X 2)  URINE CULTURE  INFLUENZA PANEL BY PCR   Dg Pneumonia Chest 2v  04/29/2011  *RADIOLOGY REPORT*  Clinical Data: Fever, cough  CHEST - 2 VIEW  Comparison: 01/24/2011  Findings: Normal heart size without CHF or edema.  Stable background COPD/emphysema with prominent basilar vascular interstitial markings.  No new consolidation, edema, collapse, effusion or pneumothorax.  Degenerative changes of the spine.  IMPRESSION: Stable COPD/emphysema.  No superimposed pneumonia or edema.  Original Report Authenticated By: Judie Petit. Ruel Favors, M.D.     1. Bronchitis   2. Dehydration   3. Hyperglycemia       MDM  Pt is seen and examined;  Initial history and physical completed.  Will follow.     Results for orders placed during the hospital encounter of 04/28/11  CBC      Component Value Range   WBC 21.8 (*) 4.0 - 10.5 (K/uL)   RBC 4.72  4.22 - 5.81 (MIL/uL)   Hemoglobin 14.8  13.0 - 17.0 (g/dL)   HCT 16.1  09.6 - 04.5 (%)   MCV 94.1  78.0 - 100.0 (fL)   MCH 31.4  26.0 - 34.0 (pg)   MCHC 33.3  30.0 - 36.0 (g/dL)   RDW 40.9  81.1 - 91.4 (%)   Platelets 173  150 - 400 (K/uL)  DIFFERENTIAL      Component Value Range   Neutrophils Relative 92 (*) 43 - 77 (%)   Lymphocytes Relative 2 (*) 12 - 46 (%)    Monocytes Relative 6  3 - 12 (%)   Eosinophils Relative 0  0 - 5 (%)   Basophils Relative 0  0 - 1 (%)   Neutro Abs 20.1 (*) 1.7 - 7.7 (K/uL)   Lymphs Abs 0.4 (*) 0.7 - 4.0 (K/uL)   Monocytes Absolute 1.3 (*) 0.1 - 1.0 (K/uL)   Eosinophils Absolute 0.0  0.0 - 0.7 (K/uL)   Basophils Absolute 0.0  0.0 - 0.1 (K/uL)   Smear Review MORPHOLOGY UNREMARKABLE    COMPREHENSIVE METABOLIC PANEL      Component Value Range   Sodium 135  135 - 145 (mEq/L)   Potassium 4.3  3.5 - 5.1 (mEq/L)   Chloride 98  96 - 112 (mEq/L)   CO2 27  19 - 32 (mEq/L)   Glucose, Bld 235 (*) 70 - 99 (mg/dL)   BUN 28 (*) 6 - 23 (mg/dL)   Creatinine, Ser 7.82  0.50 - 1.35 (mg/dL)   Calcium 9.8  8.4 - 95.6 (mg/dL)   Total Protein 7.0  6.0 - 8.3 (g/dL)   Albumin 3.8  3.5 - 5.2 (g/dL)   AST 17  0 - 37 (U/L)   ALT 19  0 - 53 (U/L)  Alkaline Phosphatase 76  39 - 117 (U/L)   Total Bilirubin 0.4  0.3 - 1.2 (mg/dL)   GFR calc non Af Amer 57 (*) >90 (mL/min)   GFR calc Af Amer 66 (*) >90 (mL/min)  LACTIC ACID, PLASMA      Component Value Range   Lactic Acid, Venous 1.5  0.5 - 2.2 (mmol/L)  PROCALCITONIN      Component Value Range   Procalcitonin <0.10    URINALYSIS, ROUTINE W REFLEX MICROSCOPIC      Component Value Range   Color, Urine YELLOW  YELLOW    APPearance CLEAR  CLEAR    Specific Gravity, Urine 1.027  1.005 - 1.030    pH 5.5  5.0 - 8.0    Glucose, UA 100 (*) NEGATIVE (mg/dL)   Hgb urine dipstick NEGATIVE  NEGATIVE    Bilirubin Urine SMALL (*) NEGATIVE    Ketones, ur TRACE (*) NEGATIVE (mg/dL)   Protein, ur NEGATIVE  NEGATIVE (mg/dL)   Urobilinogen, UA 1.0  0.0 - 1.0 (mg/dL)   Nitrite NEGATIVE  NEGATIVE    Leukocytes, UA NEGATIVE  NEGATIVE    Dg Pneumonia Chest 2v  04/29/2011  *RADIOLOGY REPORT*  Clinical Data: Fever, cough  CHEST - 2 VIEW  Comparison: 01/24/2011  Findings: Normal heart size without CHF or edema.  Stable background COPD/emphysema with prominent basilar vascular interstitial markings.  No  new consolidation, edema, collapse, effusion or pneumothorax.  Degenerative changes of the spine.  IMPRESSION: Stable COPD/emphysema.  No superimposed pneumonia or edema.  Original Report Authenticated By: Judie Petit. Ruel Favors, M.D.      Patient has been seen by the triad hospitalist. She did point out that he has recently been on steroids, which would likely explain the leukocytosis. However, there is no evidence of pneumonia. Urine shows trace ketones, no signs of infection. Lactic acid, calcitonin is normal.  Other labs appear to be reassuring. At this point in time. The hospitalist is recommending discharging the patient home with likely a viral illness. She's also recommending Tamiflu. Patient is amenable and states can see his doctor in the next 24-48 hours. He is told to return to ED for any concerns. Discharged home in recommendation of the hospitalist   Filemon Breton A. Patrica Duel, MD 04/29/11 4540

## 2011-04-28 NOTE — ED Notes (Signed)
ZOX:WRUE<AV> Expected date:04/28/11<BR> Expected time:10:40 PM<BR> Means of arrival:Ambulance<BR> Comments:<BR> EMS 33 GC - general illness/ febrile

## 2011-04-29 ENCOUNTER — Telehealth: Payer: Self-pay | Admitting: *Deleted

## 2011-04-29 LAB — DIFFERENTIAL
Basophils Absolute: 0 10*3/uL (ref 0.0–0.1)
Basophils Relative: 0 % (ref 0–1)
Eosinophils Absolute: 0 10*3/uL (ref 0.0–0.7)
Eosinophils Relative: 0 % (ref 0–5)
Lymphocytes Relative: 2 % — ABNORMAL LOW (ref 12–46)
Lymphs Abs: 0.4 10*3/uL — ABNORMAL LOW (ref 0.7–4.0)
Monocytes Absolute: 1.3 10*3/uL — ABNORMAL HIGH (ref 0.1–1.0)
Monocytes Relative: 6 % (ref 3–12)
Neutro Abs: 20.1 10*3/uL — ABNORMAL HIGH (ref 1.7–7.7)
Neutrophils Relative %: 92 % — ABNORMAL HIGH (ref 43–77)

## 2011-04-29 LAB — CULTURE, BLOOD (ROUTINE X 2)
Culture  Setup Time: 201301150332
Culture: NO GROWTH

## 2011-04-29 LAB — CBC
HCT: 44.4 % (ref 39.0–52.0)
MCV: 94.1 fL (ref 78.0–100.0)
RBC: 4.72 MIL/uL (ref 4.22–5.81)
WBC: 21.8 10*3/uL — ABNORMAL HIGH (ref 4.0–10.5)

## 2011-04-29 LAB — URINALYSIS, ROUTINE W REFLEX MICROSCOPIC
Glucose, UA: 100 mg/dL — AB
Leukocytes, UA: NEGATIVE
Protein, ur: NEGATIVE mg/dL

## 2011-04-29 LAB — URINE CULTURE
Colony Count: NO GROWTH
Culture: NO GROWTH

## 2011-04-29 LAB — COMPREHENSIVE METABOLIC PANEL
ALT: 19 U/L (ref 0–53)
AST: 17 U/L (ref 0–37)
Albumin: 3.8 g/dL (ref 3.5–5.2)
Alkaline Phosphatase: 76 U/L (ref 39–117)
BUN: 28 mg/dL — ABNORMAL HIGH (ref 6–23)
CO2: 27 mEq/L (ref 19–32)
Calcium: 9.8 mg/dL (ref 8.4–10.5)
Chloride: 98 mEq/L (ref 96–112)
Creatinine, Ser: 1.14 mg/dL (ref 0.50–1.35)
GFR calc Af Amer: 66 mL/min — ABNORMAL LOW (ref >90)
GFR calc non Af Amer: 57 mL/min — ABNORMAL LOW (ref >90)
Glucose, Bld: 235 mg/dL — ABNORMAL HIGH (ref 70–99)
Potassium: 4.3 mEq/L (ref 3.5–5.1)
Sodium: 135 mEq/L (ref 135–145)
Total Bilirubin: 0.4 mg/dL (ref 0.3–1.2)
Total Protein: 7 g/dL (ref 6.0–8.3)

## 2011-04-29 LAB — PROCALCITONIN: Procalcitonin: <0.1 ng/mL

## 2011-04-29 LAB — LACTIC ACID, PLASMA: Lactic Acid, Venous: 1.5 mmol/L (ref 0.5–2.2)

## 2011-04-29 MED ORDER — OSELTAMIVIR PHOSPHATE 75 MG PO CAPS: 75.0000 mg | ORAL_CAPSULE | Freq: Two times a day (BID) | ORAL | Status: AC

## 2011-04-29 NOTE — ED Notes (Signed)
Lab bedside to obtain blood cultures

## 2011-04-29 NOTE — Consults (Signed)
PCP:   Illene Regulus, MD, MD   Chief Complaint:  Not feeling well  HPI: This is a 78 year old gentleman who lives at home with his wife. He does have a history of moderate dementia. Today his wife noted the patient seemed feeling poorly. His appetite appeared to be off. He is diabetic his finger stick blood sugars were checked there were 257. He did not seem to feel well she was brought to the ER. There is no report of any fevers, chills, nausea, vomiting, burning or urination, cough, shortness of breath, or wheeze. There is a report of any pain nor any palpitations. The patient's and in the recent past had bronchitis and was treated with at a date course of prednisone, this was discontinued 2 days ago. History provided mainly by the daughters at the bedside. The patient is awake and alert but pleasantly demented. He is able to answer some questions he denies any ill feeling.  Review of Systems: Positives bolded  anorexia, fever, weight loss,, vision loss, decreased hearing, hoarseness, chest pain, syncope, dyspnea on exertion, peripheral edema, balance deficits, hemoptysis, abdominal pain, melena, hematochezia, severe indigestion/heartburn, hematuria, incontinence, genital sores, muscle weakness, suspicious skin lesions, transient blindness, difficulty walking, depression, unusual weight change, abnormal bleeding, enlarged lymph nodes, angioedema, and breast masses.  Past Medical History: Past Medical History  Diagnosis Date  . Bradycardia   . dementia   . mass, lung right lilum     patient had an abnormal CT chest 2+ years ago after infection: changes thougth to be a reaction and did clear.   . Shortness of breath   . weakness   . Chronic respiratory failure   . Obstructive sleep apnea (adult) (pediatric)   . allergi rhin, chronic   . Emphysema   . inguinal hernia, right   . Other specified disorder of skin   . COPD (chronic obstructive pulmonary disease)   . varicose vein     mild    . Hypertension   . coronary artery disease   . History of cardiac cath 06/03/04    acute info post MI, stent PICA  . History of cardiac cath 06/03/04    acute info post MI, stent PICA  . Asthma   . diabetes mell, type 2   . Pure hypercholesterolemia    Past Surgical History  Procedure Date  . Cholecystectomy 01/26/03-01/31/2003     [facility] ERCP, cholecystitis  . Stress/adenosine myoview 03/25/04    abnormal EF 50%  . Total hip arthroplasty 02/06    Left, anemia 2 units PRBC  . Adenosine myoview 08/13/05    EF 46%  . Total hip arthroplasty 05/31/05    right  . Ct angio chest 10/28/08    Neg PE, 3x3x2.2 cm right hilar mass  . Pet scan 11/02/08    Inflam/Infect Atelect RUL w/reactive lymphadenopathy  . Hernia repair 01/00    left recurrent    Medications: Prior to Admission medications   Medication Sig Start Date End Date Taking? Authorizing Provider  acetaminophen (TYLENOL) 500 MG tablet Take 1,000 mg by mouth at bedtime.     Yes Historical Provider, MD  aspirin EC 81 MG EC tablet Take 162 mg by mouth daily.    Yes Historical Provider, MD  budesonide (PULMICORT) 0.5 MG/2ML nebulizer solution 1 vial in nebulizer twice daily  DX code  ICD 492.8 09/10/10  Yes Barbaraann Share, MD  cetirizine (ZYRTEC ALLERGY) 10 MG tablet Take 10 mg by mouth daily.  Yes Historical Provider, MD  cholecalciferol (VITAMIN D) 1000 UNITS tablet Take 1,000 Units by mouth daily.     Yes Historical Provider, MD  clopidogrel (PLAVIX) 75 MG tablet TAKE 1 TABLET EVERY DAY 03/30/11  Yes Antonieta Iba, MD  donepezil (ARICEPT) 10 MG tablet Take 1 tablet (10 mg total) by mouth daily. 10/29/10  Yes Varney Baas, MD  fluticasone (FLONASE) 50 MCG/ACT nasal spray 2 sprays by Nasal route daily. 08/13/10 08/13/11 Yes Duke Salvia, MD  furosemide (LASIX) 20 MG tablet TAKE 1 TABLET EVERY DAY 04/22/11  Yes Duke Salvia, MD  glimepiride (AMARYL) 4 MG tablet TAKE 1 TABLET EVERY DAY 08/19/10  Yes Duke Salvia, MD  HYDROcodone-homatropine (HYDROMET) 5-1.5 MG/5ML syrup Take 5 mLs by mouth every 6 (six) hours as needed for cough. 01/24/11  Yes Barbaraann Share, MD  ipratropium-albuterol (DUONEB) 0.5-2.5 (3) MG/3ML SOLN One treatment at breakfast, lunch, dinner and bedtime.  Can take 2 additional treatments per day as needed if having a bad day.  Dx code  ICD 492.8 09/10/10  Yes Barbaraann Share, MD  losartan (COZAAR) 50 MG tablet TAKE 1 TABLET EVERY DAY 11/25/10  Yes Duke Salvia, MD  Melatonin 5 MG TABS Take 1 tablet by mouth at bedtime.     Yes Historical Provider, MD  Multiple Vitamin (MULTIVITAMIN) capsule Take 1 capsule by mouth daily.     Yes Historical Provider, MD  NAMENDA 10 MG tablet TAKE 1 TABLET TWICE A DAY 04/22/11  Yes Duke Salvia, MD  potassium chloride SA (KLOR-CON M20) 20 MEQ tablet Take 1 tablet (20 mEq total) by mouth daily. 11/20/10  Yes Antonieta Iba, MD  PROVENTIL HFA 108 (90 BASE) MCG/ACT inhaler USE 2 PUFFS EVERY 4 HOURS AS NEEDED 02/19/11  Yes Barbaraann Share, MD  sertraline (ZOLOFT) 50 MG tablet Take 1 tablet (50 mg total) by mouth daily. 03/04/11 03/03/12 Yes Duke Salvia, MD  simvastatin (ZOCOR) 80 MG tablet Take by mouth. Take 1/2 by mouth once a day    Yes Historical Provider, MD  oseltamivir (TAMIFLU) 75 MG capsule Take 1 capsule (75 mg total) by mouth every 12 (twelve) hours. 04/29/11 05/09/11  Peter A. Patrica Duel, MD    Allergies:   Allergies  Allergen Reactions  . Amoxicillin     REACTION: unspecified  . Clarithromycin     REACTION: UNSPECIFIED  . Penicillins     REACTION: unspecified    Social History:  reports that he quit smoking about 50 years ago. His smoking use included Cigarettes. He has a 25 pack-year smoking history. He has never used smokeless tobacco. He reports that he does not drink alcohol or use illicit drugs.  Family History: Family History  Problem Relation Age of Onset  . Cancer Sister     breast  .  Diabetes Brother   . Hypertension Brother   . Hyperlipidemia Brother   . Parkinsonism Brother   . Cancer Sister     Physical Exam: Filed Vitals:   04/29/11 0100 04/29/11 0115 04/29/11 0145 04/29/11 0256  BP: 99/49 98/50 118/54 101/54  Pulse: 76 75 74 73  Temp:    98 F (36.7 C)  TempSrc:      Resp:   21 16  SpO2: 92% 95% 95% 96%    General:  Alert pleasantly demented, well developed and nourished, no acute distress Eyes: PERRLA, pink conjunctiva, no scleral icterus ENT: Moist oral mucosa, neck supple, no thyromegaly Lungs: clear  to ascultation, no wheeze, no crackles, no use of accessory muscles Cardiovascular: regular rate and rhythm, no regurgitation, no gallops, no murmurs. No carotid bruits, no JVD Abdomen: soft, positive BS, non-tender, non-distended, no organomegaly, not an acute abdomen GU: not examined Neuro: CN II - XII grossly intact, sensation intact Musculoskeletal: strength 5/5 all extremities, no clubbing, cyanosis or edema Skin: no rash, no subcutaneous crepitation, no decubitus    Labs on Admission:   Basename 04/28/11 2340  NA 135  K 4.3  CL 98  CO2 27  GLUCOSE 235*  BUN 28*  CREATININE 1.14  CALCIUM 9.8  MG --  PHOS --    Basename 04/28/11 2340  AST 17  ALT 19  ALKPHOS 76  BILITOT 0.4  PROT 7.0  ALBUMIN 3.8   No results found for this basename: LIPASE:2,AMYLASE:2 in the last 72 hours  Basename 04/28/11 2340  WBC 21.8*  NEUTROABS 20.1*  HGB 14.8  HCT 44.4  MCV 94.1  PLT 173   No results found for this basename: CKTOTAL:3,CKMB:3,CKMBINDEX:3,TROPONINI:3 in the last 72 hours No components found with this basename: POCBNP:3 No results found for this basename: DDIMER:2 in the last 72 hours No results found for this basename: HGBA1C:2 in the last 72 hours No results found for this basename: CHOL:2,HDL:2,LDLCALC:2,TRIG:2,CHOLHDL:2,LDLDIRECT:2 in the last 72 hours No results found for this basename: TSH,T4TOTAL,FREET3,T3FREE,THYROIDAB in  the last 72 hours No results found for this basename: VITAMINB12:2,FOLATE:2,FERRITIN:2,TIBC:2,IRON:2,RETICCTPCT:2 in the last 72 hours  Micro Results: No results found for this or any previous visit (from the past 240 hour(s)). Results for ZAKIR, HENNER (MRN [id number]) as of 04/29/2011 03:07  Ref. Range 04/29/2011 01:30  Color, Urine Latest Range: YELLOW  YELLOW  APPearance Latest Range: CLEAR  CLEAR  Specific Gravity, Urine Latest Range: 1.005-1.030  1.027  pH Latest Range: 5.0-8.0  5.5  Glucose, UA Latest Range: NEGATIVE mg/dL 782 (A)  Bilirubin Urine Latest Range: NEGATIVE  SMALL (A)  Ketones, ur Latest Range: NEGATIVE mg/dL TRACE (A)  Protein Latest Range: NEGATIVE mg/dL NEGATIVE  Urobilinogen, UA Latest Range: 0.0-1.0 mg/dL 1.0  Nitrite Latest Range: NEGATIVE  NEGATIVE  Leukocytes, UA Latest Range: NEGATIVE  NEGATIVE    Radiological Exams on Admission: Dg Pneumonia Chest 2v  04/29/2011  *RADIOLOGY REPORT*  Clinical Data: Fever, cough  CHEST - 2 VIEW  Comparison: 01/24/2011  Findings: Normal heart size without CHF or edema.  Stable background COPD/emphysema with prominent basilar vascular interstitial markings.  No new consolidation, edema, collapse, effusion or pneumothorax.  Degenerative changes of the spine.  IMPRESSION: Stable COPD/emphysema.  No superimposed pneumonia or edema.  Original Report Authenticated By: Judie Petit. Ruel Favors, M.D.    Assessment/Plan Present on Admission:   leukocytosis and some weakness Likely due to viral etiology Workup. Is negative  Patient's leukocytosis most likely from his recent course of prednisone Would not recommend an admission at this time, however, would recommend to monitoring and close followup with his PCP Influenza PCR is been ordered and results pending   Jearld Hemp 04/29/2011, 3:03 AM

## 2011-04-29 NOTE — Telephone Encounter (Signed)
Pt was seen at the ER last night with elevated white blood cell count and elevated blood sugar. Pt was prescribed tami-flu &  would like your opinion. do they need to schedule an OV. Please advise.

## 2011-04-29 NOTE — Telephone Encounter (Signed)
Phoned pt & has an appt for Thursday.

## 2011-04-29 NOTE — Telephone Encounter (Signed)
For persistent fever or productive cough or SOB will need to see tomorrow or Thursday.

## 2011-05-01 ENCOUNTER — Encounter: Payer: Self-pay | Admitting: Internal Medicine

## 2011-05-01 ENCOUNTER — Ambulatory Visit (INDEPENDENT_AMBULATORY_CARE_PROVIDER_SITE_OTHER): Payer: Medicare Other | Admitting: Internal Medicine

## 2011-05-01 ENCOUNTER — Other Ambulatory Visit (INDEPENDENT_AMBULATORY_CARE_PROVIDER_SITE_OTHER): Payer: Medicare Other

## 2011-05-01 DIAGNOSIS — E785 Hyperlipidemia, unspecified: Secondary | ICD-10-CM

## 2011-05-01 DIAGNOSIS — E119 Type 2 diabetes mellitus without complications: Secondary | ICD-10-CM

## 2011-05-01 DIAGNOSIS — J9610 Chronic respiratory failure, unspecified whether with hypoxia or hypercapnia: Secondary | ICD-10-CM

## 2011-05-01 DIAGNOSIS — J069 Acute upper respiratory infection, unspecified: Secondary | ICD-10-CM

## 2011-05-01 DIAGNOSIS — R05 Cough: Secondary | ICD-10-CM

## 2011-05-01 LAB — LIPID PANEL
Cholesterol: 125 mg/dL (ref 0–200)
HDL: 59.3 mg/dL (ref >39.00)
LDL Cholesterol: 47 mg/dL (ref 0–99)
Triglycerides: 93 mg/dL (ref 0.0–149.0)
VLDL: 18.6 mg/dL (ref 0.0–40.0)

## 2011-05-01 LAB — CBC WITH DIFFERENTIAL/PLATELET
Basophils Relative: 0.3 % (ref 0.0–3.0)
Eosinophils Relative: 2.2 % (ref 0.0–5.0)
Lymphocytes Relative: 8.2 % — ABNORMAL LOW (ref 12.0–46.0)
MCV: 96.3 fl (ref 78.0–100.0)
Monocytes Relative: 6.4 % (ref 3.0–12.0)
Neutrophils Relative %: 82.9 % — ABNORMAL HIGH (ref 43.0–77.0)
Platelets: 156 10*3/uL (ref 150.0–400.0)
RBC: 4.19 Mil/uL — ABNORMAL LOW (ref 4.22–5.81)
WBC: 12.2 10*3/uL — ABNORMAL HIGH (ref 4.5–10.5)

## 2011-05-01 LAB — HEMOGLOBIN A1C: Hgb A1c MFr Bld: 7 % — ABNORMAL HIGH (ref 4.6–6.5)

## 2011-05-01 NOTE — Progress Notes (Signed)
  Subjective:    Patient ID: [name], male    DOB: 12/17/1924, 78 y.o.   MRN: [id number]  HPI [name] was seen in the [facility] Jan 14th for generalized discomfort. ED notes, labs, imaging reviewed: he had a leukocytosis to 20.8 with 92 % segs, Serum glucose of 235, CXR - NAD. Final diagnosis was a flu like illness although he had no fever. He was given Avelox 400 mg IV x1 and was sent home on tamiflu. In the interval he has been OK but has had a poor appetite. He continues to have a cough but it is not productive. No report of recurrent fever. No report of N/V/D. [name] says he feels fine.   I have reviewed the patient's medical history in detail and updated the computerized patient record.    Review of Systems System review is negative for any constitutional, cardiac, pulmonary, GI or neuro symptoms or complaints other than as described in the HPI.     Objective:   Physical Exam Filed Vitals:   05/01/11 0943  BP: 130/74  Pulse: 70  Temp: 97.8 F (36.6 C)   Gen'l- WNWD elderly white man in no distress and in good humor HEENT_ C&S clear Pulom - normal respirations, lungs CTAP Cor - RRR Neuro - awake and alert, poor memory, clear speech. CN II- XII normal, normal gait.       Assessment & Plan:  Viral illness with leukocytosis - he does seem improved with no signs or symptoms of acute illness  Plan - f/u [facility]

## 2011-05-01 NOTE — Assessment & Plan Note (Signed)
stable with no increased WOB.  Plan - continue present regimen           See Dr. Shelle Iron as instructed.

## 2011-05-01 NOTE — Assessment & Plan Note (Signed)
No productive sputum today. O2 sat is normal

## 2011-05-01 NOTE — Assessment & Plan Note (Signed)
CBGs have been elevated - highest reading 257.  Lab Results  Component Value Date   HGBA1C 7.3* 07/30/2010   Plan - follow-up A1C with recommendations to follow.

## 2011-05-04 ENCOUNTER — Encounter: Payer: Self-pay | Admitting: Internal Medicine

## 2011-05-05 ENCOUNTER — Telehealth: Payer: Self-pay | Admitting: Internal Medicine

## 2011-05-05 ENCOUNTER — Encounter (INDEPENDENT_AMBULATORY_CARE_PROVIDER_SITE_OTHER): Payer: Self-pay | Admitting: General Surgery

## 2011-05-05 ENCOUNTER — Ambulatory Visit (INDEPENDENT_AMBULATORY_CARE_PROVIDER_SITE_OTHER): Payer: Medicare Other | Admitting: General Surgery

## 2011-05-05 VITALS — BP 128/62 | HR 70 | Temp 97.2°F | Resp 20 | Ht 68.0 in | Wt 171.0 lb

## 2011-05-05 DIAGNOSIS — K4091 Unilateral inguinal hernia, without obstruction or gangrene, recurrent: Secondary | ICD-10-CM

## 2011-05-05 NOTE — Telephone Encounter (Signed)
Received copies from [facility],on 1.21.13. Forwarded 2 pages to Dr.Norins ,for review.  sj

## 2011-05-05 NOTE — Progress Notes (Signed)
Subjective:     Patient ID: [name], male   DOB: 1925/04/03, 78 y.o.   MRN: [id number]  HPI This is an 78 year old male who I saw in 2011 with a history of 2 right inguinal hernia repairs. The last was in 2000. That operation reports that his floor was essentially nonexistent at that point. It was prepared with mesh. We discussed operation versus monitoring and at that point. He very much did not want to undergo surgery and decided to just monitor this and continue wearing his hernia. He certainly is not a low risk for an operation given his multiple comorbidities. This includes history of a stroke, coronary artery disease, chronic obstructive pulmonary disease on home oxygen. He returns today after having a little bit more discomfort at the site of his hernia and his daughter-in-law wanted to have this looked at. He tells me today that is really doing well without any complaints or any changes since I last saw him. He apparently does did complain of some discomfort to his wife. He states his bowel movements are going just fine and he doesn't have any complaints of very much this was to continue to observe this area.  Review of Systems     Objective:   Physical Exam Healed incision right groin, large easily reducible right groin hernia somewhat larger than last visit    Assessment:     Recurrent right groin hernia    Plan:     We had a long discussion of operation versus observation. This area certainly larger but is not really caused him any more symptoms at this point. Ideally this should be repaired.. Obviously is at risk of this getting larger or having something else happen that would need to be fixed in an emergency. He and his daughter who are both understanding of this. He very much would still like just to have this area observed. I think that's reasonable after we discussed the risks of surgery again. I asked him to call me back if needed.

## 2011-05-05 NOTE — Patient Instructions (Signed)
Discussed warning signs and what to return for.

## 2011-05-13 ENCOUNTER — Telehealth: Payer: Self-pay | Admitting: Pulmonary Disease

## 2011-05-13 NOTE — Telephone Encounter (Signed)
Spoke with Erie Noe @ [phone] informed here this was done (see 04/16/11 phone note).  She informed me they never received it, they never informed us.  REFAXED OV NOTES AND MED LIST TO -[phone]. Attn Erie Noe. She has called me back to inform me notes has been received and nothing further is needed .Kandice Hams

## 2011-05-13 NOTE — Telephone Encounter (Signed)
Alida, do you have the CMN on this pt? Please advise, thanks!

## 2011-05-15 IMAGING — CR DG CHEST 2V
2 series · 2 of 2 positions shown · non-contrast
Comparison: 11/04/2006

CLINICAL DATA: Chest pain

CHEST - 2 VIEW

[w chest pa]
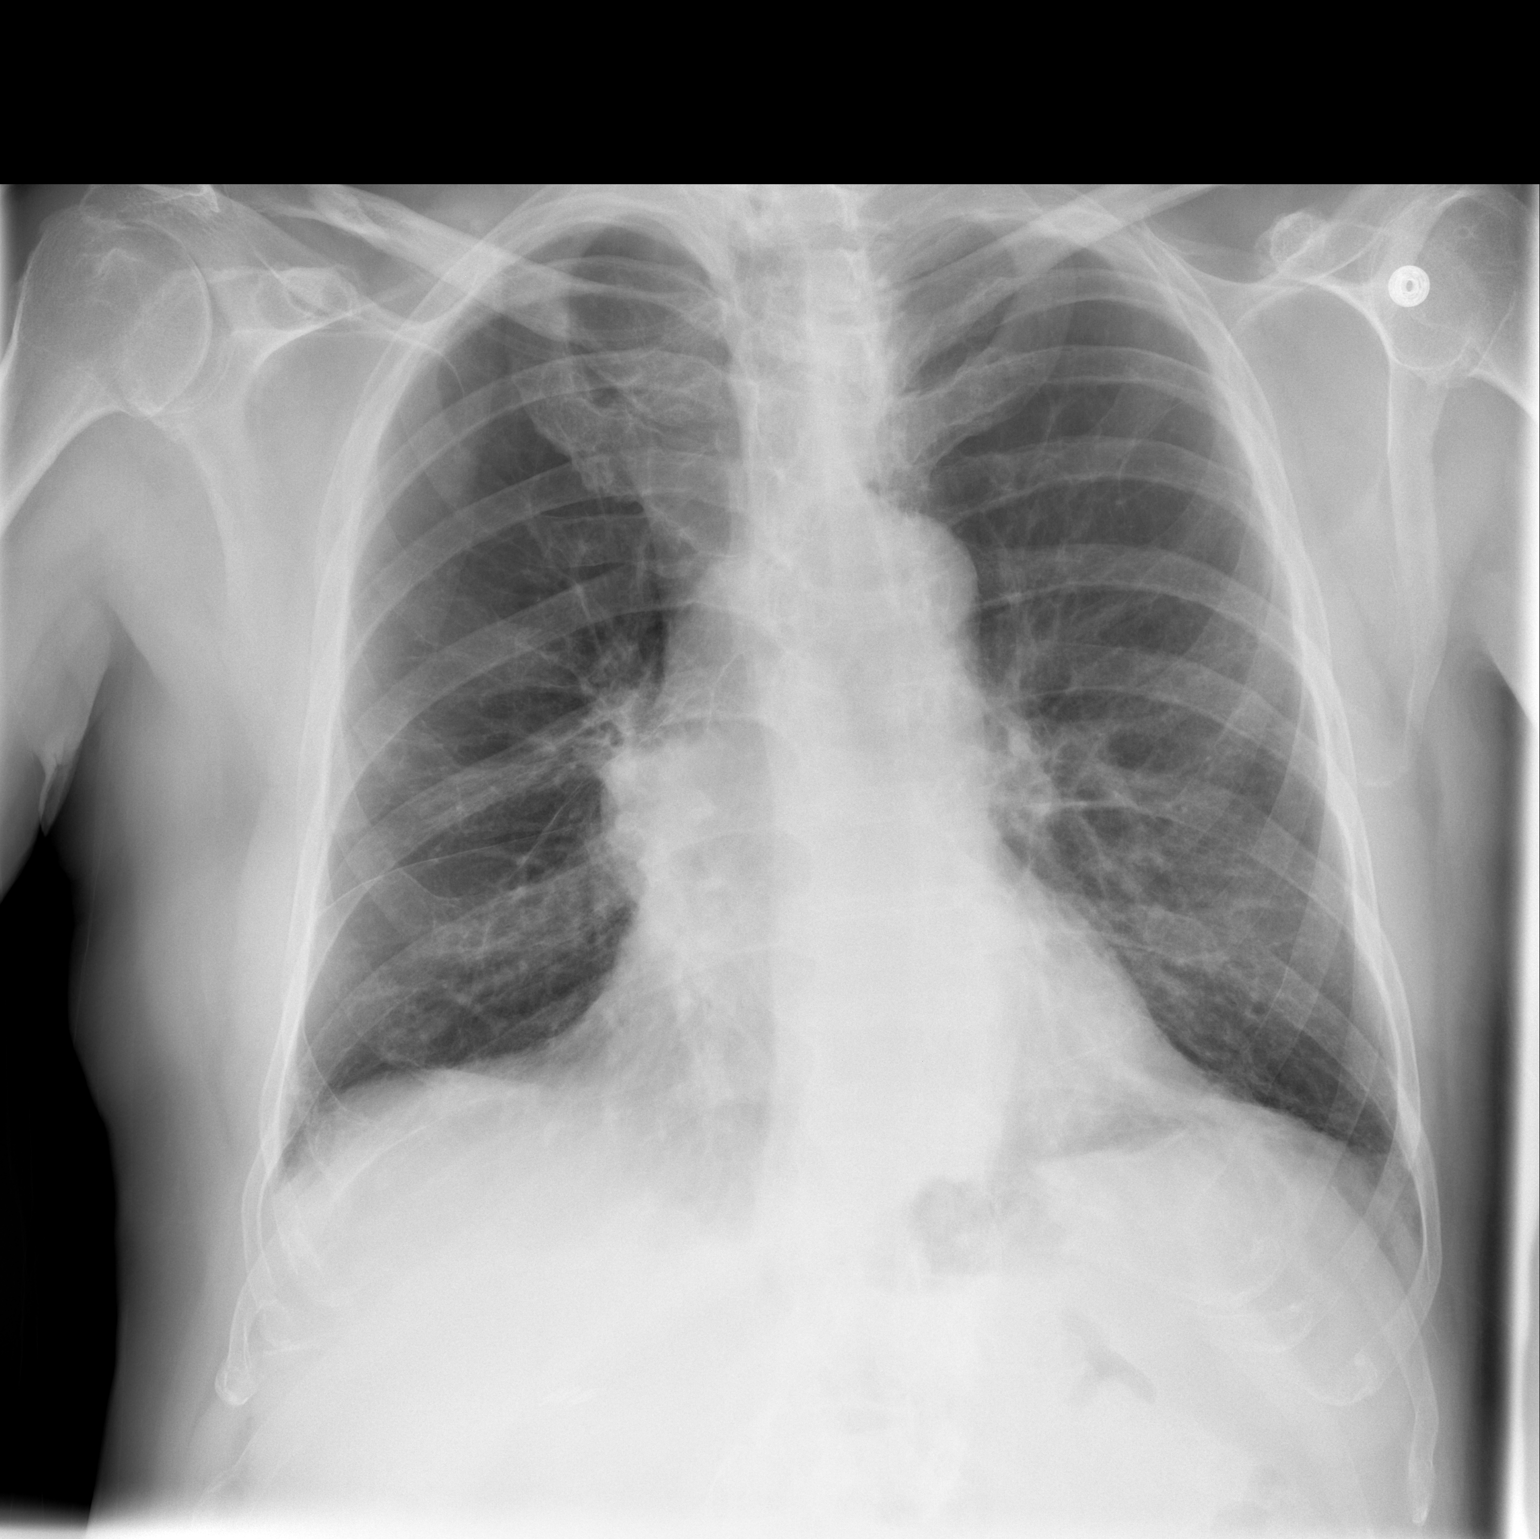

[w chest lat]
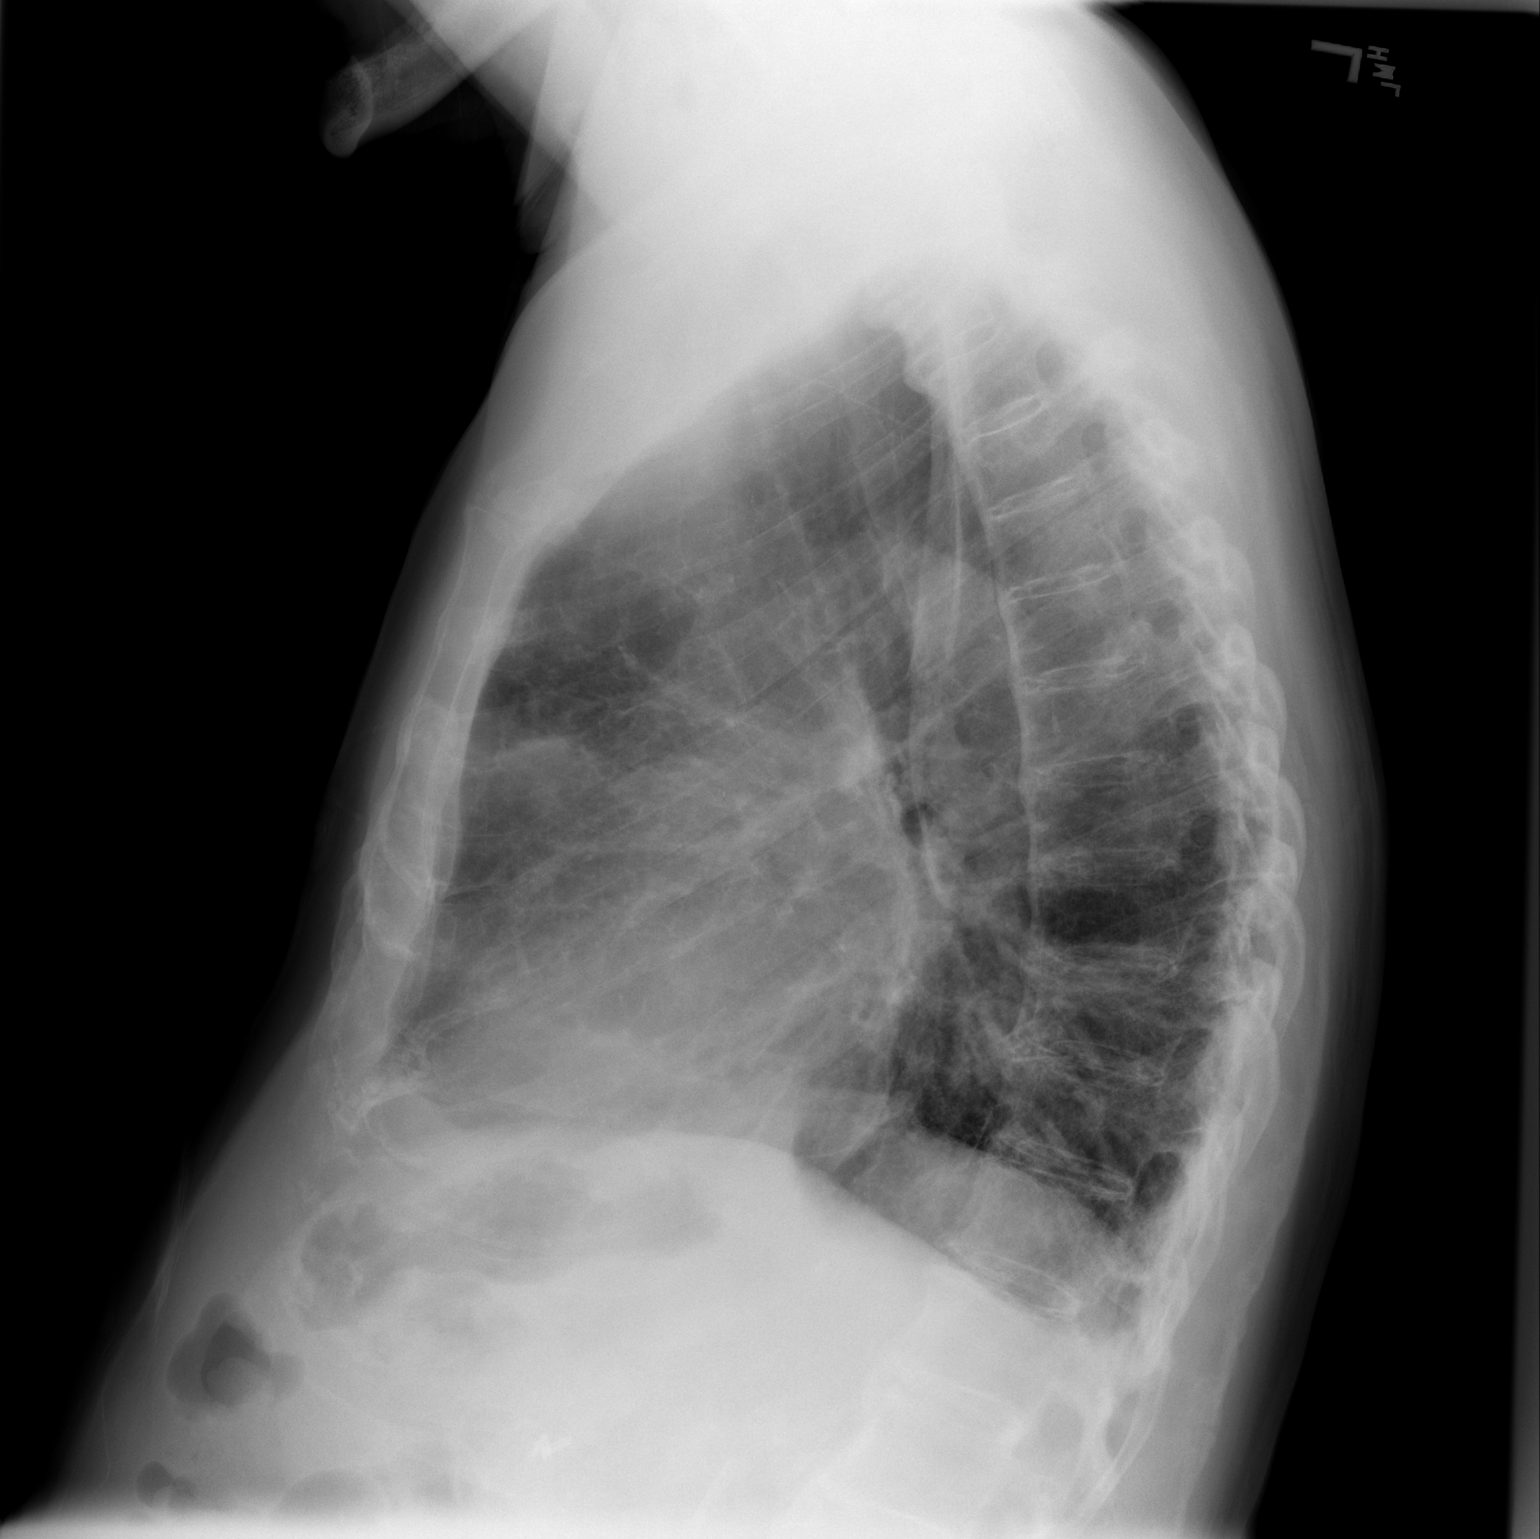

[2 of 2 positions shown; findings below may reference images not displayed]

FINDINGS: Cardiomediastinal silhouette is stable.  Hyperinflation
and chronic interstitial prominence again noted.  No acute
infiltrate or edema. Osteopenia and degenerative changes thoracic
spine again noted.
IMPRESSION: Stable COPD.  No active disease.

## 2011-05-25 ENCOUNTER — Other Ambulatory Visit: Payer: Self-pay | Admitting: Cardiovascular Disease

## 2011-05-27 ENCOUNTER — Ambulatory Visit: Payer: Medicare Other | Admitting: Pulmonary Disease

## 2011-05-27 ENCOUNTER — Encounter: Payer: Self-pay | Admitting: Internal Medicine

## 2011-05-27 ENCOUNTER — Ambulatory Visit (INDEPENDENT_AMBULATORY_CARE_PROVIDER_SITE_OTHER): Payer: Medicare Other | Admitting: Internal Medicine

## 2011-05-27 DIAGNOSIS — I10 Essential (primary) hypertension: Secondary | ICD-10-CM

## 2011-05-27 DIAGNOSIS — J9610 Chronic respiratory failure, unspecified whether with hypoxia or hypercapnia: Secondary | ICD-10-CM

## 2011-05-27 DIAGNOSIS — F068 Other specified mental disorders due to known physiological condition: Secondary | ICD-10-CM

## 2011-05-27 DIAGNOSIS — E119 Type 2 diabetes mellitus without complications: Secondary | ICD-10-CM

## 2011-05-27 NOTE — Patient Instructions (Signed)
Overall seems stable on exam today, including a 100 foot walk. Blood pressure is running a little low - please stop the lasix.  Diabetes - it is very important to eat 3 meals a day and a snack while taking the amaryl!  Services for older adults - Programme researcher, broadcasting/film/video  [email or web address] [phone]

## 2011-05-27 NOTE — Progress Notes (Signed)
Subjective:    Patient ID: [name], male    DOB: [birth year], 78 y.o.   MRN: [id number]  HPI [name] is seen acutely for increased weakness. His memory is very poor. HIs daughter reports that he has been sleeping a lot more, complains of having no energy to the ponit where he is not getting out of bed much. He also has increasing DOE, despite O2 at 3 liters. He has had some cold symptoms as well but no acute febrile illness.  Past Medical History  Diagnosis Date  . Bradycardia   . dementia   . mass, lung right lilum     patient had an abnormal CT chest 2+ years ago after infection: changes thougth to be a reaction and did clear.   . Shortness of breath   . weakness   . Chronic respiratory failure   . Obstructive sleep apnea (adult) (pediatric)   . allergi rhin, chronic   . Emphysema   . inguinal hernia, right   . Other specified disorder of skin   . COPD (chronic obstructive pulmonary disease)   . varicose vein     mild  . Hypertension   . coronary artery disease   . History of cardiac cath 06/03/04    acute info post MI, stent PICA  . History of cardiac cath 06/03/04    acute info post MI, stent PICA  . Asthma   . diabetes mell, type 2   . Pure hypercholesterolemia    Past Surgical History  Procedure Date  . Cholecystectomy 01/26/03-01/31/2003     [facility] ERCP, cholecystitis  . Stress/adenosine myoview 03/25/04    abnormal EF 50%  . Total hip arthroplasty 02/06    Left, anemia 2 units PRBC  . Adenosine myoview 08/13/05    EF 46%  . Total hip arthroplasty 05/31/05    right  . Ct angio chest 10/28/08    Neg PE, 3x3x2.2 cm right hilar mass  . Pet scan 11/02/08    Inflam/Infect Atelect RUL w/reactive lymphadenopathy  . Hernia repair 01/00    left recurrent   Family History  Problem Relation Age of Onset  . Cancer Sister     breast  . Diabetes Brother   . Hypertension Brother   . Hyperlipidemia Brother   . Parkinsonism Brother   . Cancer Sister    History    Social History  . Marital Status: Married    Spouse Name: N/A    Number of Children: 2  . Years of Education: 8   Occupational History  . 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     Social History Main Topics  . Smoking status: Former Smoker -- 1.0 packs/day for 25 years    Types: Cigarettes    Quit date: 08/12/1960  . Smokeless tobacco: Never Used  . Alcohol Use: No  . Drug Use: No  . Sexually Active: Not Currently   Other Topics Concern  . Not on file   Social History Narrative   Finished 8th grade. Married - 1946 -  . 1 son - '81; 1 dtr - '55;  3 grandchildren. 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  Lives alone with wife. End-of-Life Measures: no Cardiac resuscitation, no mechanical ventilation, no heroic or futile measures.        Review of Systems System review is negative  for any constitutional, cardiac, pulmonary, GI or neuro symptoms or complaints other than as described in the HPI.     Objective:   Physical Exam Filed Vitals:   05/27/11 1604  BP: 104/58  Pulse: 83  Temp: 97 F (36.1 C)  Resp: 18  Oxygen saturation 93% on 3 L  Weight: 169 lb 4 oz (76.771 kg) - down 18 lbs from July '12  Gen'l - elderly, thin white man in no distress HEENT- [place]/AT Pulm - no increased WOB. Ambulated 120 feet - no significant dyspnea or increased WOB. Decreased breath sounds but no wheezing or rales Cor - RRR Neuro - memory not tested but is apparently very poor. CN II-XII non-focal, gait normal, rises from chair w/o assistance.        Assessment & Plan:

## 2011-05-28 ENCOUNTER — Ambulatory Visit (INDEPENDENT_AMBULATORY_CARE_PROVIDER_SITE_OTHER): Payer: Medicare Other | Admitting: Pulmonary Disease

## 2011-05-28 ENCOUNTER — Encounter: Payer: Self-pay | Admitting: Pulmonary Disease

## 2011-05-28 DIAGNOSIS — J438 Other emphysema: Secondary | ICD-10-CM

## 2011-05-28 DIAGNOSIS — J9610 Chronic respiratory failure, unspecified whether with hypoxia or hypercapnia: Secondary | ICD-10-CM

## 2011-05-28 NOTE — Patient Instructions (Signed)
No change in meds I want you to walk in your house for every am and pm everyday.  Once the weather warms up, try to get outside to walk around your yard followup with me in 4mos.

## 2011-05-28 NOTE — Assessment & Plan Note (Signed)
Daughter reports impression of progressive memory loss along with poor appetite and weakness. No clear cut medical issues that are reversible.  Plan - continue present regimen

## 2011-05-28 NOTE — Assessment & Plan Note (Signed)
BP Readings from Last 3 Encounters:  05/27/11 104/58  05/05/11 128/62  05/01/11 130/74

## 2011-05-28 NOTE — Progress Notes (Signed)
  Subjective:    Patient ID: [name], male    DOB: March 17, 1925, 78 y.o.   MRN: [id number]  HPI Patient comes in today for followup of his known COPD with chronic respiratory failure.  He is staying compliant on his bronchodilator regimen, but is really leading a sedentary existence at this point according to the daughter.  He has had one episode of a pulmonary infection since last visit, but has returned back to his usual baseline.  He currently is without significant cough or mucus production.   Review of Systems  Constitutional: Positive for unexpected weight change. Negative for fever.  HENT: Positive for rhinorrhea. Negative for ear pain, nosebleeds, congestion, sore throat, sneezing, trouble swallowing, dental problem, postnasal drip and sinus pressure.   Eyes: Negative for redness and itching.  Respiratory: Positive for cough, shortness of breath and wheezing. Negative for chest tightness.   Cardiovascular: Positive for leg swelling. Negative for palpitations.  Gastrointestinal: Positive for nausea. Negative for vomiting.  Genitourinary: Negative for dysuria.  Musculoskeletal: Negative for joint swelling.  Skin: Negative for rash.  Neurological: Negative for headaches.  Hematological: Bruises/bleeds easily.  Psychiatric/Behavioral: Positive for dysphoric mood. The patient is not nervous/anxious.        Objective:   Physical Exam Well-developed male in no acute distress Nose without purulence or discharge noted Chest with decreased breath sounds, but no wheezes or rhonchi Cardiac exam with regular rate and rhythm Lower extremities without edema, no cyanosis Alert and oriented, moves all 4 extremities.       Assessment & Plan:

## 2011-05-28 NOTE — Assessment & Plan Note (Signed)
No evidence of acute decompensation at today's exam - able to ambulate the circumference of the clinical area (approx 120 feet). Has O2 dependent end-stage emphysema.  Plan - no change in medical or therapeutic regimen.

## 2011-05-28 NOTE — Assessment & Plan Note (Signed)
The patient is doing about as well as can be expected given his age, underlying dementia, and the severity of his underlying lung disease.  I've asked him to continue on his current medications, and have given him a goal in order to improve his activity.  He will followup up with me in 4 months, but sooner if having issues.

## 2011-05-28 NOTE — Assessment & Plan Note (Signed)
Lab Results  Component Value Date   HGBA1C 7.0* 05/01/2011   He is cautioned that while taking amaryl it is very important that he eat 3 moderate meals a day plus a bedtime snack to avoid hypoglycemia secondary to long acting sulfonylurea.

## 2011-06-04 ENCOUNTER — Ambulatory Visit: Payer: Medicare Other | Admitting: Internal Medicine

## 2011-06-16 ENCOUNTER — Telehealth: Payer: Self-pay | Admitting: Pulmonary Disease

## 2011-06-16 NOTE — Telephone Encounter (Signed)
I am not sure what to do about this issues.  My only thought is that he is dry, and should make sure that he is drinking enough during the day.

## 2011-06-16 NOTE — Telephone Encounter (Signed)
Spoke with daughter-in-law and she states the pt is c/o having "hot breath." When I asked her to explain this further she states that is all he says is that his breath and throat is "hot" and that he is constantly asking for something cold to drink. She denies any sores in his mouth that she can see. She states he is not rinsing and gargling every time he uses his inhalers, but the majority of the time he does.  She also states she has noticed some increased SOB and dry cough as well, but the pt has not c/o of this, she has just observed this. . She states this has all been going on x 10 days. I asked if the pt breathes through his mouth a lot and she states he does, so maybe he is having some mouth dryness. I advised I will get message to Dr. Shelle Iron to see if he has any recs for this. Please advise. Carron Curie, CMA Allergies  Allergen Reactions  . Amoxicillin     REACTION: unspecified  . Clarithromycin     REACTION: UNSPECIFIED  . Penicillins     REACTION: unspecified

## 2011-06-16 NOTE — Telephone Encounter (Signed)
Spoke with daughter and they will keep trying to keep him drinking thru out the day  And maybe try ice chips.

## 2011-06-26 ENCOUNTER — Ambulatory Visit (INDEPENDENT_AMBULATORY_CARE_PROVIDER_SITE_OTHER): Payer: Medicare Other | Admitting: Internal Medicine

## 2011-06-26 ENCOUNTER — Other Ambulatory Visit (INDEPENDENT_AMBULATORY_CARE_PROVIDER_SITE_OTHER): Payer: Medicare Other

## 2011-06-26 ENCOUNTER — Encounter: Payer: Self-pay | Admitting: Internal Medicine

## 2011-06-26 ENCOUNTER — Ambulatory Visit (INDEPENDENT_AMBULATORY_CARE_PROVIDER_SITE_OTHER)
Admission: RE | Admit: 2011-06-26 | Discharge: 2011-06-26 | Disposition: A | Discharge status: Home / Self Care | Payer: Medicare Other | Source: Ambulatory Visit | Attending: Internal Medicine | Admitting: Internal Medicine

## 2011-06-26 VITALS — BP 126/62 | HR 79 | Temp 97.6°F | Resp 22 | Wt 175.5 lb

## 2011-06-26 DIAGNOSIS — R0602 Shortness of breath: Secondary | ICD-10-CM

## 2011-06-26 DIAGNOSIS — R05 Cough: Secondary | ICD-10-CM

## 2011-06-26 DIAGNOSIS — R109 Unspecified abdominal pain: Secondary | ICD-10-CM

## 2011-06-26 DIAGNOSIS — R197 Diarrhea, unspecified: Secondary | ICD-10-CM

## 2011-06-26 DIAGNOSIS — J9610 Chronic respiratory failure, unspecified whether with hypoxia or hypercapnia: Secondary | ICD-10-CM

## 2011-06-26 DIAGNOSIS — E119 Type 2 diabetes mellitus without complications: Secondary | ICD-10-CM

## 2011-06-26 DIAGNOSIS — F068 Other specified mental disorders due to known physiological condition: Secondary | ICD-10-CM

## 2011-06-26 LAB — COMPREHENSIVE METABOLIC PANEL
ALT: 20 U/L (ref 0–53)
AST: 20 U/L (ref 0–37)
Alkaline Phosphatase: 73 U/L (ref 39–117)
BUN: 19 mg/dL (ref 6–23)
Calcium: 9.8 mg/dL (ref 8.4–10.5)
Chloride: 99 mEq/L (ref 96–112)
Creatinine, Ser: 1 mg/dL (ref 0.4–1.5)
GFR: 73.06 mL/min (ref >60.00)
Potassium: 4.4 mEq/L (ref 3.5–5.1)

## 2011-06-26 LAB — CBC WITH DIFFERENTIAL/PLATELET
Basophils Absolute: 0.1 10*3/uL (ref 0.0–0.1)
Basophils Relative: 1 % (ref 0.0–3.0)
Eosinophils Absolute: 0.4 10*3/uL (ref 0.0–0.7)
Lymphocytes Relative: 11.2 % — ABNORMAL LOW (ref 12.0–46.0)
MCHC: 32.9 g/dL (ref 30.0–36.0)
MCV: 96.2 fl (ref 78.0–100.0)
Monocytes Absolute: 0.7 10*3/uL (ref 0.1–1.0)
Neutro Abs: 6.8 10*3/uL (ref 1.4–7.7)
Neutrophils Relative %: 75.7 % (ref 43.0–77.0)
RDW: 13.8 % (ref 11.5–14.6)

## 2011-06-26 LAB — URINALYSIS, ROUTINE W REFLEX MICROSCOPIC
Bilirubin Urine: NEGATIVE
Ketones, ur: NEGATIVE
Leukocytes, UA: NEGATIVE
Specific Gravity, Urine: 1.02 (ref 1.000–1.030)
Urobilinogen, UA: 0.2 (ref 0.0–1.0)

## 2011-06-26 LAB — BRAIN NATRIURETIC PEPTIDE: Pro B Natriuretic peptide (BNP): 124 pg/mL — ABNORMAL HIGH (ref 0.0–100.0)

## 2011-06-26 NOTE — Progress Notes (Signed)
Subjective:    Patient ID: [name], male    DOB: [birth year], 78 y.o.   MRN: [id number]  HPI [name] is feeling bad. He has no stamina and he is very SOB. Question of wheezing. He has a deep wet cough occasionally productive of a greyish sputum.C/o hot water brash. C/o "diarrhea" 5-6 loose stools a day. No blood or mucus. Started immodium and that has helped. He has had borborygmi and bloating and distention. He has had low back pain in every position. No fever or chills. Pain is not positional, not constant. The pain is enough to keep him awake. He is been able to keep down lots of fluid.  Blood sugars have been between 60 - 130. Last A1C 7% in Jan '13. He has seen Dr. Shelle Iron in February and was stable.   Past Medical History  Diagnosis Date  . Bradycardia   . dementia   . mass, lung right lilum     patient had an abnormal CT chest 2+ years ago after infection: changes thougth to be a reaction and did clear.   . Shortness of breath   . weakness   . Chronic respiratory failure   . Obstructive sleep apnea (adult) (pediatric)   . allergi rhin, chronic   . Emphysema   . inguinal hernia, right   . Other specified disorder of skin   . COPD (chronic obstructive pulmonary disease)   . varicose vein     mild  . Hypertension   . coronary artery disease   . History of cardiac cath 06/03/04    acute info post MI, stent PICA  . History of cardiac cath 06/03/04    acute info post MI, stent PICA  . Asthma   . diabetes mell, type 2   . Pure hypercholesterolemia    Past Surgical History  Procedure Date  . Cholecystectomy 01/26/03-01/31/2003     [facility] ERCP, cholecystitis  . Stress/adenosine myoview 03/25/04    abnormal EF 50%  . Total hip arthroplasty 02/06    Left, anemia 2 units PRBC  . Adenosine myoview 08/13/05    EF 46%  . Total hip arthroplasty 05/31/05    right  . Ct angio chest 10/28/08    Neg PE, 3x3x2.2 cm right hilar mass  . Pet scan 11/02/08    Inflam/Infect Atelect  RUL w/reactive lymphadenopathy  . Hernia repair 01/00    left recurrent   Family History  Problem Relation Age of Onset  . Cancer Sister     breast  . Diabetes Brother   . Hypertension Brother   . Hyperlipidemia Brother   . Parkinsonism Brother   . Cancer Sister    History   Social History  . Marital Status: Married    Spouse Name: N/A    Number of Children: 2  . Years of Education: 8   Occupational History  . 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     Social History Main Topics  . Smoking status: Former Smoker -- 1.0 packs/day for 25 years    Types: Cigarettes    Quit date: 08/12/1960  . Smokeless tobacco: Never Used  . Alcohol Use: No  . Drug Use: No  . Sexually Active: Not Currently   Other Topics Concern  . Not on file   Social History Narrative   Finished 8th grade. Married - 1946 -  . 1 son - '50; 1  dtr - '55;  3 grandchildren. 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  Lives alone with wife. End-of-Life Measures: no Cardiac resuscitation, no mechanical ventilation, no heroic or futile measures.        Review of Systems System review is negative for any constitutional, cardiac, pulmonary, GI or neuro symptoms or complaints other than as described in the HPI.     Objective:   Physical Exam Filed Vitals:   06/26/11 1142  BP: 126/62  Pulse: 79  Temp: 97.6 F (36.4 C)  Resp: 22   Wt Readings from Last 3 Encounters:  06/26/11 175 lb 8 oz (79.606 kg)  05/28/11 172 lb 3.2 oz (78.109 kg)  05/27/11 169 lb 4 oz (76.771 kg)   Gen'l- elderly, chronically ill appearing white man in no acute distress, on O2. Able to get up to exam table w/o assist. HEENT - [place]/at Cor - 2+ radial pulse, RRR PUlm - mild increased work of breathing, coarse rhonchi throughout, dullness to percussion/petriloquy both basis, negative for egophony. Abd -  BS +, no guarding or  rebound, no masses, no HSM Genitalia deferred Ext - no deformity, no significant peripheral edema Nuero - A&O x 3, CN II-XII grossly intact Derm - no lesions  Lab Results  Component Value Date   WBC 9.0 06/26/2011   HGB 14.3 06/26/2011   HCT 43.5 06/26/2011   PLT 213.0 06/26/2011   GLUCOSE 134* 06/26/2011   CHOL 125 05/01/2011   TRIG 93.0 05/01/2011   HDL 59.30 05/01/2011   LDLCALC 47 05/01/2011   ALT 20 06/26/2011   AST 20 06/26/2011   NA 141 06/26/2011   K 4.4 06/26/2011   CL 99 06/26/2011   CREATININE 1.0 06/26/2011   BUN 19 06/26/2011   CO2 33* 06/26/2011   TSH 1.60 07/30/2010   PSA 0.75 04/29/2006   INR 1.1 10/29/2008   HGBA1C 7.0* 05/01/2011   MICROALBUR 3.4* 07/30/2010   Clinical Data: Cough, rule out infiltrate  CHEST - 2 VIEW  Comparison: 04/28/2011  Findings: Cardiomediastinal silhouette is stable. Stable  hyperinflation and chronic mild interstitial prominence. No acute  infiltrate or pleural effusion. No pulmonary edema.  Stable degenerative changes thoracic spine.  IMPRESSION:  No active disease. Stable COPD.  Original Report Authenticated By: Natasha Mead, M.D.         Assessment & Plan:

## 2011-06-27 ENCOUNTER — Inpatient Hospital Stay (HOSPITAL_COMMUNITY)
Admission: AD | Admit: 2011-06-27 | Discharge: 2011-06-30 | DRG: 191 | Disposition: A | Discharge status: Home Health | Payer: Medicare Other | Source: Ambulatory Visit | Attending: Internal Medicine | Admitting: Internal Medicine

## 2011-06-27 ENCOUNTER — Other Ambulatory Visit: Payer: Self-pay | Admitting: Internal Medicine

## 2011-06-27 DIAGNOSIS — Z9861 Coronary angioplasty status: Secondary | ICD-10-CM

## 2011-06-27 DIAGNOSIS — F0390 Unspecified dementia without behavioral disturbance: Secondary | ICD-10-CM | POA: Diagnosis present

## 2011-06-27 DIAGNOSIS — I252 Old myocardial infarction: Secondary | ICD-10-CM

## 2011-06-27 DIAGNOSIS — J449 Chronic obstructive pulmonary disease, unspecified: Secondary | ICD-10-CM | POA: Diagnosis present

## 2011-06-27 DIAGNOSIS — J309 Allergic rhinitis, unspecified: Secondary | ICD-10-CM | POA: Diagnosis present

## 2011-06-27 DIAGNOSIS — J9610 Chronic respiratory failure, unspecified whether with hypoxia or hypercapnia: Secondary | ICD-10-CM | POA: Diagnosis present

## 2011-06-27 DIAGNOSIS — I4891 Unspecified atrial fibrillation: Secondary | ICD-10-CM | POA: Diagnosis present

## 2011-06-27 DIAGNOSIS — J441 Chronic obstructive pulmonary disease with (acute) exacerbation: Principal | ICD-10-CM | POA: Diagnosis present

## 2011-06-27 DIAGNOSIS — E7800 Pure hypercholesterolemia, unspecified: Secondary | ICD-10-CM | POA: Diagnosis present

## 2011-06-27 DIAGNOSIS — Z96649 Presence of unspecified artificial hip joint: Secondary | ICD-10-CM

## 2011-06-27 DIAGNOSIS — G4733 Obstructive sleep apnea (adult) (pediatric): Secondary | ICD-10-CM | POA: Diagnosis present

## 2011-06-27 DIAGNOSIS — I10 Essential (primary) hypertension: Secondary | ICD-10-CM | POA: Diagnosis present

## 2011-06-27 DIAGNOSIS — I2510 Atherosclerotic heart disease of native coronary artery without angina pectoris: Secondary | ICD-10-CM | POA: Diagnosis present

## 2011-06-27 DIAGNOSIS — E119 Type 2 diabetes mellitus without complications: Secondary | ICD-10-CM | POA: Diagnosis present

## 2011-06-27 LAB — GLUCOSE, CAPILLARY
Glucose-Capillary: 85 mg/dL (ref 70–99)
Glucose-Capillary: 131 mg/dL — ABNORMAL HIGH (ref 70–99)

## 2011-06-27 MED ORDER — METHYLPREDNISOLONE SODIUM SUCC 125 MG IJ SOLR
80.0000 mg | Freq: Two times a day (BID) | INTRAMUSCULAR | Status: DC
  Administered 2011-06-28 (×2): 80 mg via INTRAVENOUS
  Filled 2011-06-27 (×3): qty 1.28
  Filled 2011-06-27: qty 2
  Filled 2011-06-27 (×2): qty 1.28

## 2011-06-27 MED ORDER — MEMANTINE HCL 10 MG PO TABS
10.0000 mg | ORAL_TABLET | Freq: Two times a day (BID) | ORAL | Status: DC
  Administered 2011-06-28 – 2011-06-30 (×6): 10 mg via ORAL
  Filled 2011-06-27 (×7): qty 1

## 2011-06-27 MED ORDER — SERTRALINE HCL 50 MG PO TABS
50.0000 mg | ORAL_TABLET | Freq: Every day | ORAL | Status: DC
  Administered 2011-06-28 – 2011-06-29 (×3): 50 mg via ORAL
  Filled 2011-06-27 (×4): qty 1

## 2011-06-27 MED ORDER — BUDESONIDE 0.5 MG/2ML IN SUSP
0.5000 mg | Freq: Two times a day (BID) | RESPIRATORY_TRACT | Status: DC
  Administered 2011-06-28 – 2011-06-30 (×4): 0.5 mg via RESPIRATORY_TRACT
  Filled 2011-06-27 (×9): qty 2

## 2011-06-27 MED ORDER — MOXIFLOXACIN HCL 400 MG PO TABS
400.0000 mg | ORAL_TABLET | Freq: Every day | ORAL | Status: DC
  Administered 2011-06-28 – 2011-06-29 (×2): 400 mg via ORAL
  Filled 2011-06-27 (×3): qty 1

## 2011-06-27 MED ORDER — ASPIRIN EC 81 MG PO TBEC
162.0000 mg | DELAYED_RELEASE_TABLET | Freq: Every day | ORAL | Status: DC
  Administered 2011-06-28 – 2011-06-30 (×3): 162 mg via ORAL
  Filled 2011-06-27 (×3): qty 2

## 2011-06-27 MED ORDER — ENOXAPARIN SODIUM 40 MG/0.4ML ~~LOC~~ SOLN
40.0000 mg | SUBCUTANEOUS | Status: DC
  Administered 2011-06-28 – 2011-06-29 (×3): 40 mg via SUBCUTANEOUS
  Filled 2011-06-27 (×4): qty 0.4

## 2011-06-27 MED ORDER — ALBUTEROL SULFATE (5 MG/ML) 0.5% IN NEBU
2.5000 mg | INHALATION_SOLUTION | Freq: Three times a day (TID) | RESPIRATORY_TRACT | Status: DC
  Administered 2011-06-27 – 2011-06-30 (×8): 2.5 mg via RESPIRATORY_TRACT
  Filled 2011-06-27 (×8): qty 0.5

## 2011-06-27 MED ORDER — INSULIN ASPART 100 UNIT/ML ~~LOC~~ SOLN
0.0000 [IU] | Freq: Three times a day (TID) | SUBCUTANEOUS | Status: DC
  Administered 2011-06-28: 7 [IU] via SUBCUTANEOUS
  Administered 2011-06-28: 4 [IU] via SUBCUTANEOUS
  Administered 2011-06-29: 3 [IU] via SUBCUTANEOUS
  Administered 2011-06-29: 11 [IU] via SUBCUTANEOUS
  Administered 2011-06-29: 7 [IU] via SUBCUTANEOUS
  Administered 2011-06-30: 11 [IU] via SUBCUTANEOUS

## 2011-06-27 MED ORDER — SENNOSIDES-DOCUSATE SODIUM 8.6-50 MG PO TABS: 1.0000 | ORAL_TABLET | Freq: Every evening | ORAL | Status: DC | PRN

## 2011-06-27 MED ORDER — INSULIN ASPART 100 UNIT/ML ~~LOC~~ SOLN
0.0000 [IU] | Freq: Every day | SUBCUTANEOUS | Status: DC
  Administered 2011-06-29: 2 [IU] via SUBCUTANEOUS

## 2011-06-27 MED ORDER — IPRATROPIUM BROMIDE 0.02 % IN SOLN
0.5000 mg | Freq: Three times a day (TID) | RESPIRATORY_TRACT | Status: DC
  Administered 2011-06-27 – 2011-06-30 (×8): 0.5 mg via RESPIRATORY_TRACT
  Filled 2011-06-27 (×8): qty 2.5

## 2011-06-27 MED ORDER — DONEPEZIL HCL 10 MG PO TABS
10.0000 mg | ORAL_TABLET | Freq: Every day | ORAL | Status: DC
  Administered 2011-06-28 – 2011-06-29 (×3): 10 mg via ORAL
  Filled 2011-06-27 (×4): qty 1

## 2011-06-27 MED ORDER — HYDROCODONE-HOMATROPINE 5-1.5 MG/5ML PO SYRP
5.0000 mL | ORAL_SOLUTION | Freq: Four times a day (QID) | ORAL | Status: DC | PRN
  Administered 2011-06-29: 5 mL via ORAL
  Filled 2011-06-27: qty 5

## 2011-06-27 MED ORDER — SODIUM CHLORIDE 0.9 % IJ SOLN: 3.0000 mL | INTRAMUSCULAR | Status: DC | PRN

## 2011-06-27 MED ORDER — IPRATROPIUM-ALBUTEROL 0.5-2.5 (3) MG/3ML IN SOLN: 3.0000 mL | RESPIRATORY_TRACT | Status: DC

## 2011-06-27 MED ORDER — ATORVASTATIN CALCIUM 40 MG PO TABS
40.0000 mg | ORAL_TABLET | Freq: Every day | ORAL | Status: DC
  Administered 2011-06-28 – 2011-06-29 (×2): 40 mg via ORAL
  Filled 2011-06-27 (×3): qty 1

## 2011-06-27 MED ORDER — ZOLPIDEM TARTRATE 5 MG PO TABS: 5.0000 mg | ORAL_TABLET | Freq: Every evening | ORAL | Status: DC | PRN

## 2011-06-27 MED ORDER — LOSARTAN POTASSIUM 50 MG PO TABS
100.0000 mg | ORAL_TABLET | Freq: Every day | ORAL | Status: DC
  Administered 2011-06-28 – 2011-06-30 (×3): 100 mg via ORAL
  Filled 2011-06-27 (×3): qty 2

## 2011-06-27 MED ORDER — SODIUM CHLORIDE 0.9 % IJ SOLN
3.0000 mL | Freq: Two times a day (BID) | INTRAMUSCULAR | Status: DC
  Administered 2011-06-28 (×2): 3 mL via INTRAVENOUS

## 2011-06-27 MED ORDER — ACETAMINOPHEN 500 MG PO TABS
1000.0000 mg | ORAL_TABLET | Freq: Every day | ORAL | Status: DC
  Administered 2011-06-28 – 2011-06-29 (×2): 1000 mg via ORAL
  Filled 2011-06-27 (×4): qty 2

## 2011-06-27 MED ORDER — CLOPIDOGREL BISULFATE 75 MG PO TABS
75.0000 mg | ORAL_TABLET | Freq: Every day | ORAL | Status: DC
  Administered 2011-06-28 – 2011-06-30 (×3): 75 mg via ORAL
  Filled 2011-06-27 (×4): qty 1

## 2011-06-27 NOTE — H&P (Signed)
Patient ID: [name], male DOB: 03/23/1925, 78 y.o. MRN: [id number]  CC: Continued weakness and exacerbation of SOB HPI  [name] is feeling bad. He has no stamina and he is very SOB. Question of wheezing. He has a deep wet cough occasionally productive of a greyish sputum.C/o hot water brash. C/o "diarrhea" 5-6 loose stools a day. No blood or mucus. Started immodium and that has helped. He has had borborygmi and bloating and distention. He has had low back pain in every position. No fever or chills. Pain is not positional, not constant. The pain is enough to keep him awake. He is been able to keep down lots of fluid. Blood sugars have been between 60 - 130. Last A1C 7% in Jan '13. He has seen Dr. Shelle Iron in February and was stable. Seen in the office 3/14 but has continued to do poorly and is very uncomfortable.  Past Medical History   Diagnosis  Date   .  Bradycardia    .  dementia    .  mass, lung right lilum      patient had an abnormal CT chest 2+ years ago after infection: changes thougth to be a reaction and did clear.   .  Shortness of breath    .  weakness    .  Chronic respiratory failure    .  Obstructive sleep apnea (adult) (pediatric)    .  allergi rhin, chronic    .  Emphysema    .  inguinal hernia, right    .  Other specified disorder of skin    .  COPD (chronic obstructive pulmonary disease)    .  varicose vein      mild   .  Hypertension    .  coronary artery disease    .  History of cardiac cath  06/03/04     acute info post MI, stent PICA   .  History of cardiac cath  06/03/04     acute info post MI, stent PICA   .  Asthma    .  diabetes mell, type 2    .  Pure hypercholesterolemia     Past Surgical History   Procedure  Date   .  Cholecystectomy  01/26/03-01/31/2003     [facility] ERCP, cholecystitis   .  Stress/adenosine myoview  03/25/04     abnormal EF 50%   .  Total hip arthroplasty  02/06     Left, anemia 2 units PRBC   .  Adenosine myoview  08/13/05    EF 46%   .  Total hip arthroplasty  05/31/05     right   .  Ct angio chest  10/28/08     Neg PE, 3x3x2.2 cm right hilar mass   .  Pet scan  11/02/08     Inflam/Infect Atelect RUL w/reactive lymphadenopathy   .  Hernia repair  01/00     left recurrent    Family History   Problem  Relation  Age of Onset   .  Cancer  Sister       breast    .  Diabetes  Brother    .  Hypertension  Brother    .  Hyperlipidemia  Brother    .  Parkinsonism  Brother    .  Cancer  Sister     History    Social History   .  Marital Status:  Married     Spouse Name:  N/A     Number of Children:  2   .  Years of Education:  8    Occupational History   .  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      Social History Main Topics   .  Smoking status:  Former Smoker -- 1.0 packs/day for 25 years     Types:  Cigarettes     Quit date:  08/12/1960   .  Smokeless tobacco:  Never Used   .  Alcohol Use:  No   .  Drug Use:  No   .  Sexually Active:  Not Currently    Other Topics  Concern   .  Not on file    Social History Narrative    Finished 8th grade. Married - 1946 - . 1 son - '37; 1 dtr - '55; 3 grandchildren. 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 Lives alone with wife. End-of-Life Measures: no Cardiac resuscitation, no mechanical ventilation, no heroic or futile measures.    Review of Systems  System review is negative for any constitutional, cardiac, pulmonary, GI or neuro symptoms or complaints other than as described in the HPI.   Objective:   Physical Exam  Filed Vitals:    06/26/11 1142   BP:  126/62   Pulse:  79   Temp:  97.6 F (36.4 C)   Resp:  22    Wt Readings from Last 3 Encounters:   06/26/11  175 lb 8 oz (79.606 kg)   05/28/11  172 lb 3.2 oz (78.109 kg)   05/27/11  169 lb 4 oz (76.771 kg)    Gen'l- elderly, chronically ill appearing white man in no acute distress, on  O2. Able to get up to exam table w/o assist.  HEENT - [place]/at  Cor - 2+ radial pulse, RRR  PUlm - mild increased work of breathing, coarse rhonchi throughout, dullness to percussion/petriloquy both basis, negative for egophony.  Abd - BS +, no guarding or rebound, no masses, no HSM  Genitalia deferred  Ext - no deformity, no significant peripheral edema  Nuero - A&O x 3, CN II-XII grossly intact  Derm - no lesions  Lab Results   Component  Value  Date    WBC  9.0  06/26/2011    HGB  14.3  06/26/2011    HCT  43.5  06/26/2011    PLT  213.0  06/26/2011    GLUCOSE  134*  06/26/2011    CHOL  125  05/01/2011    TRIG  93.0  05/01/2011    HDL  59.30  05/01/2011    LDLCALC  47  05/01/2011    ALT  20  06/26/2011    AST  20  06/26/2011    NA  141  06/26/2011    K  4.4  06/26/2011    CL  99  06/26/2011    CREATININE  1.0  06/26/2011    BUN  19  06/26/2011    CO2  33*  06/26/2011    TSH  1.60  07/30/2010    PSA  0.75  04/29/2006    INR  1.1  10/29/2008    HGBA1C  7.0*  05/01/2011    MICROALBUR  3.4*  07/30/2010    Clinical Data: Cough, rule out infiltrate  CHEST - 2 VIEW  Comparison: 04/28/2011  Findings: Cardiomediastinal silhouette is stable. Stable  hyperinflation and chronic mild interstitial prominence. No acute  infiltrate or pleural effusion. No pulmonary edema.  Stable degenerative changes thoracic spine.  IMPRESSION:  No active disease. Stable COPD.  Original Report Authenticated By: Natasha Mead, M.D.  Assessment/Plan  1. Pulm - patient with an exacerbation of COPD/emphysema with increased work of breathing and discomfort. No evidence of pneumonia by x-ray and normal WBC. Suspect bronchitic exacerbation of underlying disease. Plan - reg admission           Oral Avelox to cover for possible bacterial bronchitis           IV steroids - solumedrol 80 mg q12           Continue home regimen  2. Cardiac - stable atrial fibrillation. No a candidate for coumadin due to fall risk. Plan - continue home  meds  3. DM - will hold oral sulfonylurea Plan - sliding scale coverage

## 2011-06-27 NOTE — Assessment & Plan Note (Signed)
Patient with increased difficulty breathing. His labs do not reveal evidence of bacterial infection; CXR w/o infiltrate. Most likely bronchitic (viral) exacerbation of emphysema/COPD.  Plan - will admit to Hospital for IV solumedrol, oral antibiotics           Continue home regimen

## 2011-06-27 NOTE — Assessment & Plan Note (Signed)
No real change - family provides excellent care. He is not agitated in any way.

## 2011-06-27 NOTE — Assessment & Plan Note (Signed)
Lab Results  Component Value Date   HGBA1C 7.0* 05/01/2011   Continue present regimen.

## 2011-06-28 ENCOUNTER — Encounter (HOSPITAL_COMMUNITY): Payer: Self-pay | Admitting: *Deleted

## 2011-06-28 DIAGNOSIS — J438 Other emphysema: Secondary | ICD-10-CM

## 2011-06-28 LAB — GLUCOSE, CAPILLARY

## 2011-06-28 LAB — HEMOGLOBIN A1C
Hgb A1c MFr Bld: 6.5 % — ABNORMAL HIGH (ref <5.7)
Mean Plasma Glucose: 140 mg/dL — ABNORMAL HIGH (ref <117)

## 2011-06-28 NOTE — Progress Notes (Signed)
Subjective: [name] was admitted due to increased SOB/DOE per family's report.  Sitting on the side of the bed. IN no distress and has no complaints  Objective: Lab: Lab Results  Component Value Date   WBC 9.0 06/26/2011   HGB 14.3 06/26/2011   HCT 43.5 06/26/2011   MCV 96.2 06/26/2011   PLT 213.0 06/26/2011   BMET    Component Value Date/Time   NA 141 06/26/2011 1219   K 4.4 06/26/2011 1219   CL 99 06/26/2011 1219   CO2 33* 06/26/2011 1219   GLUCOSE 134* 06/26/2011 1219   BUN 19 06/26/2011 1219   CREATININE 1.0 06/26/2011 1219   CALCIUM 9.8 06/26/2011 1219   GFRNONAA 57* 04/28/2011 2340   GFRAA 66* 04/28/2011 2340      Imaging:   Physical Exam: Filed Vitals:   06/28/11 0703  BP: 120/76  Pulse: 76  Temp: 98.6 F (37 C)  Resp: 18  oxygen saturatioin 3 l 96% Gen'l- elderly white man in no distress HEENT - C&S clear Neck- supple Nodes - negative at cervical region Pulm - no increased work of breathing. Coarse rhonchi worse at right base. Very feint end-expiratory wheeze. Cor - IRIR rate controlled Psych - calm, oriented to examiner and place    Assessment/Plan: 1. Pulm - bronchitic exacerbation of COPD/emphysema. Good response to IV solumedrol - #2 Avelox. Plan - continue steroids at present dose and reassess in AM  2. Cardiac - stable  3. DM stableon sliding scale  4.- Dementia - stable  Called daughter     Illene Regulus 06/28/2011, 8:34 AM

## 2011-06-29 LAB — GLUCOSE, CAPILLARY
Glucose-Capillary: 194 mg/dL — ABNORMAL HIGH (ref 70–99)
Glucose-Capillary: 274 mg/dL — ABNORMAL HIGH (ref 70–99)
Glucose-Capillary: 141 mg/dL — ABNORMAL HIGH (ref 70–99)
Glucose-Capillary: 201 mg/dL — ABNORMAL HIGH (ref 70–99)

## 2011-06-29 MED ORDER — PREDNISONE 20 MG PO TABS
30.0000 mg | ORAL_TABLET | Freq: Two times a day (BID) | ORAL | Status: DC
  Administered 2011-06-29 – 2011-06-30 (×3): 30 mg via ORAL
  Filled 2011-06-29 (×5): qty 1

## 2011-06-29 NOTE — Progress Notes (Signed)
Patient pulled IV out overnight, per night shift RN.  Pt is very confused this AM, stating "he wants to go home".  Pt has IV solu-medrol due.  MD notified.  MD stated that patient's attending will be rounding on pt shortly, hold on starting new IV until then.  Will continue to monitor.

## 2011-06-29 NOTE — Progress Notes (Signed)
Subjective: Patient is more confused today - not aware of where he is. He recognizes this examiner but can't really identify where he knows me from. He is in no distress.  Objective: Lab: no new lab  Imaging: no new imaging   Physical Exam: Filed Vitals:   06/29/11 0657  BP: 102/60  Pulse: 68  Temp: 99.8 F (37.7 C)  Resp: 18  Elderly white man in no acute distress but very confused. HEENT- normal Pulm - no increased work of breathing. No wheezing on exam. Cor - RRR Neuro/Psych - very confused but pleasant    Assessment/Plan: 1. Pulm - no fever, no cough. Exam w/o wheezing. Avelox #3. Has received 3 doses of IV solumedrol but has pulled the IV out. Plan - continue Avelox           Change to PO prednisone 30 mg bid  2-4 - stable.  PT eval pending.  If he does well anticipate d/c in AM   Taniyah Ballow 06/29/2011, 9:59 AM

## 2011-06-29 NOTE — Progress Notes (Signed)
Physical Therapy Evaluation Patient Details Name: [name] MRN: [id number] DOB: 30-Mar-1925 Today's Date: 06/29/2011  Problem List:  Patient Active Problem List  Diagnoses  . DIABETES MELLITUS, TYPE II  . PURE HYPERCHOLESTEROLEMIA  . HYPERLIPIDEMIA-MIXED  . DEMENTIA  . OBSTRUCTIVE SLEEP APNEA  . HYPERTENSION  . CORONARY ARTERY DISEASE  . BRADYCARDIA  . HEMORRHOIDS  . EMPHYSEMA  . CHRONIC RESPIRATORY FAILURE  . INGUINAL HERNIA, RIGHT  . WEAKNESS  . Nonspecific (abnormal) findings on radiological and other examination of body structure  . Agitation  . Cough    Past Medical History:  Past Medical History  Diagnosis Date  . Bradycardia   . dementia   . mass, lung right lilum     patient had an abnormal CT chest 2+ years ago after infection: changes thougth to be a reaction and did clear.   . Shortness of breath   . weakness   . Chronic respiratory failure   . Obstructive sleep apnea (adult) (pediatric)   . allergi rhin, chronic   . Emphysema   . inguinal hernia, right   . Other specified disorder of skin   . COPD (chronic obstructive pulmonary disease)   . varicose vein     mild  . Hypertension   . coronary artery disease   . History of cardiac cath 06/03/04    acute info post MI, stent PICA  . History of cardiac cath 06/03/04    acute info post MI, stent PICA  . Asthma   . diabetes mell, type 2   . Pure hypercholesterolemia    Past Surgical History:  Past Surgical History  Procedure Date  . Cholecystectomy 01/26/03-01/31/2003     [facility] ERCP, cholecystitis  . Stress/adenosine myoview 03/25/04    abnormal EF 50%  . Total hip arthroplasty 02/06    Left, anemia 2 units PRBC  . Adenosine myoview 08/13/05    EF 46%  . Total hip arthroplasty 05/31/05    right  . Ct angio chest 10/28/08    Neg PE, 3x3x2.2 cm right hilar mass  . Pet scan 11/02/08    Inflam/Infect Atelect RUL w/reactive lymphadenopathy  . Hernia repair 01/00    left recurrent    PT  Assessment/Plan/Recommendation PT Assessment Clinical Impression Statement: 78 yo male admitted with SOB, bronchitis presents with decr activity tolerance; will benefit from PT to maximize independence and safety with mobility, amb, steps and enable safe dc home;  Home with HHPT follow up will likely be the most therapeutic place for pt;  Worth also looking into getting a HHAide as pt has decr activity tol, and his wife is dependent on a RW for ambulation PT Recommendation/Assessment: Patient will need skilled PT in the acute care venue PT Problem List: Decreased strength;Decreased activity tolerance;Decreased mobility PT Therapy Diagnosis : Difficulty walking;Generalized weakness PT Plan PT Frequency: Min 3X/week PT Treatment/Interventions: DME instruction;Gait training;Stair training;Functional mobility training;Therapeutic activities;Therapeutic exercise;Balance training;Patient/family education PT Recommendation Recommendations for Other Services: OT consult Follow Up Recommendations: Home health PT;Supervision - Intermittent Equipment Recommended: None recommended by PT PT Goals  Acute Rehab PT Goals PT Goal Formulation: With patient Time For Goal Achievement: 2 weeks Pt will go Supine/Side to Sit: with modified independence PT Goal: Supine/Side to Sit - Progress: Goal set today Pt will go Sit to Supine/Side: with modified independence PT Goal: Sit to Supine/Side - Progress: Goal set today Pt will go Sit to Stand: with modified independence PT Goal: Sit to Stand - Progress: Goal set today  Pt will go Stand to Sit: with modified independence PT Goal: Stand to Sit - Progress: Goal set today Pt will Ambulate: >150 feet;with least restrictive assistive device;Other (comment);with supervision (versus none) PT Goal: Ambulate - Progress: Goal set today Pt will Go Up / Down Stairs: 3-5 stairs;with supervision;with rail(s) PT Goal: Up/Down Stairs - Progress: Goal set today  PT  Evaluation Precautions/Restrictions  Precautions Precautions:  (slight fall risk) Precaution Comments: Use supplemental O2; Desatted on 3 liters Prior Functioning  Home Living Lives With: Spouse (she is on a RW herself) Receives Help From: Family (adult children rotate days to check on pt and wife) Type of Home: House Home Layout: One level Home Access: Stairs to enter Entrance Stairs-Rails: Right (also ramp at back door) Entrance Stairs-Number of Steps: 1 (side door has a few more) Prior Function Level of Independence: Independent with basic ADLs Able to Take Stairs?: Yes Vocation: Retired Financial risk analyst Arousal/Alertness: Awake/alert Overall Cognitive Status: History of cognitive impairments History of Cognitive Impairment: Appears at baseline functioning Orientation Level: Oriented to person;Oriented to place;Oriented to situation Sensation/Coordination Sensation Light Touch: Appears Intact Coordination Gross Motor Movements are Fluid and Coordinated: Yes Fine Motor Movements are Fluid and Coordinated: Not tested Extremity Assessment RUE Assessment RUE Assessment: Within Functional Limits LUE Assessment LUE Assessment: Within Functional Limits RLE Assessment RLE Assessment: Exceptions to [facility] RLE Strength RLE Overall Strength Comments: Grossly decr AROM and strength, requiring UE support to control descent LLE Assessment LLE Assessment: Exceptions to [facility] LLE Strength LLE Overall Strength Comments: Grossly decr AROM and strength, requiring UE support to control descent Mobility (including Balance) Bed Mobility Bed Mobility: Yes Transfers Transfers: Yes Sit to Stand: 4: Min assist;From chair/3-in-1;With armrests Sit to Stand Details (indicate cue type and reason): cues for safety, control Stand to Sit: 4: Min assist;To chair/3-in-1 Stand to Sit Details: cues to control descent; depndent on UEs for control Ambulation/Gait Ambulation/Gait: Yes Ambulation/Gait  Assistance: 4: Min assist Ambulation/Gait Assistance Details (indicate cue type and reason): Initially required UE support (on furniture in room, or Handheld assist), but overall, pretty steady with amb; Amb distance limited by a coughing spell in which pt's HR increased to in the 130s, and O2 sats decr to mid 80s (on 3 liters O2); RN aware Ambulation Distance (Feet): 50 Feet Assistive device: None Gait Pattern: Within Functional Limits     End of Session PT - End of Session Equipment Utilized During Treatment:  (O2) Activity Tolerance: Patient limited by fatigue Patient left: in chair;with call bell in reach Nurse Communication: Mobility status for transfers;Mobility status for ambulation General Behavior During Session: [facility] for tasks performed Cognition: Impaired, at baseline  Van Clines [facility] [place], [place] [phone]  06/29/2011, 1:01 PM

## 2011-06-30 MED ORDER — MOXIFLOXACIN HCL 400 MG PO TABS: 400.0000 mg | ORAL_TABLET | Freq: Every day | ORAL | Status: AC

## 2011-06-30 MED ORDER — PREDNISONE 10 MG PO TABS: 30.0000 mg | ORAL_TABLET | Freq: Two times a day (BID) | ORAL | Status: AC

## 2011-06-30 NOTE — Progress Notes (Signed)
Inpatient Diabetes Program Recommendations  AACE/ADA: New Consensus Statement on Inpatient Glycemic Control (2009)  Target Ranges:  Prepandial:   less than 140 mg/dL      Peak postprandial:   less than 180 mg/dL (1-2 hours)      Critically ill patients:  140 - 180 mg/dL   Reason for Visit: Fasting hyperglycemia  Inpatient Diabetes Program Recommendations Insulin - Basal: Please add Lantus 10-15 units daily or HS while on any dose Prednisone greater than 10 mg/day.   Pt 's cbg's in 200's in the am.  Note: Thank you, Lenor Coffin, RN, CNS, Diabetes Coordinator [phone])

## 2011-06-30 NOTE — Discharge Summary (Signed)
[name], [name] NO.:  [id number]  MEDICAL RECORD NO.:  [id number]  LOCATION:  5029                         FACILITY:  [facility]  PHYSICIAN:  Rosalyn Gess. Nichele Slawson, MD  DATE OF BIRTH:  [birth year]  DATE OF ADMISSION:  06/27/2011 DATE OF DISCHARGE:  06/30/2011                              DISCHARGE SUMMARY   ADMITTING DIAGNOSES: 1. Bronchitic exacerbation of chronic obstructive pulmonary disease. 2. Atrial fibrillation, stable. 3. Diabetes, stable.  DISCHARGE DIAGNOSES: 1. Bronchitic exacerbation of chronic obstructive pulmonary disease.     2. Atrial fibrillation, stable. 2. Diabetes, stable.  CONSULTANTS:  None.  PROCEDURES:  The patient had chest x-ray on June 26, 2011, which showed stable hyperinflation and chronic mild interstitial prominence with no acute infiltrate or pleural effusion.  HISTORY OF PRESENT ILLNESS:  [name] is an 78 year old gentleman with oxygen-dependent COPD/emphysema.  He was seen in the office on March 14, for increasing weakness, decreased stamina, increasing shortness of breath with a question of increased wheezing.  He had a deep wet cough, it is occasionally productive of grayish sputum.  He complained of hot water brash.  The patient complained of loose stools 5-6 times a day but no blood or mucus was reported.  Imodium was started and this had helped.  He has had increased borborygmi, bloating, and distention.  He has had low back pain in every position.  He denied fevers or chills. Pain was not positional.  The patient had been able to keep down fluids. Blood sugars by family reported being running between 60 and 130.  Last A1c was 7% in January 2013.  He had been seen by Dr. Shelle Iron, in February and was thought to be stable.  The patient at the time of his office visit was sent for chest x-ray which was unrevealing.  White count was normal with a normal differential.  The patient continued to have difficulty with shortness  of breath and decreased activity and therefore was admitted for IV steroids and antibiotics.  Please see the H and P for past medical history, family history, social history, and medications at admission.  HOSPITAL COURSE: 1. Pulmonary/ID.  The patient was started on Avelox 400 mg p.o. daily     for possible bronchitic exacerbation of his COPD.  He was put to     Solu-Medrol 80 mg q.12 h.  After 3 doses, he pulled out his IV line     and was switched to prednisone 30 mg b.i.d.  On this regimen, the     patient improved.  His breathing became unlabored.  He cleared with     no wheezing or rales or rhonchi.  He was able to move about his     room without significant shortness of breath.  The patient was seen     by physical therapy who thought he would benefit from home physical     therapy for improving his mobility and strength.  With the patient     being respiratory stable with good oxygen saturations and normal     pulmonary exam, he is thought to be stable and ready for discharge     to  home. 2. Cardiac.  The patient remained stable in atrial fibrillation. 3. Diabetes.  The patient's oral sulfonylurea was held.  He was     covered with sliding scale, but at this point, he is doing well.     He is taking a diet.  He will resume his home medications. 4. With the patient's acute medical problem being stable with physical     therapy evaluation being complete, he is now ready for discharge to     home with home health PT and nursing.  DISCHARGE EXAMINATION:  VITAL SIGNS: Temperature was 97.4, blood pressure 119/64, pulse 67, respirations were 18, oxygen saturation was 98% on oxygen. GENERAL APPEARANCE: This is a pleasant elderly Caucasian gentleman, in no acute distress, sitting up in a chair, in no distress. HEENT: Unremarkable. CHEST: Patient is moving air well.  There are no rales or wheezes were appreciated.  There is no increased work of breathing. CARDIOVASCULAR:  2+ radial  pulses.  Precordium was quiet with an irregularly irregular rhythm that was rate controlled.  NEUROLOGIC: Patient is awake, he is oriented to person and place.  FINAL LABORATORY:  The patient had no repeat labs since the 14th except capillary blood glucoses which were running in the 200 range, most likely exacerbated by steroids.  DISPOSITION:  Patient is discharged home.  We will arrange for home health PT and nursing.  The patient will continue all of his home medications.  CONDITION:  The patient's condition at the time of discharge dictation is stable with a guarded prognosis given his advanced age and multiple medical problems.     Rosalyn Gess Shantay Sonn, MD     MEN/MEDQ  D:  06/30/2011  T:  06/30/2011  Job:  [id number]

## 2011-06-30 NOTE — Progress Notes (Addendum)
Received order for HHPT and HHRN per pt RN, spoke with pt family who state that they have used [place] for [facility] in the past and wish to use that agency again. [employer] notified, pt has DME. No other needs identified.  Johny Shock RN MPH Case Manager [phone]      CARE MANAGEMENT NOTE 06/30/2011  Patient:  [name], [name]   Account Number:  [id number]  Date Initiated:  06/30/2011  Documentation initiated by:  Johny Shock  Subjective/Objective Assessment:   Request for HHPT and HHRN     Action/Plan:   Spoke with pt family who wish to use [place] for [facility] services , as they have used that agency previously and were pleased with the care.   Anticipated DC Date:  06/30/2011   Anticipated DC Plan:  HOME W HOME HEALTH SERVICES         [facility]  HOME HEALTH   Choice offered to / List presented to:  C-4 Adult Children        HH arranged  HH-1 RN  HH-2 PT      Status of service:  Completed, signed off Medicare Important Message given?   (If response is "NO", the following Medicare IM given date fields will be blank) Date Medicare IM given:   Date Additional Medicare IM given:    Discharge Disposition:  HOME W HOME HEALTH SERVICES  Per UR Regulation:    If discussed at Long Length of Stay Meetings, dates discussed:    Comments:  06/30/2011 Per family pt has DME. Johny Shock RN MPH Case manager [phone]

## 2011-06-30 NOTE — Progress Notes (Signed)
Patient stable with no labored respirations. Ready for d/c to home with home health.  Dictation # [id number]

## 2011-07-03 ENCOUNTER — Ambulatory Visit (INDEPENDENT_AMBULATORY_CARE_PROVIDER_SITE_OTHER): Payer: Medicare Other | Admitting: Internal Medicine

## 2011-07-03 ENCOUNTER — Encounter: Payer: Self-pay | Admitting: Internal Medicine

## 2011-07-03 VITALS — BP 108/59 | HR 75 | Temp 97.8°F | Resp 18 | Wt 172.0 lb

## 2011-07-03 DIAGNOSIS — J9610 Chronic respiratory failure, unspecified whether with hypoxia or hypercapnia: Secondary | ICD-10-CM

## 2011-07-03 DIAGNOSIS — I10 Essential (primary) hypertension: Secondary | ICD-10-CM

## 2011-07-03 MED ORDER — SAXAGLIPTIN HCL 5 MG PO TABS: 5.0000 mg | ORAL_TABLET | Freq: Every day | ORAL | Status: DC

## 2011-07-03 NOTE — Patient Instructions (Signed)
COPD with asthmatic component. Doing OK. As the prednisone dose goes down watch for increased breathing trouble or increased wheezing. It that happens we can prolong the steroid taper.  Diabetes - increased blood sugars due to steroids. In general it is safer to be off the amaryl, a sulfonylurea, and to start a DPP4 drug called onglyza 5 mg once a day.  Continue all other medications.   Return for follow-up in about a month. Sooner if needed.

## 2011-07-03 NOTE — Progress Notes (Signed)
  Subjective:    Patient ID: LANTZ HERMANN, male    DOB: [birth year], 78 y.o.   MRN: [id number]  HPI Mr. Krupinski was hospitalized March 15- 18th for a bronchitic exacerbation of his COPD. Hospital records, labs reviewed. He was treated with avelox, now completed, and IV steroids x 3 doses then switched to oral prednisone.  Since being home he has been doing ok: still gets SOB with exertion but is resting well and feeling better. He is finishing a slow prednisone taper.  PMH, FamHx and SocHx reviewed for any changes and relevance.    Review of Systems System review is negative for any constitutional, cardiac, pulmonary, GI or neuro symptoms or complaints other than as described in the HPI.     Objective:   Physical Exam Filed Vitals:   07/03/11 1058  BP: 108/59  Pulse: 75  Temp: 97.8 F (36.6 C)  Resp: 18   Gen'l;- Elderly white man in no distress - on O2. Cor- RRR Pulm  Shallow inspiration, no increased WOB, no wheezing.       Assessment & Plan:

## 2011-07-04 NOTE — Assessment & Plan Note (Signed)
Recent bronchitic exacerbation with short in -patient stay. He is doing better now than before hospitalization but is still limited by his COPD with DOE.  Plan - complete prednisone taper.           If there is marked increase in symptoms/wheezing will consider chronic steroid treatment

## 2011-07-04 NOTE — Assessment & Plan Note (Signed)
BP Readings from Last 3 Encounters:  07/03/11 108/59  06/30/11 119/64  06/26/11 126/62   Stable - continue present medications

## 2011-07-08 ENCOUNTER — Telehealth: Payer: Self-pay

## 2011-07-08 NOTE — Telephone Encounter (Signed)
Cannot assess by phone. May have OV WEds or Thurs

## 2011-07-08 NOTE — Telephone Encounter (Signed)
RN called to report pt's vital signs stable but he does have some wheezing in the RLL with non productive cough. RN and family is concerned and requesting MD advisement.

## 2011-07-09 ENCOUNTER — Ambulatory Visit (INDEPENDENT_AMBULATORY_CARE_PROVIDER_SITE_OTHER)
Admission: RE | Admit: 2011-07-09 | Discharge: 2011-07-09 | Disposition: A | Discharge status: Home / Self Care | Payer: Medicare Other | Source: Ambulatory Visit | Attending: Internal Medicine | Admitting: Internal Medicine

## 2011-07-09 ENCOUNTER — Ambulatory Visit (INDEPENDENT_AMBULATORY_CARE_PROVIDER_SITE_OTHER): Payer: Medicare Other | Admitting: Internal Medicine

## 2011-07-09 ENCOUNTER — Ambulatory Visit: Payer: Medicare Other | Admitting: Internal Medicine

## 2011-07-09 ENCOUNTER — Encounter: Payer: Self-pay | Admitting: Internal Medicine

## 2011-07-09 VITALS — BP 86/52 | HR 85 | Temp 98.3°F | Resp 18 | Wt 177.5 lb

## 2011-07-09 DIAGNOSIS — R05 Cough: Secondary | ICD-10-CM

## 2011-07-09 DIAGNOSIS — J9610 Chronic respiratory failure, unspecified whether with hypoxia or hypercapnia: Secondary | ICD-10-CM

## 2011-07-09 DIAGNOSIS — J438 Other emphysema: Secondary | ICD-10-CM

## 2011-07-09 NOTE — Progress Notes (Signed)
  Subjective:    Patient ID: [name], male    DOB: May 07, 1924, 78 y.o.   MRN: [id number]  HPI Last seen 3/14. Presents acutely today for increased SOB, cough and wheezing. NO fever, no purulent sputum.  PMH, FamHx and SocHx reviewed for any changes and relevance.    Review of Systems System review is negative for any constitutional, cardiac, pulmonary, GI or neuro symptoms or complaints other than as described in the HPI.     Objective:   Physical Exam Filed Vitals:   07/09/11 1619  BP: 86/52  Pulse: 85  Temp: 98.3 F (36.8 C)  Resp: 18  O2 sat 92%  Gen'l - elderly white man in no distress Pulm - no rales or wheezing, no dullness to percussion or decreased BS Cor - RRR   CXR- images reviewed: no effusion, no infiltrate or acute change compared to 3/14 and to Jan '13      Assessment & Plan:

## 2011-07-11 DIAGNOSIS — J440 Chronic obstructive pulmonary disease with acute lower respiratory infection: Secondary | ICD-10-CM

## 2011-07-11 DIAGNOSIS — I2510 Atherosclerotic heart disease of native coronary artery without angina pectoris: Secondary | ICD-10-CM

## 2011-07-11 DIAGNOSIS — E119 Type 2 diabetes mellitus without complications: Secondary | ICD-10-CM

## 2011-07-11 DIAGNOSIS — R269 Unspecified abnormalities of gait and mobility: Secondary | ICD-10-CM

## 2011-07-12 NOTE — Assessment & Plan Note (Signed)
No acute findings or real change from last visit. No radiographic evidence of pulmonary edema, infiltrate or effusion.  Plan - continue present regimen, including oxygen therapy           Reassured family - he appears to be stable.

## 2011-07-15 DIAGNOSIS — Z0279 Encounter for issue of other medical certificate: Secondary | ICD-10-CM

## 2011-07-16 ENCOUNTER — Telehealth: Payer: Self-pay

## 2011-07-16 NOTE — Telephone Encounter (Signed)
Ok for social work consult re: community resources

## 2011-07-16 NOTE — Telephone Encounter (Signed)
Okey Regal called on behalf of pt's daughter requesting verbal order for social worker for community resources regarding pt dementia

## 2011-07-17 NOTE — Telephone Encounter (Signed)
Caller informed.

## 2011-07-22 ENCOUNTER — Telehealth: Payer: Self-pay | Admitting: Internal Medicine

## 2011-07-22 MED ORDER — SAXAGLIPTIN HCL 5 MG PO TABS: 5.0000 mg | ORAL_TABLET | Freq: Every day | ORAL | Status: DC

## 2011-07-22 NOTE — Telephone Encounter (Signed)
Done

## 2011-07-22 NOTE — Telephone Encounter (Signed)
onglyza 5 mg, #30, 1 po qd, refill 11

## 2011-07-22 NOTE — Telephone Encounter (Signed)
Per dau in law/bonnie requesting generic onglava for diabetes--was given samples and worked out fine.  [facility] [address] in [place]--[name] ph # [phone]

## 2011-07-24 ENCOUNTER — Other Ambulatory Visit: Payer: Self-pay | Admitting: Cardiovascular Disease

## 2011-07-24 DIAGNOSIS — Z0279 Encounter for issue of other medical certificate: Secondary | ICD-10-CM

## 2011-07-26 ENCOUNTER — Other Ambulatory Visit: Payer: Self-pay | Admitting: Cardiovascular Disease

## 2011-07-28 ENCOUNTER — Telehealth: Payer: Self-pay

## 2011-07-28 NOTE — Telephone Encounter (Signed)
[name] visited with pt 04/13 and is requesting an additional verbal order to re-visit pt in 1-2 weeks for follow up.

## 2011-07-31 ENCOUNTER — Telehealth: Payer: Self-pay | Admitting: *Deleted

## 2011-07-31 NOTE — Telephone Encounter (Signed)
Prior authorization request via [employer] for Onglyza 5 mg Denied on 04.18.13; decision faxed to pharmacy. Will address alternatives w/MEN when return to office/SLS

## 2011-08-04 NOTE — Telephone Encounter (Signed)
[facility] informed via VM

## 2011-08-04 NOTE — Telephone Encounter (Signed)
Ok to switch to tradjenta 5 mg daily, #30, refill 11

## 2011-08-04 NOTE — Telephone Encounter (Signed)
Ok for re-visit for follow-up

## 2011-08-04 NOTE — Telephone Encounter (Signed)
Prior authorization request via [employer] for Onglyza 5 mg Denied on 04.18.13; decision faxed to pharmacy.  Will address alternatives w/MEN when return to office/SLS  Alternative medications covered by Insurance: Januvia, Trajenta Please advise.

## 2011-08-05 ENCOUNTER — Ambulatory Visit: Payer: Medicare Other | Admitting: Internal Medicine

## 2011-08-05 MED ORDER — LINAGLIPTIN 5 MG PO TABS: 5.0000 mg | ORAL_TABLET | Freq: Every day | ORAL | Status: DC

## 2011-08-05 NOTE — Telephone Encounter (Signed)
Onglyza D/C for alternative Tradjenta 5 mg per MEN; new Rx sent to pharmacy. LMOM to inform patient w/contact name & number if call back needed/SLS

## 2011-08-12 ENCOUNTER — Encounter: Payer: Self-pay | Admitting: Internal Medicine

## 2011-08-12 ENCOUNTER — Ambulatory Visit (INDEPENDENT_AMBULATORY_CARE_PROVIDER_SITE_OTHER): Payer: Medicare Other | Admitting: Internal Medicine

## 2011-08-12 VITALS — BP 134/68 | HR 69 | Temp 97.4°F | Resp 16 | Wt 172.0 lb

## 2011-08-12 DIAGNOSIS — J438 Other emphysema: Secondary | ICD-10-CM

## 2011-08-12 NOTE — Progress Notes (Signed)
  Subjective:    Patient ID: [name], male    DOB: 02-Aug-1924, 78 y.o.   MRN: [id number]  HPI [name] presents for follow - up COPD, O2 dependent. He does have a healing abrasion left distal LE. He has been relatively stable. His daughter is concerned about the on-going needs of [name]. She and her husband are not as able to meet their needs as in the past. She has been referred to [facility] by their home health provider. We did discuss [facility] services and that Dr. Modena Jansky would become the PCP. She is also directed to [facility] for any additional services available through the network. It was explained that a change to [facility] would require a full change in Primary care, including any hospital care, but would not affect specialty care.  PMH, FamHx and SocHx reviewed for any changes and relevance.    Review of Systems System review is negative for any constitutional, cardiac, pulmonary, GI or neuro symptoms or complaints other than as described in the HPI.     Objective:   Physical Exam Filed Vitals:   08/12/11 1516  BP: 134/68  Pulse: 69  Temp: 97.4 F (36.3 C)  Resp: 16   gen'l- elderly white man in good humor and no distress. Wearing O2 Cor- RRR Pulm - no increased WOB, no rales, no wheezing Neuro - pleasantly demented       Assessment & Plan:  Change in care - I anticipate that [name] will be changing his care to [facility].

## 2011-08-13 NOTE — Assessment & Plan Note (Signed)
Stable on his present regimen.

## 2011-08-17 ENCOUNTER — Other Ambulatory Visit: Payer: Self-pay | Admitting: Internal Medicine

## 2011-08-17 ENCOUNTER — Other Ambulatory Visit: Payer: Self-pay | Admitting: Cardiovascular Disease

## 2011-08-18 ENCOUNTER — Other Ambulatory Visit: Payer: Self-pay | Admitting: *Deleted

## 2011-08-18 MED ORDER — GLIMEPIRIDE 4 MG PO TABS: 4.0000 mg | ORAL_TABLET | Freq: Every day | ORAL | Status: DC

## 2011-08-19 ENCOUNTER — Ambulatory Visit (INDEPENDENT_AMBULATORY_CARE_PROVIDER_SITE_OTHER): Payer: Medicare Other | Admitting: Cardiovascular Disease

## 2011-08-19 ENCOUNTER — Encounter: Payer: Self-pay | Admitting: Cardiovascular Disease

## 2011-08-19 ENCOUNTER — Other Ambulatory Visit: Payer: Self-pay | Admitting: Pulmonary Disease

## 2011-08-19 VITALS — BP 110/62 | HR 60 | Ht 69.0 in | Wt 171.0 lb

## 2011-08-19 DIAGNOSIS — E785 Hyperlipidemia, unspecified: Secondary | ICD-10-CM

## 2011-08-19 DIAGNOSIS — I498 Other specified cardiac arrhythmias: Secondary | ICD-10-CM

## 2011-08-19 DIAGNOSIS — I10 Essential (primary) hypertension: Secondary | ICD-10-CM

## 2011-08-19 DIAGNOSIS — J438 Other emphysema: Secondary | ICD-10-CM

## 2011-08-19 DIAGNOSIS — I2510 Atherosclerotic heart disease of native coronary artery without angina pectoris: Secondary | ICD-10-CM

## 2011-08-19 NOTE — Assessment & Plan Note (Signed)
Followed by [facility] in [place].

## 2011-08-19 NOTE — Assessment & Plan Note (Signed)
Blood pressure is well controlled on today's visit. No changes made to the medications. 

## 2011-08-19 NOTE — Patient Instructions (Signed)
You are doing well. No medication changes were made.  Please call us if you have new issues that need to be addressed before your next appt.  Your physician wants you to follow-up in: 12 months.  You will receive a reminder letter in the mail two months in advance. If you don't receive a letter, please call our office to schedule the follow-up appointment. 

## 2011-08-19 NOTE — Assessment & Plan Note (Signed)
Currently with no symptoms of angina. No further workup at this time. Continue current medication regimen. 

## 2011-08-19 NOTE — Assessment & Plan Note (Signed)
Cholesterol is at goal on the current lipid regimen. No changes to the medications were made.  

## 2011-08-19 NOTE — Progress Notes (Signed)
Patient ID: [name], male    DOB: 09/28/1924, 78 y.o.   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  He reports that overall he is feeling well. His daughter presents with him today and reports that he has continued chronic shortness of breath. The patient denies any problems including no chest pain, shortness of breath, edema.  He has been using a nebulizer machine at home. He reports having a upper respiratory infection in January 2013 with no significant complications. Blood pressure at home is well controlled. He is trying to manage his diet for better diabetes control. There has been some recent diabetes medication changes. He no longer takes Lasix or potassium and has no significant lower extremity edema. Breathing is about the same without Lasix over the past week.    EKG shows normal sinus rhythm with rate 60 beats per minute, no significant ST or T wave changes.     Outpatient Encounter Prescriptions as of 08/19/2011  Medication Sig Dispense Refill  . acetaminophen (TYLENOL) 500 MG tablet Take 1,000 mg by mouth at bedtime.        Marland Kitchen aspirin EC 81 MG EC tablet Take 162 mg by mouth daily.       . budesonide (PULMICORT) 0.5 MG/2ML nebulizer solution Take 0.5 mg by nebulization 2 (two) times daily.      . cholecalciferol (VITAMIN D) 1000 UNITS tablet Take 1,000 Units by mouth daily.        . clopidogrel (PLAVIX) 75 MG  tablet TAKE 1 TABLET EVERY DAY  30 tablet  1  . donepezil (ARICEPT) 10 MG tablet Take 10 mg by mouth at bedtime.      . fluticasone (FLONASE) 50 MCG/ACT nasal spray Place 2 sprays into the nose daily.      Marland Kitchen glimepiride (AMARYL) 4 MG tablet Take 1 tablet (4 mg total) by mouth daily before breakfast.  30 tablet  5  . HYDROcodone-homatropine (HYCODAN) 5-1.5 MG/5ML syrup Take 5 mLs by mouth every 6 (six) hours as needed. For cough      . ipratropium-albuterol (DUONEB) 0.5-2.5 (3) MG/3ML SOLN Take 3 mLs by nebulization 4 (four) times daily.      Marland Kitchen losartan (COZAAR) 50 MG tablet Take 50 mg by mouth daily.      . Melatonin 5 MG TABS Take 0.5 tablets by mouth at bedtime.       . memantine (NAMENDA) 10 MG tablet Take 10 mg by mouth 2 (two) times daily.      . Multiple Vitamin (MULTIVITAMIN) capsule Take 1 capsule by mouth daily. Chewable.      Marland Kitchen PROVENTIL HFA 108 (90 BASE) MCG/ACT inhaler USE 2 PUFFS EVERY 4 HOURS AS NEEDED  1 Inhaler  4  . sertraline (ZOLOFT) 50 MG tablet Take 50 mg by mouth at bedtime.      . simvastatin (ZOCOR) 80 MG tablet TAKE 1/2 TABLET BY MOUTH ONCE A DAY  30 tablet  3  . DISCONTD: glimepiride (AMARYL) 4 MG tablet TAKE 1 TABLET  EVERY DAY  90 tablet  3   Review of Systems  Constitutional: Negative.   HENT: Negative.   Eyes: Negative.   Respiratory: Positive for shortness of breath.   Cardiovascular: Negative.   Gastrointestinal: Negative.   Musculoskeletal: Negative.   Skin: Negative.   Neurological: Negative.   Hematological: Negative.   Psychiatric/Behavioral: Negative.   All other systems reviewed and are negative.    BP 110/62  Pulse 60  Ht 5\' 9"  (1.753 m)  Wt 171 lb (77.565 kg)  BMI 25.25 kg/m2  Physical Exam  Nursing note and vitals reviewed. Constitutional: He is oriented to person, place, and time. He appears well-developed and well-nourished.       On oxygen  HENT:  Head: Normocephalic.  Nose: Nose normal.  Mouth/Throat: Oropharynx is clear and  moist.  Eyes: Conjunctivae are normal. Pupils are equal, round, and reactive to light.  Neck: Normal range of motion. Neck supple. No JVD present.  Cardiovascular: Normal rate, regular rhythm, S1 normal, S2 normal, normal heart sounds and intact distal pulses.  Exam reveals no gallop and no friction rub.   No murmur heard. Pulmonary/Chest: Effort normal. No respiratory distress. He has decreased breath sounds. He has no wheezes. He has no rales. He exhibits no tenderness.  Abdominal: Soft. Bowel sounds are normal. He exhibits no distension. There is no tenderness.  Musculoskeletal: Normal range of motion. He exhibits no edema and no tenderness.  Lymphadenopathy:    He has no cervical adenopathy.  Neurological: He is alert and oriented to person, place, and time. Coordination normal.  Skin: Skin is warm and dry. No rash noted. No erythema.  Psychiatric: He has a normal mood and affect. His behavior is normal. Judgment and thought content normal.           Assessment and Plan

## 2011-08-27 ENCOUNTER — Telehealth: Payer: Self-pay | Admitting: Internal Medicine

## 2011-08-27 DIAGNOSIS — J9610 Chronic respiratory failure, unspecified whether with hypoxia or hypercapnia: Secondary | ICD-10-CM

## 2011-08-27 DIAGNOSIS — E119 Type 2 diabetes mellitus without complications: Secondary | ICD-10-CM

## 2011-08-27 DIAGNOSIS — J438 Other emphysema: Secondary | ICD-10-CM

## 2011-08-27 DIAGNOSIS — F068 Other specified mental disorders due to known physiological condition: Secondary | ICD-10-CM

## 2011-08-27 DIAGNOSIS — I2510 Atherosclerotic heart disease of native coronary artery without angina pectoris: Secondary | ICD-10-CM

## 2011-08-27 NOTE — Telephone Encounter (Signed)
[name] called requesting home care for Mr. Toni and Mrs. Brix.  Mr. Maggie can't do much of anything for himself due to the dementia.  [name] thinks they need someone to come in for 4-5 hours each day to get things going.  They need help with meals and medicine.

## 2011-08-28 ENCOUNTER — Telehealth: Payer: Self-pay | Admitting: Pulmonary Disease

## 2011-08-28 MED ORDER — PREDNISONE 10 MG PO TABS: ORAL_TABLET | ORAL | Status: DC

## 2011-08-28 MED ORDER — DOXYCYCLINE HYCLATE 100 MG PO TABS: 100.0000 mg | ORAL_TABLET | Freq: Two times a day (BID) | ORAL | Status: AC

## 2011-08-28 NOTE — Telephone Encounter (Signed)
[name] states that patient is having trouble when up walking from one room to another; sats dropped to 84% 3l/m continuous; [name] is concerned with his O2 levels dropping but understands to keep OV with TP tomorrow. Also, [name] states patient is SOB and sounds like a "seal" when this is happening. [name] would like to know if she should increase patients O2 for now until OV with TP. RB as doc of the afternoon please advise. Thanks.

## 2011-08-28 NOTE — Telephone Encounter (Signed)
Reviewed the symptoms with [name] by phone. No wheezing but significant cough and dyspnea.  - will temporarily increase O2 to 4L/min with exertion - start pred taper + doxy - keep ROV with TP tomorrow.

## 2011-08-28 NOTE — Telephone Encounter (Signed)
[name] called back & can be reached at [phone] starting about 4:55 PM tonight.    [name]

## 2011-08-29 ENCOUNTER — Ambulatory Visit (INDEPENDENT_AMBULATORY_CARE_PROVIDER_SITE_OTHER): Payer: Medicare Other | Admitting: Adult Health

## 2011-08-29 ENCOUNTER — Other Ambulatory Visit: Payer: Self-pay | Admitting: Adult Health

## 2011-08-29 ENCOUNTER — Encounter: Payer: Self-pay | Admitting: Adult Health

## 2011-08-29 VITALS — BP 112/74 | HR 72 | Temp 97.4°F | Ht 68.0 in | Wt 175.2 lb

## 2011-08-29 DIAGNOSIS — J438 Other emphysema: Secondary | ICD-10-CM

## 2011-08-29 MED ORDER — HYDROCODONE-HOMATROPINE 5-1.5 MG/5ML PO SYRP: 5.0000 mL | ORAL_SOLUTION | Freq: Four times a day (QID) | ORAL | Status: DC | PRN

## 2011-08-29 NOTE — Assessment & Plan Note (Signed)
COPD exacerbation   Plan:  Finish Doxycycline and Prednisone taper as directed.  Mucinex DM Twice daily  As needed  Cough/congestion  Fluids and rest  follow up Dr. Shelle Iron as planned and As needed   Please contact office for sooner follow up if symptoms do not improve or worsen or seek emergency care

## 2011-08-29 NOTE — Patient Instructions (Signed)
Finish Doxycycline and Prednisone taper as directed.  Mucinex DM Twice daily  As needed  Cough/congestion  Fluids and rest  follow up Dr. Shelle Iron as planned and As needed   Please contact office for sooner follow up if symptoms do not improve or worsen or seek emergency care

## 2011-08-29 NOTE — Progress Notes (Signed)
  Subjective:    Patient ID: [name], male    DOB: November 23, 1924, 78 y.o.   MRN: [id number]  HPI 78 yo male with known hx of COPD and resp failure on chronic o2   08/29/2011 Acute OV  Complains of increased DOE, cough, chest congestion x3days.  Patient was called in doxycycline and a steroid taper yesterday.  Patient is accompanied by his daughter-in-law.  She says that his cough is slightly better today.  He denies any hemoptysis, chest pain, orthopnea, PND, or leg swelling.    Review of Systems Constitutional:   No  weight loss, night sweats,  Fevers, chills,  +fatigue, or  lassitude.  HEENT:   No headaches,  Difficulty swallowing,  Tooth/dental problems, or  Sore throat,                No sneezing, itching, ear ache, nasal congestion, post nasal drip,   CV:  No chest pain,  Orthopnea, PND, swelling in lower extremities, anasarca, dizziness, palpitations, syncope.   GI  No heartburn, indigestion, abdominal pain, nausea, vomiting, diarrhea, change in bowel habits, loss of appetite, bloody stools.   Resp:  ,  No coughing up of blood.  Marland Kitchen  No chest wall deformity  Skin: no rash or lesions.  GU: no dysuria, change in color of urine, no urgency or frequency.  No flank pain, no hematuria   MS:  No joint pain or swelling.  No decreased range of motion.  No back pain.  Psych:  No change in mood or affect. No depression or anxiety.  No memory loss.         Objective:   Physical Exam GEN: A/Ox3; pleasant , NAD   HEENT:  [place]/AT,  EACs-clear, TMs-wnl, NOSE-clear, THROAT-clear, no lesions, no postnasal drip or exudate noted.   NECK:  Supple w/ fair ROM; no JVD; normal carotid impulses w/o bruits; no thyromegaly or nodules palpated; no lymphadenopathy.  RESP  Coarse BS w/ few exp wheezes no accessory muscle use, no dullness to percussion  CARD:  RRR, no m/r/g  , no peripheral edema, pulses intact, no cyanosis or clubbing.  GI:   Soft & nt; nml bowel sounds; no organomegaly or  masses detected.  Musco: Warm bil, no deformities or joint swelling noted.   Neuro: alert, no focal deficits noted.    Skin: Warm, no lesions or rashes         Assessment & Plan:

## 2011-09-04 ENCOUNTER — Telehealth: Payer: Self-pay | Admitting: *Deleted

## 2011-09-04 NOTE — Telephone Encounter (Signed)
Prior authorization sent in for medication Tradjenta. Waiting on approval notice. Reference # [id number]

## 2011-09-12 ENCOUNTER — Other Ambulatory Visit: Payer: Self-pay | Admitting: *Deleted

## 2011-09-12 NOTE — Telephone Encounter (Signed)
Explained to patient daughter in law that prior authorization for tradjenta was denied. Dr. Debby Bud aware of this and will review chart  And call her back on monday

## 2011-09-16 ENCOUNTER — Other Ambulatory Visit: Payer: Self-pay | Admitting: *Deleted

## 2011-09-16 MED ORDER — IPRATROPIUM-ALBUTEROL 0.5-2.5 (3) MG/3ML IN SOLN: 3.0000 mL | Freq: Four times a day (QID) | RESPIRATORY_TRACT | Status: DC

## 2011-09-17 ENCOUNTER — Telehealth: Payer: Self-pay | Admitting: *Deleted

## 2011-09-17 NOTE — Telephone Encounter (Signed)
We will file another PA or appeal the current denial. I am happy to speak with a medical director about this.

## 2011-09-17 NOTE — Telephone Encounter (Signed)
Patient prior authorization denied for medication Tradjenta 5 mg tablet. Daughter states they also denied  Onglyza 5 mg tablet also. Please advise

## 2011-09-18 NOTE — Telephone Encounter (Signed)
Resubmitted prior authorization for Tradjenta. Will be 24 to 72 hours fax of response.

## 2011-09-21 ENCOUNTER — Other Ambulatory Visit: Payer: Self-pay | Admitting: Cardiovascular Disease

## 2011-09-21 ENCOUNTER — Telehealth: Payer: Self-pay | Admitting: Internal Medicine

## 2011-09-21 NOTE — Telephone Encounter (Signed)
On call, 09/20/11- Didn't receive enough budesonide with last shipment.  Plan- ok to do without it through weekend. Then call office Monday.

## 2011-09-22 ENCOUNTER — Telehealth: Payer: Self-pay | Admitting: Pulmonary Disease

## 2011-09-22 MED ORDER — BUDESONIDE 0.5 MG/2ML IN SUSP: 0.5000 mg | Freq: Two times a day (BID) | RESPIRATORY_TRACT | Status: DC

## 2011-09-22 NOTE — Telephone Encounter (Signed)
Patient has not received his shipment for Budesonide from [facility]. Pt's spouse states that we need to call [facility] and give authorization for the Budesonide and this would expedite the matter. Sample of Pulmicort 1mg  left for pt to pick up with instructions to use only 1/2 vial twice daily. Refill called into [facility].

## 2011-09-22 NOTE — Telephone Encounter (Signed)
Spoke with pharmacist at [facility] and gave ok for # 60 vials with 6 rf.  Nothing further needed.

## 2011-09-23 NOTE — Telephone Encounter (Signed)
[name], please see what the deal is here.  Thanks.

## 2011-09-24 ENCOUNTER — Ambulatory Visit (INDEPENDENT_AMBULATORY_CARE_PROVIDER_SITE_OTHER): Payer: Medicare Other | Admitting: Pulmonary Disease

## 2011-09-24 ENCOUNTER — Encounter: Payer: Self-pay | Admitting: Pulmonary Disease

## 2011-09-24 VITALS — BP 110/64 | HR 65 | Temp 98.2°F | Ht 68.0 in | Wt 180.8 lb

## 2011-09-24 DIAGNOSIS — J438 Other emphysema: Secondary | ICD-10-CM

## 2011-09-24 DIAGNOSIS — J9610 Chronic respiratory failure, unspecified whether with hypoxia or hypercapnia: Secondary | ICD-10-CM

## 2011-09-24 NOTE — Progress Notes (Signed)
  Subjective:    Patient ID: [name], male    DOB: 1925/03/03, 78 y.o.   MRN: [id number]  HPI The patient comes in today for followup of his known emphysema with chronic respiratory failure.  He is having good and bad days, but has not had any recent acute exacerbation or pulmonary infection.  He is staying on his nebulized bronchodilators, and is wearing the oxygen compliantly as well.  He has no significant worsening congestion or purulence.   Review of Systems  Constitutional: Negative.  Negative for fever and unexpected weight change.  HENT: Positive for rhinorrhea. Negative for ear pain, nosebleeds, congestion, sore throat, sneezing, trouble swallowing, dental problem, postnasal drip and sinus pressure.   Eyes: Negative.  Negative for redness and itching.  Respiratory: Positive for cough, shortness of breath and wheezing. Negative for chest tightness.   Cardiovascular: Negative.  Negative for palpitations and leg swelling.  Gastrointestinal: Negative.  Negative for nausea and vomiting.  Genitourinary: Negative.  Negative for dysuria.  Musculoskeletal: Negative.  Negative for joint swelling.  Skin: Negative.  Negative for rash.  Neurological: Negative.  Negative for headaches.  Hematological: Bruises/bleeds easily.  Psychiatric/Behavioral: Negative.  Negative for dysphoric mood. The patient is not nervous/anxious.        Objective:   Physical Exam Well-developed male in no acute distress Nose without purulence or discharge noted Chest with mildly decreased breath sounds, but no wheezes or rhonchi Cardiac exam with regular rate and rhythm Lower extremities with no edema, no cyanosis Alert, and moves all 4 extremities.       Assessment & Plan:

## 2011-09-24 NOTE — Patient Instructions (Addendum)
No change in medications. Try to stay as active as possible, but do not go outside if temperature is more than 85 degrees. followup with me in 4 mos.

## 2011-09-24 NOTE — Telephone Encounter (Signed)
Pt is scheduled for follow-up today with KC @ 2pm.

## 2011-09-24 NOTE — Assessment & Plan Note (Signed)
Overall, the patient is doing fairly well from a pulmonary standpoint.  He is having the typical good and bad days, but there is nothing to suggest a pulmonary infection or acute exacerbation.  I've asked him to continue on his current medications, and to try and stay as active as possible.  He is to call if there are any worsening symptoms.

## 2011-10-05 ENCOUNTER — Emergency Department (HOSPITAL_COMMUNITY)
Admission: EM | Admit: 2011-10-05 | Discharge: 2011-10-05 | Disposition: A | Discharge status: Home / Self Care | Payer: Medicare Other | Attending: Emergency Medicine | Admitting: Emergency Medicine

## 2011-10-05 ENCOUNTER — Encounter (HOSPITAL_COMMUNITY): Payer: Self-pay | Admitting: *Deleted

## 2011-10-05 ENCOUNTER — Emergency Department (HOSPITAL_COMMUNITY): Payer: Medicare Other

## 2011-10-05 ENCOUNTER — Telehealth: Payer: Self-pay | Admitting: Internal Medicine

## 2011-10-05 DIAGNOSIS — E119 Type 2 diabetes mellitus without complications: Secondary | ICD-10-CM | POA: Insufficient documentation

## 2011-10-05 DIAGNOSIS — Z79899 Other long term (current) drug therapy: Secondary | ICD-10-CM | POA: Insufficient documentation

## 2011-10-05 DIAGNOSIS — I10 Essential (primary) hypertension: Secondary | ICD-10-CM | POA: Insufficient documentation

## 2011-10-05 DIAGNOSIS — F068 Other specified mental disorders due to known physiological condition: Secondary | ICD-10-CM | POA: Insufficient documentation

## 2011-10-05 DIAGNOSIS — I2510 Atherosclerotic heart disease of native coronary artery without angina pectoris: Secondary | ICD-10-CM | POA: Insufficient documentation

## 2011-10-05 DIAGNOSIS — E7800 Pure hypercholesterolemia, unspecified: Secondary | ICD-10-CM | POA: Insufficient documentation

## 2011-10-05 DIAGNOSIS — J9610 Chronic respiratory failure, unspecified whether with hypoxia or hypercapnia: Secondary | ICD-10-CM | POA: Insufficient documentation

## 2011-10-05 DIAGNOSIS — R0602 Shortness of breath: Secondary | ICD-10-CM | POA: Insufficient documentation

## 2011-10-05 DIAGNOSIS — G4733 Obstructive sleep apnea (adult) (pediatric): Secondary | ICD-10-CM | POA: Insufficient documentation

## 2011-10-05 DIAGNOSIS — J449 Chronic obstructive pulmonary disease, unspecified: Secondary | ICD-10-CM | POA: Insufficient documentation

## 2011-10-05 DIAGNOSIS — J4489 Other specified chronic obstructive pulmonary disease: Secondary | ICD-10-CM | POA: Insufficient documentation

## 2011-10-05 LAB — DIFFERENTIAL
Basophils Absolute: 0 10*3/uL (ref 0.0–0.1)
Basophils Relative: 0 % (ref 0–1)
Eosinophils Absolute: 0 10*3/uL (ref 0.0–0.7)
Monocytes Relative: 15 % — ABNORMAL HIGH (ref 3–12)
Neutro Abs: 8.9 10*3/uL — ABNORMAL HIGH (ref 1.7–7.7)
Neutrophils Relative %: 77 % (ref 43–77)

## 2011-10-05 LAB — CBC
Hemoglobin: 13.8 g/dL (ref 13.0–17.0)
MCH: 31.1 pg (ref 26.0–34.0)
MCHC: 32.5 g/dL (ref 30.0–36.0)
RDW: 13.6 % (ref 11.5–15.5)

## 2011-10-05 LAB — POCT I-STAT 3, ART BLOOD GAS (G3+)
O2 Saturation: 90 %
Patient temperature: 98.7
pCO2 arterial: 41.7 mmHg (ref 35.0–45.0)

## 2011-10-05 LAB — PRO B NATRIURETIC PEPTIDE: Pro B Natriuretic peptide (BNP): 782 pg/mL — ABNORMAL HIGH (ref 0–450)

## 2011-10-05 LAB — BASIC METABOLIC PANEL
BUN: 23 mg/dL (ref 6–23)
Chloride: 98 mEq/L (ref 96–112)
Creatinine, Ser: 1.13 mg/dL (ref 0.50–1.35)
GFR calc Af Amer: 67 mL/min — ABNORMAL LOW (ref >90)
GFR calc non Af Amer: 58 mL/min — ABNORMAL LOW (ref >90)
Potassium: 4.1 mEq/L (ref 3.5–5.1)

## 2011-10-05 LAB — POCT I-STAT TROPONIN I: Troponin i, poc: 0 ng/mL (ref 0.00–0.08)

## 2011-10-05 MED ORDER — ALBUTEROL SULFATE (5 MG/ML) 0.5% IN NEBU
2.5000 mg | INHALATION_SOLUTION | Freq: Once | RESPIRATORY_TRACT | Status: AC
  Administered 2011-10-05: 2.5 mg via RESPIRATORY_TRACT
  Filled 2011-10-05: qty 0.5

## 2011-10-05 MED ORDER — PREDNISONE 10 MG PO TABS: 60.0000 mg | ORAL_TABLET | Freq: Every day | ORAL | Status: DC

## 2011-10-05 MED ORDER — IPRATROPIUM BROMIDE 0.02 % IN SOLN
0.5000 mg | RESPIRATORY_TRACT | Status: DC
  Administered 2011-10-05: 0.5 mg via RESPIRATORY_TRACT
  Filled 2011-10-05: qty 2.5

## 2011-10-05 MED ORDER — PREDNISONE 20 MG PO TABS
60.0000 mg | ORAL_TABLET | Freq: Once | ORAL | Status: AC
  Administered 2011-10-05: 60 mg via ORAL
  Filled 2011-10-05: qty 3

## 2011-10-05 NOTE — Telephone Encounter (Signed)
Got call from answsering servicd at 3:57pm or so saying they paged me at 3:05 pm aout this patient. I never got that page. I called at 16:15 in response to that page but no answer. I called again 16.30 and daughter in law told me that she had called answering service at 2.30pm   Anyways, patient is in AE-CHF v AE-COPD and EMS was at patient home ready to take her to hospital when I called

## 2011-10-05 NOTE — Discharge Instructions (Signed)
Check blood sugar frequently due to the prednisone. Return for any worsening of symptoms. Please follow with Primary care and pulmonology in the next 1-2 days.   Shortness of Breath Shortness of breath (dyspnea) is the feeling of uneasy breathing. Shortness of breath does not always mean that there is a life-threatening illness. However, shortness of breath requires immediate medical care. CAUSES  Causes for shortness of breath include:  Not enough oxygen in the air (as with high altitudes or with a smoke-filled room).   Short-term (acute) lung disease, including:   Infections such as pneumonia.   Fluid in the lungs, such as heart failure.   A blood clot in the lungs (pulmonary embolism).   Lasting (chronic) lung diseases.   Heart disease (heart attack, angina, heart failure, and others).   Low red blood cells (anemia).   Poor physical fitness. This can cause shortness of breath when you exercise.   Chest or back injuries or stiffness.   Being overweight (obese).   Anxiety. This can make you feel like you are not getting enough air.  DIAGNOSIS  Serious medical problems can usually be found during your physical exam. Many tests may also be done to determine why you are having shortness of breath. Tests include:  Chest X-rays.   Lung function tests.   Blood tests.   Electrocardiography.   Exercise testing.   A cardiac echo.   Imaging scans.  Your caregiver may not be able to find a cause for your shortness of breath after your exam. In this case, it is important to have a follow-up exam with your caregiver as directed.  HOME CARE INSTRUCTIONS   Do not smoke. Smoking is a common cause of shortness of breath. Ask for help to stop smoking.   Avoid being around chemicals that may bother your breathing (paint fumes, dust).   Rest as needed. Slowly resume your usual activities.   If medicines were prescribed, take them as directed for the full length of time directed.  This includes oxygen and any inhaled medicines.   Follow up with your caregiver as directed. Waiting to do so or failure to follow up could result in worsening of your condition and possible disability or death.   Be sure you understand what to do or who to call if your shortness of breath worsens.  SEEK MEDICAL CARE IF:   Your condition does not improve in the time expected.   You have a hard time doing your normal activities even with rest.   You have any side effects or problems with the medicines prescribed.   You develop any new symptoms.  SEEK IMMEDIATE MEDICAL CARE IF:   Your shortness of breath is getting worse.   You feel lightheaded, faint, or develop a cough not controlled with medicines.   You start coughing up blood.   You have pain with breathing.   You have chest pain or pain in your arms, shoulders, or abdomen.   You have a fever.   You are unable to walk up stairs or exercise the way you normally do.   Your symptoms are getting worse.  Document Released: 12/24/2000 Document Revised: 03/20/2011 Document Reviewed: 08/11/2007 [facility] Patient Information 2012 [place], [place].

## 2011-10-05 NOTE — ED Provider Notes (Signed)
History     CSN: [id number]  Arrival date & time 10/05/11  1717   First MD Initiated Contact with Patient 10/05/11 1732      Chief Complaint  Patient presents with  . Shortness of Breath    (Consider location/radiation/quality/duration/timing/severity/associated sxs/prior treatment) Patient is a 78 y.o. male presenting with shortness of breath. The history is provided by the patient.  Shortness of Breath  The current episode started more than 2 weeks ago. Associated symptoms include shortness of breath. Pertinent negatives include no chest pain, no chest pressure, no orthopnea, no fever and no cough.   78 y/o male with COPD and dementia c/o SOB x4 weeks. Denies fever, cough, chest pain or pressure. AS per family caretakers Pt has chronic DOE and orthopnea. But Pt has stopped responding to home nebs and has developed tachypnea and cough x24 hours. Denies fever.    Past Medical History  Diagnosis Date  . Bradycardia   . dementia   . mass, lung right lilum     patient had an abnormal CT chest 2+ years ago after infection: changes thougth to be a reaction and did clear.   . Shortness of breath   . weakness   . Chronic respiratory failure   . Obstructive sleep apnea (adult) (pediatric)   . allergi rhin, chronic   . Emphysema   . inguinal hernia, right   . Other specified disorder of skin   . COPD (chronic obstructive pulmonary disease)   . varicose vein     mild  . Hypertension   . coronary artery disease   . History of cardiac cath 06/03/04    acute info post MI, stent PICA  . History of cardiac cath 06/03/04    acute info post MI, stent PICA  . Asthma   . diabetes mell, type 2   . Pure hypercholesterolemia     Past Surgical History  Procedure Date  . Cholecystectomy 01/26/03-01/31/2003     [facility] ERCP, cholecystitis  . Stress/adenosine myoview 03/25/04    abnormal EF 50%  . Total hip arthroplasty 02/06    Left, anemia 2 units PRBC  . Adenosine myoview 08/13/05   EF 46%  . Total hip arthroplasty 05/31/05    right  . Ct angio chest 10/28/08    Neg PE, 3x3x2.2 cm right hilar mass  . Pet scan 11/02/08    Inflam/Infect Atelect RUL w/reactive lymphadenopathy  . Hernia repair 01/00    left recurrent    Family History  Problem Relation Age of Onset  . Cancer Sister     breast  . Diabetes Brother   . Hypertension Brother   . Hyperlipidemia Brother   . Parkinsonism Brother   . Cancer Sister     History  Substance Use Topics  . Smoking status: Former Smoker -- 1.0 packs/day for 25 years    Types: Cigarettes    Quit date: 08/12/1960  . Smokeless tobacco: Never Used  . Alcohol Use: No      Review of Systems  Constitutional: Negative for fever.  Respiratory: Positive for shortness of breath. Negative for cough.   Cardiovascular: Negative for chest pain and orthopnea.  All other systems reviewed and are negative.    Allergies  Amoxicillin; Clarithromycin; Latex; and Penicillins  Home Medications   Current Outpatient Rx  Name Route Sig Dispense Refill  . ACETAMINOPHEN 500 MG PO TABS Oral Take 1,000 mg by mouth at bedtime.      . ASPIRIN EC 81 MG  PO TBEC Oral Take 162 mg by mouth daily.     . BUDESONIDE 0.5 MG/2ML IN SUSP Nebulization Take 2 mLs (0.5 mg total) by nebulization 2 (two) times daily. 120 mL 11  . VITAMIN D 1000 UNITS PO TABS Oral Take 1,000 Units by mouth daily.      Marland Kitchen CLOPIDOGREL BISULFATE 75 MG PO TABS  TAKE 1 TABLET EVERY DAY 30 tablet 1  . DONEPEZIL HCL 10 MG PO TABS Oral Take 10 mg by mouth at bedtime.    Marland Kitchen FLUTICASONE PROPIONATE 50 MCG/ACT NA SUSP Nasal Place 2 sprays into the nose daily.    Marland Kitchen GLIMEPIRIDE 4 MG PO TABS Oral Take 1 tablet (4 mg total) by mouth daily before breakfast. 30 tablet 5  . HYDROCODONE-HOMATROPINE 5-1.5 MG/5ML PO SYRP Oral Take 5 mLs by mouth every 6 (six) hours as needed. For cough 240 mL 0  . IPRATROPIUM-ALBUTEROL 0.5-2.5 (3) MG/3ML IN SOLN Nebulization Take 3 mLs by nebulization 4 (four)  times daily. 360 mL 1  . LOSARTAN POTASSIUM 50 MG PO TABS Oral Take 50 mg by mouth daily.    Marland Kitchen MELATONIN 5 MG PO TABS Oral Take 0.5 tablets by mouth at bedtime.     Marland Kitchen MEMANTINE HCL 10 MG PO TABS Oral Take 10 mg by mouth 2 (two) times daily.    . MULTIVITAMINS PO CAPS Oral Take 1 capsule by mouth daily. Chewable.    Marland Kitchen PROVENTIL HFA 108 (90 BASE) MCG/ACT IN AERS  USE 2 PUFFS EVERY 4 HOURS AS NEEDED 1 Inhaler 4  . SERTRALINE HCL 50 MG PO TABS Oral Take 50 mg by mouth at bedtime.    Marland Kitchen SIMVASTATIN 80 MG PO TABS  TAKE 1/2 TABLET BY MOUTH ONCE A DAY 30 tablet 3    BP 124/34  Temp 99.8 F (37.7 C) (Oral)  Resp 28  SpO2 73%  Physical Exam  Vitals reviewed. Constitutional: He is oriented to person, place, and time. He appears well-developed and well-nourished. No distress.  HENT:  Head: Normocephalic.  Eyes: Conjunctivae and EOM are normal.  Neck: No JVD present.  Cardiovascular: Normal rate.   Pulmonary/Chest: Effort normal and breath sounds normal. No stridor. No respiratory distress. He has no wheezes. He has no rales.       Prolonged expiratory phase   Abdominal: Soft. Bowel sounds are normal.  Musculoskeletal: Normal range of motion. He exhibits no edema.  Neurological: He is alert and oriented to person, place, and time.  Skin: Skin is warm and dry. He is not diaphoretic.    ED Course  Procedures (including critical care time)  Labs Reviewed  CBC - Abnormal; Notable for the following:    WBC 11.6 (*)     Platelets 137 (*)     All other components within normal limits  DIFFERENTIAL - Abnormal; Notable for the following:    Neutro Abs 8.9 (*)     Lymphocytes Relative 8 (*)     Monocytes Relative 15 (*)     Monocytes Absolute 1.7 (*)     All other components within normal limits  BASIC METABOLIC PANEL - Abnormal; Notable for the following:    Glucose, Bld 227 (*)     GFR calc non Af Amer 58 (*)     GFR calc Af Amer 67 (*)     All other components within normal limits  PRO  B NATRIURETIC PEPTIDE - Abnormal; Notable for the following:    Pro B Natriuretic peptide (BNP) 782.0 (*)  All other components within normal limits  POCT I-STAT 3, BLOOD GAS (G3+) - Abnormal; Notable for the following:    pO2, Arterial 58.0 (*)     Bicarbonate 28.0 (*)     Acid-Base Excess 3.0 (*)     All other components within normal limits  POCT I-STAT TROPONIN I   Dg Chest Port 1 View  10/05/2011  *RADIOLOGY REPORT*  Clinical Data: 78 year old male with shortness of breath. Respiratory failure.  PORTABLE CHEST - 1 VIEW  Comparison: 07/09/2011 and earlier.  Findings: Portable semi upright AP view at 1811 hours.  Lower lung volumes.  Patchy increased retrocardiac opacity.  Questionable increased right hilar soft tissue versus exaggerated mediastinal contours due to lower lung volume.  No pneumothorax or pulmonary edema.  No definite effusion.  IMPRESSION: 1.  Lower lung volumes.  Patchy bibasilar opacity most resembles atelectasis, but acute infection not excluded. 2.  Increased right mediastinal soft tissue versus artifact due to lower lung volumes. Recommend follow-up PA and lateral chest radiographs when possible compared to 07/09/2011 and earlier.  Original Report Authenticated By: Harley Hallmark, M.D.     No diagnosis found.   Date: 10/05/2011  Rate: 87   Rhythm: normal sinus rhythm with PVCs  QRS Axis: left  Intervals: normal  ST/T Wave abnormalities: nonspecific ST changes  Conduction Disutrbances:none  Narrative Interpretation:   Old EKG Reviewed: unchanged    MDM  78 y/o male with dementia prrsenting with SOBx4 weeks.  Pt seen by pulmonology and sent to ED for  for CHF vs COPD exacerbation. Pt Hypoxic and tachypnea at presentation, improved O2  via nasal canula to 90's. BNP 782 was 124 in 06/2011. CXR inconclusive for bilateral lower lobe atelectasis vs pneumonia, . ABG shows no CO2 retention.  I discussed the results with the pt's children who are also his caregivers.  They state that they are comfortable in giving him care at home and are not overwhelmed by the level of care that he requires. Gave specific d/c instructions and with low threshold for return if needed. Will d/c with 60mg  prednisone daily x5day with frequent blood glucose checks and follow with PCP, pulmonology and cardiology       Wynetta Emery, PA-C 10/05/11 2059

## 2011-10-05 NOTE — ED Provider Notes (Signed)
Patient seen with PA Here for increased SOB His hypoxia has improved He has prolonged exp phase Will need admission Will follow closely  Joya Gaskins, MD 10/05/11 1747

## 2011-10-05 NOTE — ED Notes (Signed)
RT PAGED BY SECRETARY TO COLLECT BLOOD GAS BLOOD SPECIMEN.

## 2011-10-05 NOTE — ED Notes (Signed)
N. PISCIOTTA  PA AT BEDSIDE EVALUATING PT .

## 2011-10-05 NOTE — ED Notes (Signed)
Pt arrived by [facility] for sob, diminished lower lung sounds pta, bilateral rhonchi, given 5mg  alb neb tx pta, spo2 93% on 4L [place]. Iv started pta.

## 2011-10-06 NOTE — ED Provider Notes (Signed)
Medical screening examination/treatment/procedure(s) were conducted as a shared visit with non-physician practitioner(s) and myself.  I personally evaluated the patient during the encounter  Initially concern that may need to be admitted, but pt stabilized in ED, no distress, was at baseline and family felt comfortable taking him home.  PA was able to have long discussion with family and will start on short course of prednisone.    Joya Gaskins, MD 10/06/11 [phone]

## 2011-10-07 ENCOUNTER — Ambulatory Visit (INDEPENDENT_AMBULATORY_CARE_PROVIDER_SITE_OTHER): Payer: Medicare Other | Admitting: Internal Medicine

## 2011-10-07 ENCOUNTER — Encounter: Payer: Self-pay | Admitting: Internal Medicine

## 2011-10-07 VITALS — BP 122/60 | HR 115 | Temp 97.9°F | Resp 20 | Wt 179.0 lb

## 2011-10-07 DIAGNOSIS — IMO0002 Reserved for concepts with insufficient information to code with codable children: Secondary | ICD-10-CM

## 2011-10-07 DIAGNOSIS — J438 Other emphysema: Secondary | ICD-10-CM

## 2011-10-07 DIAGNOSIS — R451 Restlessness and agitation: Secondary | ICD-10-CM

## 2011-10-07 DIAGNOSIS — I509 Heart failure, unspecified: Secondary | ICD-10-CM

## 2011-10-07 MED ORDER — LORAZEPAM 0.5 MG PO TABS: 0.5000 mg | ORAL_TABLET | Freq: Two times a day (BID) | ORAL | Status: AC | PRN

## 2011-10-07 MED ORDER — PREDNISONE 10 MG PO TABS: 10.0000 mg | ORAL_TABLET | Freq: Every day | ORAL | Status: DC

## 2011-10-07 MED ORDER — FUROSEMIDE 20 MG PO TABS: 20.0000 mg | ORAL_TABLET | Freq: Every day | ORAL | Status: DC

## 2011-10-07 NOTE — Patient Instructions (Addendum)
Breathing - is better but you are still short of breath and wheezing. Plan - extend steroid taper as instructed.  Heart failure - sound a little fluid overloaded.  Plan - lasix 20 mg once a day. Will schedule 2 D echo reasses heart function, was 45% with normal being 60-65%.  Please do not drive! Your breathing is too labored for you to manage safely  For you nerves you can take lorazepam 0.5 mg every 6 hours as needed.

## 2011-10-07 NOTE — Progress Notes (Signed)
Subjective:    Patient ID: [name], male    DOB: Dec 15, 1924, 78 y.o.   MRN: [id number]  HPI [name] is for ED follow-up. He was seen 10/05/11 - notes and labs and xrays reviewed. He had had increased SOB and was seen in ED. He was noted to have hypoxemia, increased WOB. CBC with mild elevation in WBC, pBNP was 762 ( up from baseline of 164), CXR with atelectasis with pul edema or PNA. In the ED he had breathing treatments and was discharged  Per ED note for prednisone 60 mg qd x 5 but Rx was given for 12 10 mg tabs. No change in other medications. He is better than the 23rd but still with some SOB and wet cough.   Past Medical History  Diagnosis Date  . Bradycardia   . dementia   . mass, lung right lilum     patient had an abnormal CT chest 2+ years ago after infection: changes thougth to be a reaction and did clear.   . Shortness of breath   . weakness   . Chronic respiratory failure   . Obstructive sleep apnea (adult) (pediatric)   . allergi rhin, chronic   . Emphysema   . inguinal hernia, right   . Other specified disorder of skin   . COPD (chronic obstructive pulmonary disease)   . varicose vein     mild  . Hypertension   . coronary artery disease   . History of cardiac cath 06/03/04    acute info post MI, stent PICA  . History of cardiac cath 06/03/04    acute info post MI, stent PICA  . Asthma   . diabetes mell, type 2   . Pure hypercholesterolemia    Past Surgical History  Procedure Date  . Cholecystectomy 01/26/03-01/31/2003     [facility] ERCP, cholecystitis  . Stress/adenosine myoview 03/25/04    abnormal EF 50%  . Total hip arthroplasty 02/06    Left, anemia 2 units PRBC  . Adenosine myoview 08/13/05    EF 46%  . Total hip arthroplasty 05/31/05    right  . Ct angio chest 10/28/08    Neg PE, 3x3x2.2 cm right hilar mass  . Pet scan 11/02/08    Inflam/Infect Atelect RUL w/reactive lymphadenopathy  . Hernia repair 01/00    left recurrent   Family History    Problem Relation Age of Onset  . Cancer Sister     breast  . Diabetes Brother   . Hypertension Brother   . Hyperlipidemia Brother   . Parkinsonism Brother   . Cancer Sister    History   Social History  . Marital Status: Married    Spouse Name: N/A    Number of Children: 2  . Years of Education: 8   Occupational History  . 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     Social History Main Topics  . Smoking status: Former Smoker -- 1.0 packs/day for 25 years    Types: Cigarettes    Quit date: 08/12/1960  . Smokeless tobacco: Never Used  . Alcohol Use: No  . Drug Use: No  . Sexually Active: Not Currently   Other Topics Concern  . Not on file   Social History Narrative   Finished 8th grade. Married - 1946 -  . 1 son - '29; 1 dtr - '55;  3 grandchildren. 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  Lives alone with wife. End-of-Life Measures: no Cardiac resuscitation, no mechanical ventilation, no heroic or futile measures.     Current Outpatient Prescriptions on File Prior to Visit  Medication Sig Dispense Refill  . albuterol (PROVENTIL HFA;VENTOLIN HFA) 108 (90 BASE) MCG/ACT inhaler Inhale 2 puffs into the lungs every 4 (four) hours as needed. For wheezing/shortness of breath      . aspirin EC 81 MG EC tablet Take 162 mg by mouth daily.       . budesonide (PULMICORT) 0.5 MG/2ML nebulizer solution Take 0.5 mg by nebulization 2 (two) times daily.      . cholecalciferol (VITAMIN D) 1000 UNITS tablet Take 1,000 Units by mouth daily.        . clopidogrel (PLAVIX) 75 MG tablet Take 75 mg by mouth daily.      Marland Kitchen donepezil (ARICEPT) 10 MG tablet Take 10 mg by mouth at bedtime.      Marland Kitchen glimepiride (AMARYL) 4 MG tablet Take 4 mg by mouth daily before breakfast.      . HYDROcodone-homatropine (HYCODAN) 5-1.5 MG/5ML syrup Take 5 mLs by mouth every 6 (six) hours as needed. For cough      .  ipratropium-albuterol (DUONEB) 0.5-2.5 (3) MG/3ML SOLN Take 3 mLs by nebulization 4 (four) times daily.      Marland Kitchen losartan (COZAAR) 50 MG tablet Take 50 mg by mouth daily.      . memantine (NAMENDA) 10 MG tablet Take 10 mg by mouth 2 (two) times daily.      . Multiple Vitamin (MULTIVITAMIN WITH MINERALS) TABS Take 1 tablet by mouth daily.      . predniSONE (DELTASONE) 10 MG tablet Take 6 tablets (60 mg total) by mouth daily.  12 tablet  0  . sertraline (ZOLOFT) 50 MG tablet Take 50 mg by mouth at bedtime.      . simvastatin (ZOCOR) 80 MG tablet Take 40 mg by mouth at bedtime.          Review of Systems System review is negative for any constitutional, cardiac, pulmonary, GI or neuro symptoms or complaints other than as described in the HPI.    Objective:   Physical Exam Filed Vitals:   10/07/11 1312  BP: 122/60  Pulse: 115  Temp: 97.9 F (36.6 C)  Resp: 20   O2 sat  96% on 4 liters Gen'l- older white man in no acute distress Cor- RRR Pulm - prolonged expiratory phase, end-expiratory wheeze, feint bibasilar rales.        Assessment & Plan:

## 2011-10-07 NOTE — Assessment & Plan Note (Signed)
AE-COPD without need for antibiotics.  Plan - prednisone taper - 40 mg qd x 3, 30 mg qd x 3, 20 mg qd x 3, 10 mg qd x 6 = 33

## 2011-10-07 NOTE — Assessment & Plan Note (Signed)
Patient with mild increase in BNP and sounds a bit wet.  Plan Furosemide 20 mg once a day  2 D echo to assess LV (was 45%) to help with mgt.

## 2011-10-08 NOTE — Assessment & Plan Note (Signed)
Intermittent agitation and unsettled behavior.  Plan  Lorazepam 0.5 mg q 6 prn

## 2011-10-13 ENCOUNTER — Other Ambulatory Visit (HOSPITAL_COMMUNITY): Payer: Self-pay | Admitting: Radiology

## 2011-10-13 DIAGNOSIS — I509 Heart failure, unspecified: Secondary | ICD-10-CM

## 2011-10-14 ENCOUNTER — Ambulatory Visit (HOSPITAL_COMMUNITY): Payer: Medicare Other | Attending: Internal Medicine | Admitting: Radiology

## 2011-10-14 ENCOUNTER — Other Ambulatory Visit (HOSPITAL_COMMUNITY): Payer: Medicare Other

## 2011-10-14 DIAGNOSIS — I509 Heart failure, unspecified: Secondary | ICD-10-CM

## 2011-10-14 DIAGNOSIS — Z8673 Personal history of transient ischemic attack (TIA), and cerebral infarction without residual deficits: Secondary | ICD-10-CM | POA: Insufficient documentation

## 2011-10-14 DIAGNOSIS — R0602 Shortness of breath: Secondary | ICD-10-CM | POA: Insufficient documentation

## 2011-10-14 DIAGNOSIS — I517 Cardiomegaly: Secondary | ICD-10-CM | POA: Insufficient documentation

## 2011-10-14 DIAGNOSIS — R0609 Other forms of dyspnea: Secondary | ICD-10-CM | POA: Insufficient documentation

## 2011-10-14 DIAGNOSIS — R5381 Other malaise: Secondary | ICD-10-CM | POA: Insufficient documentation

## 2011-10-14 DIAGNOSIS — J4489 Other specified chronic obstructive pulmonary disease: Secondary | ICD-10-CM | POA: Insufficient documentation

## 2011-10-14 DIAGNOSIS — R0989 Other specified symptoms and signs involving the circulatory and respiratory systems: Secondary | ICD-10-CM | POA: Insufficient documentation

## 2011-10-14 DIAGNOSIS — J449 Chronic obstructive pulmonary disease, unspecified: Secondary | ICD-10-CM | POA: Insufficient documentation

## 2011-10-14 DIAGNOSIS — E119 Type 2 diabetes mellitus without complications: Secondary | ICD-10-CM | POA: Insufficient documentation

## 2011-10-14 DIAGNOSIS — I10 Essential (primary) hypertension: Secondary | ICD-10-CM | POA: Insufficient documentation

## 2011-10-14 DIAGNOSIS — I2510 Atherosclerotic heart disease of native coronary artery without angina pectoris: Secondary | ICD-10-CM | POA: Insufficient documentation

## 2011-10-14 DIAGNOSIS — R5383 Other fatigue: Secondary | ICD-10-CM | POA: Insufficient documentation

## 2011-10-14 NOTE — Progress Notes (Signed)
Echocardiogram performed.  

## 2011-10-20 ENCOUNTER — Encounter: Payer: Self-pay | Admitting: Internal Medicine

## 2011-10-23 ENCOUNTER — Other Ambulatory Visit: Payer: Self-pay | Admitting: Internal Medicine

## 2011-10-24 ENCOUNTER — Telehealth: Payer: Self-pay

## 2011-10-24 NOTE — Telephone Encounter (Signed)
[name]-in-law called requesting the results of recent ECHO, please advise.

## 2011-10-24 NOTE — Telephone Encounter (Signed)
Results mailed July 9th to [name]'s address: study should EF 45-50% with grade I diastolic dysfunction: this means he has some combined heart failure and can be medically managed between me and cardiology

## 2011-10-27 NOTE — Telephone Encounter (Signed)
Spoke with [name] and advised per MD. She would like to know if pt should follow up with PCP or cardiology first and how soon. Per Dr. Filbert Schilder, need to follow up with him relatively soon. Message transferred to scheduler to set up

## 2011-10-27 NOTE — Telephone Encounter (Signed)
Spoke with [name], pt scheduled.   Thanks!

## 2011-10-29 ENCOUNTER — Ambulatory Visit (INDEPENDENT_AMBULATORY_CARE_PROVIDER_SITE_OTHER): Payer: Medicare Other | Admitting: Internal Medicine

## 2011-10-29 ENCOUNTER — Encounter: Payer: Self-pay | Admitting: Internal Medicine

## 2011-10-29 VITALS — BP 104/60 | HR 85 | Temp 98.0°F | Resp 14 | Wt 176.0 lb

## 2011-10-29 DIAGNOSIS — J438 Other emphysema: Secondary | ICD-10-CM

## 2011-10-29 DIAGNOSIS — I10 Essential (primary) hypertension: Secondary | ICD-10-CM

## 2011-10-29 DIAGNOSIS — I509 Heart failure, unspecified: Secondary | ICD-10-CM

## 2011-10-29 MED ORDER — MUPIROCIN CALCIUM 2 % NA OINT: TOPICAL_OINTMENT | Freq: Three times a day (TID) | NASAL | Status: DC

## 2011-10-29 NOTE — Progress Notes (Signed)
  Subjective:    Patient ID: [name], male    DOB: 1925-02-09, 78 y.o.   MRN: [id number]  HPI Presents for review of 2D echo: July '13: EF 45-50%, no change from '09 and grade I diastolic dysfxn.   He is still having a lot of drainage. He also has a sore in the left nostril that he picks at and it has been bleeding.    Review of Systems     Objective:   Physical Exam Filed Vitals:   10/29/11 1419  BP: 104/60  Pulse: 85  Temp: 98 F (36.7 C)  Resp: 14   O2 sat 96% on 3 l  HEENT- lesion on medial aspect of left nostril with stigmata of recent bleeding. Chest - no wheezing, no rales, very feint breath sounds Cor - RRR       Assessment & Plan:

## 2011-10-29 NOTE — Patient Instructions (Addendum)
Heart function - not much change from '09: ejection fraction (juice per squeeze) is 45% with normal beinfg 60%. Poor relaxation which decreases filling and thus pumping action. You will get short of breath  Lungs- stable but definitely abnormal function and you do need oxygen. This will get you short of breath.  It is ok to be active as long as you stop when you get very short of breath. OK to do dishes and light work, ok to walk. The more you do the better you will do.  Sore in the left nostril - use mupirocin ointment, with a q-tip, 3 times a day to this lesion. For the drippy nose either zyrtec or allegra or benadryl - particularly useful at bedtime.  Continue your medications.

## 2011-11-01 NOTE — Assessment & Plan Note (Signed)
Last 2 d echo with no change in EF @ 45% along diastolic dysfunction. He is well compensated at today's exam. His BP is well controlled. His performance status is stable but limited.  Plan Continue present medications and inhalers.

## 2011-11-01 NOTE — Assessment & Plan Note (Signed)
BP Readings from Last 3 Encounters:  10/29/11 104/60  10/07/11 122/60  10/05/11 116/48   Good control on present regimen

## 2011-11-01 NOTE — Assessment & Plan Note (Signed)
He does have DOE and is oxygen dependent. His respiratory symptoms are compounded by his mixed CHF.  Plan Continue present medical regimen of inhalation therapy.

## 2011-11-11 ENCOUNTER — Other Ambulatory Visit: Payer: Self-pay | Admitting: Internal Medicine

## 2011-11-20 ENCOUNTER — Other Ambulatory Visit: Payer: Self-pay | Admitting: Cardiovascular Disease

## 2011-12-03 ENCOUNTER — Ambulatory Visit (INDEPENDENT_AMBULATORY_CARE_PROVIDER_SITE_OTHER): Payer: Medicare Other | Admitting: Pulmonary Disease

## 2011-12-03 ENCOUNTER — Telehealth: Payer: Self-pay | Admitting: Pulmonary Disease

## 2011-12-03 ENCOUNTER — Encounter: Payer: Self-pay | Admitting: Pulmonary Disease

## 2011-12-03 VITALS — BP 124/72 | HR 64 | Temp 97.6°F | Ht 68.0 in | Wt 180.8 lb

## 2011-12-03 DIAGNOSIS — J9610 Chronic respiratory failure, unspecified whether with hypoxia or hypercapnia: Secondary | ICD-10-CM

## 2011-12-03 DIAGNOSIS — J438 Other emphysema: Secondary | ICD-10-CM

## 2011-12-03 MED ORDER — PREDNISONE 10 MG PO TABS: ORAL_TABLET | ORAL | Status: DC

## 2011-12-03 NOTE — Patient Instructions (Addendum)
Will treat with a course of prednisone to see if we can decrease current cough. Get chlorpheniramine 4mg  and take 2 at bedtime to reduce postnasal drip. No change in maintenance medications. Keep followup apptm with me in October, but call if cough continues to be an issue.

## 2011-12-03 NOTE — Progress Notes (Signed)
  Subjective:    Patient ID: [name], male    DOB: 1925/03/13, 78 y.o.   MRN: [id number]  HPI Patient in today for an acute sick visit.  He has known significant COPD with chronic respiratory failure.  His daughter has noticed increased cough, and the patient feels this is secondary to continual postnasal drip at night whenever he lies down.  The daughter is concerned because once he gets coughing, it becomes cyclical in nature, and then leads to increased shortness of breath.  She also feels that he has had a little more labored breathing recently, but nothing to suggest a chest infection.   Review of Systems  Constitutional: Negative for fever and unexpected weight change.  HENT: Negative for ear pain, nosebleeds, congestion, sore throat, rhinorrhea, sneezing, trouble swallowing, dental problem, postnasal drip and sinus pressure.   Eyes: Negative for redness and itching.  Respiratory: Positive for cough, shortness of breath and wheezing. Negative for chest tightness.   Cardiovascular: Negative for palpitations and leg swelling.  Gastrointestinal: Negative for nausea and vomiting.  Genitourinary: Negative for dysuria.  Musculoskeletal: Negative for joint swelling.  Skin: Negative for rash.  Neurological: Negative for headaches.  Hematological: Does not bruise/bleed easily.  Psychiatric/Behavioral: Negative for dysphoric mood. The patient is not nervous/anxious.   All other systems reviewed and are negative.       Objective:   Physical Exam Well-developed male in no acute distress Nose without purulence or discharge noted Chest with mild decrease in breath sounds, no true wheezing noted, mild cough Cardiac exam is regular rate and rhythm Lower extremities with no significant edema, cyanosis Alert and oriented, moves all 4 extremities.       Assessment & Plan:

## 2011-12-03 NOTE — Assessment & Plan Note (Signed)
The patient has had a little more increased dyspnea on exertion, but his primary complaint has been a cough.  He is having continual postnasal drip, and is not producing any purulent mucus.  He does not feel that he has a chest cold or chest congestion.  At this point, I would like to treat him with a short course of prednisone to help decrease airway inflammation, and have also recommended chlorpheniramine for his postnasal drip.

## 2011-12-03 NOTE — Telephone Encounter (Signed)
Noted and will forward to Lawson Fiscal to keep an eye out for fax

## 2011-12-05 NOTE — Telephone Encounter (Signed)
[name], please advise if you have the form from [place] thanks

## 2011-12-05 NOTE — Telephone Encounter (Signed)
Called to check on status.  [name]

## 2011-12-08 ENCOUNTER — Telehealth: Payer: Self-pay | Admitting: Pulmonary Disease

## 2011-12-08 NOTE — Telephone Encounter (Signed)
I looked through [facility] paperwork sicne Lawson Fiscal is out of the office and do not see this form. I also checked with Alida and she has not seen this form either. I ATCx1 the number given and pharmacy is closed. They Open at 8am Pacific time, so will call back.Carron Curie, CMA

## 2011-12-08 NOTE — Telephone Encounter (Signed)
Duplicate message. Jennifer Castillo, CMA  

## 2011-12-08 NOTE — Telephone Encounter (Signed)
I spoke with [name] and advised we do not have the fax. I asked her to fax it to triage fax. Will await fax. Carron Curie, CMA

## 2011-12-09 NOTE — Telephone Encounter (Signed)
I do not see this on triage fax.  Jenn, did you happened to get this yesterday?

## 2011-12-09 NOTE — Telephone Encounter (Signed)
Spoke with [facility].  Still have not received this fax.  I called [facility] but they are closed.  They are on Pacific Time.  8am - 5 pm.

## 2011-12-09 NOTE — Telephone Encounter (Signed)
Form placed in [facility] look-at folder.Carron Curie, CMA

## 2011-12-09 NOTE — Telephone Encounter (Signed)
Received fax today for rx from [facility] for budesonide. Form given to Jennifer/Dr Clance for sig. Kandice Hams

## 2011-12-10 NOTE — Telephone Encounter (Signed)
Jovita w/ [facility] called to check on status of document she faxed over.  [name]

## 2011-12-10 NOTE — Telephone Encounter (Signed)
Spoke with [name] and notified that [facility] has the form and we will fax it back once it is complete. She verbalized understanding and states nothing further needed.

## 2011-12-19 ENCOUNTER — Telehealth: Payer: Self-pay | Admitting: Internal Medicine

## 2011-12-19 NOTE — Telephone Encounter (Signed)
[name]/ Daughter in law states [name] has had  sore  2 to 3 inches above ankle - 1/4 inch by 1/3 inch x one week . States area is  red and wet looking. Scratches area while asleep.  Sore to touch. No red streaks noted. Was " seen in clinic earlier this year for same problem and was ordered Mupiricin and an antibiotic.  Have been using Mupiricin x one week with no improvement." States he may need  antibiotics. Per leg non injury protocol "has see provider within 4 hrs due to signs and symptoms of local infection which is not improving." Declined emergency room or urgent care.  States he has already been out x one on 12/19/11. Requesting appointment for 12/20/11. Appointment scheduled for 12/20/11 at 1045 in [facility] at [facility]. Care advice given.

## 2011-12-20 ENCOUNTER — Ambulatory Visit (INDEPENDENT_AMBULATORY_CARE_PROVIDER_SITE_OTHER): Payer: Medicare Other | Admitting: Family Medicine

## 2011-12-20 ENCOUNTER — Encounter: Payer: Self-pay | Admitting: Family Medicine

## 2011-12-20 VITALS — BP 108/64 | HR 67 | Temp 97.2°F | Wt 177.0 lb

## 2011-12-20 DIAGNOSIS — L089 Local infection of the skin and subcutaneous tissue, unspecified: Secondary | ICD-10-CM

## 2011-12-20 DIAGNOSIS — T148XXA Other injury of unspecified body region, initial encounter: Secondary | ICD-10-CM

## 2011-12-20 MED ORDER — DOXYCYCLINE HYCLATE 100 MG PO TABS: 100.0000 mg | ORAL_TABLET | Freq: Two times a day (BID) | ORAL | Status: AC

## 2011-12-20 NOTE — Patient Instructions (Signed)
Wound Infection  A wound infection happens when a type of germ (bacteria) starts growing in the wound. In some cases, this can cause the wound to break open. If cared for properly, the infected wound will heal from the inside to the outside. Wound infections need treatment.  CAUSES  An infection is caused by bacteria growing in the wound.   SYMPTOMS    Increase in redness, swelling, or pain at the wound site.   Increase in drainage at the wound site.   Wound or bandage (dressing) starts to smell bad.   Fever.   Feeling tired or fatigued.   Pus draining from the wound.  TREATMENT   You caregiver will prescribe antibiotic medicine. The wound infection should improve within 24 to 48 hours. Any redness around the wound should stop spreading and the wound should be less painful.   HOME CARE INSTRUCTIONS    Only take over-the-counter or prescription medicines for pain, discomfort, or fever as directed by your caregiver.   Take your antibiotics as directed. Finish them even if you start to feel better.   Gently wash the area with mild soap and water 2 times a day, or as directed. Rinse off the soap. Pat the area dry with a clean towel. Do not rub the wound. This may cause bleeding.   Follow your caregiver's instructions for how often you need to change the dressing.   Apply ointment and a dressing to the wound as directed.   If the dressing sticks, moisten it with soapy water and gently remove it.   Change the bandage right away if it becomes wet, dirty, or develops a bad smell.   Take showers. Do not take tub baths, swim, or do anything that may soak the wound until it is healed.   Avoid exercises that make you sweat heavily.   Use anti-itch medicine as directed by your caregiver. The wound may itch when it is healing. Do not pick or scratch at the wound.   Follow up with your caregiver to get your wound rechecked as directed.  SEEK MEDICAL CARE IF:   You have an increase in swelling, pain, or redness  around the wound.   You have an increase in the amount of pus coming from the wound.   There is a bad smell coming from the wound.   More of the wound breaks open.   You have a fever.  MAKE SURE YOU:    Understand these instructions.   Will watch your condition.   Will get help right away if you are not doing well or get worse.  Document Released: 12/28/2002 Document Revised: 03/20/2011 Document Reviewed: 08/04/2010  Exit[employer] Patient Information 2012 [employer].

## 2011-12-20 NOTE — Progress Notes (Signed)
  Subjective:    Patient ID: [name], male    DOB: [birth year], 78 y.o.   MRN: [id number]  HPI Pt here with caretaker c/o scratch on R low leg that is red.  It was painful yesterday but is better today. No other complaints   Review of Systems As above    Objective:   Physical Exam  Constitutional: He appears well-developed and well-nourished.  Skin:       Rlow leg--laterally---+ abrasion with surrounding errythema, no drainage,  Dry, warm to touch  Psychiatric: He has a normal mood and affect. His behavior is normal. Judgment and thought content normal.   Filed Vitals:   12/20/11 1124  BP: 108/64  Pulse: 67  Temp: 97.2 F (36.2 C)  TempSrc: Oral  Weight: 177 lb (80.287 kg)  SpO2: 91%  repeat spO2  95%       Assessment & Plan:  Wound infection---  abx for 10 days                                 Wound cleaned --abx ointment and bandage put in place                                   rto prn

## 2011-12-28 ENCOUNTER — Emergency Department (HOSPITAL_COMMUNITY)
Admission: EM | Admit: 2011-12-28 | Discharge: 2011-12-28 | Disposition: A | Discharge status: Home / Self Care | Payer: Medicare Other | Attending: Emergency Medicine | Admitting: Emergency Medicine

## 2011-12-28 ENCOUNTER — Encounter (HOSPITAL_COMMUNITY): Payer: Self-pay | Admitting: Emergency Medicine

## 2011-12-28 ENCOUNTER — Emergency Department (HOSPITAL_COMMUNITY): Payer: Medicare Other

## 2011-12-28 DIAGNOSIS — I2510 Atherosclerotic heart disease of native coronary artery without angina pectoris: Secondary | ICD-10-CM | POA: Insufficient documentation

## 2011-12-28 DIAGNOSIS — F0390 Unspecified dementia without behavioral disturbance: Secondary | ICD-10-CM | POA: Insufficient documentation

## 2011-12-28 DIAGNOSIS — Z9089 Acquired absence of other organs: Secondary | ICD-10-CM | POA: Insufficient documentation

## 2011-12-28 DIAGNOSIS — I10 Essential (primary) hypertension: Secondary | ICD-10-CM | POA: Insufficient documentation

## 2011-12-28 DIAGNOSIS — E7800 Pure hypercholesterolemia, unspecified: Secondary | ICD-10-CM | POA: Insufficient documentation

## 2011-12-28 DIAGNOSIS — Z79899 Other long term (current) drug therapy: Secondary | ICD-10-CM | POA: Insufficient documentation

## 2011-12-28 DIAGNOSIS — R739 Hyperglycemia, unspecified: Secondary | ICD-10-CM

## 2011-12-28 DIAGNOSIS — J441 Chronic obstructive pulmonary disease with (acute) exacerbation: Secondary | ICD-10-CM

## 2011-12-28 LAB — CBC WITH DIFFERENTIAL/PLATELET
Basophils Absolute: 0 10*3/uL (ref 0.0–0.1)
Basophils Relative: 0 % (ref 0–1)
Eosinophils Absolute: 0.2 10*3/uL (ref 0.0–0.7)
Eosinophils Relative: 3 % (ref 0–5)
Lymphs Abs: 1.7 10*3/uL (ref 0.7–4.0)
MCH: 30.2 pg (ref 26.0–34.0)
MCHC: 32 g/dL (ref 30.0–36.0)
Neutrophils Relative %: 66 % (ref 43–77)
Platelets: 195 10*3/uL (ref 150–400)
RBC: 4.53 MIL/uL (ref 4.22–5.81)
RDW: 12.9 % (ref 11.5–15.5)

## 2011-12-28 LAB — GLUCOSE, CAPILLARY: Glucose-Capillary: 190 mg/dL — ABNORMAL HIGH (ref 70–99)

## 2011-12-28 LAB — POCT I-STAT TROPONIN I: Troponin i, poc: 0 ng/mL (ref 0.00–0.08)

## 2011-12-28 LAB — COMPREHENSIVE METABOLIC PANEL
ALT: 13 U/L (ref 0–53)
Albumin: 4 g/dL (ref 3.5–5.2)
Alkaline Phosphatase: 77 U/L (ref 39–117)
Calcium: 9.5 mg/dL (ref 8.4–10.5)
Potassium: 3.8 mEq/L (ref 3.5–5.1)
Sodium: 139 mEq/L (ref 135–145)
Total Protein: 7 g/dL (ref 6.0–8.3)

## 2011-12-28 MED ORDER — ALBUTEROL (5 MG/ML) CONTINUOUS INHALATION SOLN
10.0000 mg/h | INHALATION_SOLUTION | Freq: Once | RESPIRATORY_TRACT | Status: AC
  Administered 2011-12-28: 10 mg/h via RESPIRATORY_TRACT

## 2011-12-28 MED ORDER — ALBUTEROL SULFATE (5 MG/ML) 0.5% IN NEBU
INHALATION_SOLUTION | RESPIRATORY_TRACT | Status: AC
  Filled 2011-12-28: qty 2

## 2011-12-28 MED ORDER — METHYLPREDNISOLONE SODIUM SUCC 125 MG IJ SOLR
INTRAMUSCULAR | Status: AC
  Filled 2011-12-28: qty 2

## 2011-12-28 MED ORDER — PREDNISONE 10 MG PO TABS: ORAL_TABLET | ORAL | Status: DC

## 2011-12-28 MED ORDER — ALBUTEROL SULFATE (5 MG/ML) 0.5% IN NEBU: 5.0000 mg | INHALATION_SOLUTION | Freq: Once | RESPIRATORY_TRACT | Status: DC

## 2011-12-28 MED ORDER — ALBUTEROL SULFATE (5 MG/ML) 0.5% IN NEBU
INHALATION_SOLUTION | RESPIRATORY_TRACT | Status: AC
  Administered 2011-12-28: 12:00:00
  Filled 2011-12-28: qty 2

## 2011-12-28 MED ORDER — IPRATROPIUM BROMIDE 0.02 % IN SOLN
RESPIRATORY_TRACT | Status: AC
  Filled 2011-12-28: qty 2.5

## 2011-12-28 NOTE — ED Provider Notes (Signed)
History     CSN: [id number]  Arrival date & time 12/28/11  1037   First MD Initiated Contact with Patient 12/28/11 1052      Chief Complaint  Patient presents with  . Shortness of Breath    (Consider location/radiation/quality/duration/timing/severity/associated sxs/prior treatment) HPI Comments: [name] is a 78 y.o. Male who presents with complaint of difficulty breathing, cough. Pt has hx of COPD with chronic respiratory failure, based on chart review pt is on home O2 3L. EMS apparently was called by pt's family for increased breathing problems this morning. Pt denies any complaints. He is demented, poor historian. He received two nebs by EMS and solumedrol 125mg  IV. Pt's family is not here yet, so unable to get any more hx at this time.   The history is provided by the patient.    Past Medical History  Diagnosis Date  . Bradycardia   . dementia   . mass, lung right lilum     patient had an abnormal CT chest 2+ years ago after infection: changes thougth to be a reaction and did clear.   . Shortness of breath   . weakness   . Chronic respiratory failure   . Obstructive sleep apnea (adult) (pediatric)   . allergi rhin, chronic   . Emphysema   . inguinal hernia, right   . Other specified disorder of skin   . COPD (chronic obstructive pulmonary disease)   . varicose vein     mild  . Hypertension   . coronary artery disease   . History of cardiac cath 06/03/04    acute info post MI, stent PICA  . History of cardiac cath 06/03/04    acute info post MI, stent PICA  . Asthma   . diabetes mell, type 2   . Pure hypercholesterolemia     Past Surgical History  Procedure Date  . Cholecystectomy 01/26/03-01/31/2003     [facility] ERCP, cholecystitis  . Stress/adenosine myoview 03/25/04    abnormal EF 50%  . Total hip arthroplasty 02/06    Left, anemia 2 units PRBC  . Adenosine myoview 08/13/05    EF 46%  . Total hip arthroplasty 05/31/05    right  . Ct angio chest  10/28/08    Neg PE, 3x3x2.2 cm right hilar mass  . Pet scan 11/02/08    Inflam/Infect Atelect RUL w/reactive lymphadenopathy  . Hernia repair 01/00    left recurrent    Family History  Problem Relation Age of Onset  . Cancer Sister     breast  . Diabetes Brother   . Hypertension Brother   . Hyperlipidemia Brother   . Parkinsonism Brother   . Cancer Sister     History  Substance Use Topics  . Smoking status: Former Smoker -- 1.0 packs/day for 25 years    Types: Cigarettes    Quit date: 08/12/1960  . Smokeless tobacco: Never Used  . Alcohol Use: No      Review of Systems  Unable to perform ROS: Dementia  Respiratory: Positive for cough and shortness of breath. Negative for chest tightness.   Cardiovascular: Negative for chest pain and leg swelling.    Allergies  Amoxicillin; Clarithromycin; Latex; and Penicillins  Home Medications   Current Outpatient Rx  Name Route Sig Dispense Refill  . ALBUTEROL SULFATE HFA 108 (90 BASE) MCG/ACT IN AERS Inhalation Inhale 2 puffs into the lungs every 4 (four) hours as needed. For wheezing/shortness of breath    . ALBUTEROL SULFATE (  2.5 MG/3ML) 0.083% IN NEBU Nebulization Take 2.5 mg by nebulization every 6 (six) hours as needed.    . ASPIRIN EC 81 MG PO TBEC Oral Take 162 mg by mouth daily.     . BUDESONIDE 0.5 MG/2ML IN SUSP Nebulization Take 0.5 mg by nebulization 2 (two) times daily.    Marland Kitchen VITAMIN D 1000 UNITS PO TABS Oral Take 1,000 Units by mouth daily.      Marland Kitchen CLOPIDOGREL BISULFATE 75 MG PO TABS Oral Take 75 mg by mouth daily.    Marland Kitchen CLOPIDOGREL BISULFATE 75 MG PO TABS  TAKE 1 TABLET EVERY DAY 30 tablet 1  . DONEPEZIL HCL 10 MG PO TABS  ONE TAB BY MOUTH ONCE DAILY 30 tablet 11  . DOXYCYCLINE HYCLATE 100 MG PO TABS Oral Take 1 tablet (100 mg total) by mouth 2 (two) times daily. 20 tablet 0  . FUROSEMIDE 20 MG PO TABS  TAKE 1 TABLET EVERY DAY 90 tablet 1  . GLIMEPIRIDE 4 MG PO TABS Oral Take 4 mg by mouth daily before breakfast.     . HYDROCODONE-HOMATROPINE 5-1.5 MG/5ML PO SYRP Oral Take 5 mLs by mouth every 6 (six) hours as needed. For cough    . IPRATROPIUM-ALBUTEROL 0.5-2.5 (3) MG/3ML IN SOLN Nebulization Take 3 mLs by nebulization 4 (four) times daily.    Marland Kitchen LOSARTAN POTASSIUM 50 MG PO TABS Oral Take 50 mg by mouth daily.    Marland Kitchen MEMANTINE HCL 10 MG PO TABS Oral Take 10 mg by mouth 2 (two) times daily.    . ADULT MULTIVITAMIN W/MINERALS CH Oral Take 1 tablet by mouth daily.    Marland Kitchen MUPIROCIN CALCIUM 2 % NA OINT Nasal Place into the nose 3 (three) times daily. Apply a little on a q-tip to lesion in left nostril 3 times a day.    Marland Kitchen POTASSIUM CHLORIDE CRYS ER 20 MEQ PO TBCR Oral Take 20 mEq by mouth daily.     Marland Kitchen PREDNISONE 10 MG PO TABS  Take 4 x 2 days, 2 x 2 days, 1 x 2 days then stop. 14 tablet 0  . SERTRALINE HCL 50 MG PO TABS Oral Take 50 mg by mouth at bedtime.    Marland Kitchen SIMVASTATIN 80 MG PO TABS Oral Take 40 mg by mouth at bedtime.      BP 148/55  Pulse 84  Resp 22  SpO2 100%  Physical Exam  Nursing note and vitals reviewed. Constitutional: He appears well-developed and well-nourished. No distress.  HENT:  Head: Normocephalic.  Eyes: Conjunctivae normal are normal.  Neck: Neck supple.  Cardiovascular: Normal rate, regular rhythm and normal heart sounds.   Pulmonary/Chest: He has wheezes. He has rales.       Pt with diffuse inspiratory and expiratory wheezing in all lung fields, he is coughing  Abdominal: Soft. Bowel sounds are normal. He exhibits no distension. There is no tenderness.  Musculoskeletal: He exhibits no edema.  Neurological: He is alert.       Oriented to self only  Skin: Skin is warm and dry.  Psychiatric: He has a normal mood and affect.    ED Course  Procedures (including critical care time)  Pt with increased SOB this morning. Hx of COPD, on home O2. Pt is currently in no  Disterss. He is laying in the stretcher comfortably, no accessory muscle use for breathing, speaking full sentences. He  is wheezing on exam. Will get CXR, labs, nebs. Will monitor.    Date: 12/28/2011  Rate:  81  Rhythm: normal sinus rhythm  QRS Axis: normal  Intervals: normal  ST/T Wave abnormalities: nonspecific T wave changes  Conduction Disutrbances:none  Narrative Interpretation:   Old EKG Reviewed: unchanged    Results for orders placed during the hospital encounter of 12/28/11  CBC WITH DIFFERENTIAL      Component Value Range   WBC 7.7  4.0 - 10.5 K/uL   RBC 4.53  4.22 - 5.81 MIL/uL   Hemoglobin 13.7  13.0 - 17.0 g/dL   HCT 16.1  09.6 - 04.5 %   MCV 94.5  78.0 - 100.0 fL   MCH 30.2  26.0 - 34.0 pg   MCHC 32.0  30.0 - 36.0 g/dL   RDW 40.9  81.1 - 91.4 %   Platelets 195  150 - 400 K/uL   Neutrophils Relative 66  43 - 77 %   Neutro Abs 5.1  1.7 - 7.7 K/uL   Lymphocytes Relative 22  12 - 46 %   Lymphs Abs 1.7  0.7 - 4.0 K/uL   Monocytes Relative 9  3 - 12 %   Monocytes Absolute 0.7  0.1 - 1.0 K/uL   Eosinophils Relative 3  0 - 5 %   Eosinophils Absolute 0.2  0.0 - 0.7 K/uL   Basophils Relative 0  0 - 1 %   Basophils Absolute 0.0  0.0 - 0.1 K/uL  COMPREHENSIVE METABOLIC PANEL      Component Value Range   Sodium 139  135 - 145 mEq/L   Potassium 3.8  3.5 - 5.1 mEq/L   Chloride 98  96 - 112 mEq/L   CO2 31  19 - 32 mEq/L   Glucose, Bld 181 (*) 70 - 99 mg/dL   BUN 17  6 - 23 mg/dL   Creatinine, Ser 7.82  0.50 - 1.35 mg/dL   Calcium 9.5  8.4 - 95.6 mg/dL   Total Protein 7.0  6.0 - 8.3 g/dL   Albumin 4.0  3.5 - 5.2 g/dL   AST 18  0 - 37 U/L   ALT 13  0 - 53 U/L   Alkaline Phosphatase 77  39 - 117 U/L   Total Bilirubin 0.4  0.3 - 1.2 mg/dL   GFR calc non Af Amer 75 (*) >90 mL/min   GFR calc Af Amer 87 (*) >90 mL/min  POCT I-STAT TROPONIN I      Component Value Range   Troponin i, poc 0.00  0.00 - 0.08 ng/mL   Comment 3           GLUCOSE, CAPILLARY      Component Value Range   Glucose-Capillary 190 (*) 70 - 99 mg/dL   Dg Chest 2 View  05/28/863  *RADIOLOGY REPORT*  Clinical  Data: COPD exacerbation shortness of breath.  CHEST - 2 VIEW  Comparison: Portable chest x-ray 10/05/2011.  Two-view chest x-ray 07/09/2011, 04/28/2011, 11/06/2010.  Findings: Cardiac silhouette normal in size, unchanged.  Thoracic aorta mildly tortuous and atherosclerotic, unchanged.  Mildly prominent central pulmonary arteries, unchanged.  Bullous emphysematous changes in the upper lobes.  Scarring in the lingula and both lower lobes, unchanged.  No new pulmonary parenchymal abnormalities.  No pleural effusions.  Degenerative changes and DISH involving the thoracic spine.  No significant interval change.  IMPRESSION: COPD/emphysema.  Scarring in the lower lobes and lingula.  No acute cardiopulmonary disease.  Stable examination.   Original Report Authenticated By: Arnell Sieving, M.D.    1:13 PM Pt received 1hr  neb, his wheezing improved, and he is feeling at his baseline. Per daughter, pt is at baseline. CXR negative. Labs unremarkable. Suspect exacerbation of his COPD. He is afebrile, no WBC. Will start on prednisone at home, cont nebs, follow up closely with his pcp this week.    1. COPD exacerbation   2. Hyperglycemia       MDM  Pt with advanced COPD on 3L home oxygen. Here with increased shortness of breath. During ems transport and ED visit, pt received iv solumedrol and several neb treatments with much improvement. His wheezing improved and he does not appear to be in respiratory distress. He is speaking full sentences. His labs and CXR are unremarkable. He will be started on home steroids and follow up closely with his pcp.         Lottie Mussel, [place] 12/28/11 2021

## 2011-12-28 NOTE — ED Notes (Signed)
Pt reported feeling shaky and hands shaky, PA alerted.pt did just finish continous hour long treatment.   PA at bedside

## 2011-12-28 NOTE — ED Notes (Signed)
EMS reports patient began having shortness of breath this morning.  Patient reports non-productive cough and denies fevers.  Patient has h/o COPD and is former smoker.  Expiratory/inspiratory wheezes heard throughout.  Patient tolerating well.

## 2011-12-28 NOTE — ED Notes (Signed)
YNW:[id number]<OZ> Expected date:12/28/11<BR> Expected time:10:32 AM<BR> Means of arrival:Ambulance<BR> Comments:<BR> Shob

## 2011-12-31 NOTE — ED Provider Notes (Signed)
Medical screening examination/treatment/procedure(s) were conducted as a shared visit with non-physician practitioner(s) and myself.  I personally evaluated the patient during the encounter  On my exam the patient had already received treatment was subjectively better (per his family member).  He was in no distress, speaking clearly.  D/C home w PMD F/U.  I saw the ECG, relevant labs and studies - I agree with the interpretation.   Gerhard Munch, MD 12/31/11 [phone]

## 2012-01-01 ENCOUNTER — Encounter: Payer: Self-pay | Admitting: Internal Medicine

## 2012-01-01 ENCOUNTER — Ambulatory Visit (INDEPENDENT_AMBULATORY_CARE_PROVIDER_SITE_OTHER): Payer: Medicare Other | Admitting: Internal Medicine

## 2012-01-01 VITALS — BP 120/60 | HR 78 | Temp 97.2°F | Resp 14

## 2012-01-01 DIAGNOSIS — J9610 Chronic respiratory failure, unspecified whether with hypoxia or hypercapnia: Secondary | ICD-10-CM

## 2012-01-01 NOTE — Progress Notes (Signed)
Subjective:    Patient ID: [name], male    DOB: August 11, 1924, 78 y.o.   MRN: [id number]  HPI [name] presents for ED follow-up having been seen Sept 15th-records reviewed. He presented with acute exacerbation of COPD - had diffuse wheezing and SOB. CXR- NAD, EKG normal, CBC, BMet normal. He was given a 1 hr neb treatment and solumedrol IV. He was sent home on a tapering dose of prednisone. Since hospital d/c he has been doing better. He still has marked DOE but wheezing is better.   Past Medical History  Diagnosis Date  . Bradycardia   . dementia   . mass, lung right lilum     patient had an abnormal CT chest 2+ years ago after infection: changes thougth to be a reaction and did clear.   . Shortness of breath   . weakness   . Chronic respiratory failure   . Obstructive sleep apnea (adult) (pediatric)   . allergi rhin, chronic   . Emphysema   . inguinal hernia, right   . Other specified disorder of skin   . COPD (chronic obstructive pulmonary disease)   . varicose vein     mild  . Hypertension   . coronary artery disease   . History of cardiac cath 06/03/04    acute info post MI, stent PICA  . History of cardiac cath 06/03/04    acute info post MI, stent PICA  . Asthma   . diabetes mell, type 2   . Pure hypercholesterolemia    Past Surgical History  Procedure Date  . Cholecystectomy 01/26/03-01/31/2003     [facility] ERCP, cholecystitis  . Stress/adenosine myoview 03/25/04    abnormal EF 50%  . Total hip arthroplasty 02/06    Left, anemia 2 units PRBC  . Adenosine myoview 08/13/05    EF 46%  . Total hip arthroplasty 05/31/05    right  . Ct angio chest 10/28/08    Neg PE, 3x3x2.2 cm right hilar mass  . Pet scan 11/02/08    Inflam/Infect Atelect RUL w/reactive lymphadenopathy  . Hernia repair 01/00    left recurrent   Family History  Problem Relation Age of Onset  . Cancer Sister     breast  . Diabetes Brother   . Hypertension Brother   . Hyperlipidemia Brother    . Parkinsonism Brother   . Cancer Sister    History   Social History  . Marital Status: Married    Spouse Name: N/A    Number of Children: 2  . Years of Education: 8   Occupational History  . 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     Social History Main Topics  . Smoking status: Former Smoker -- 1.0 packs/day for 25 years    Types: Cigarettes    Quit date: 08/12/1960  . Smokeless tobacco: Never Used  . Alcohol Use: No  . Drug Use: No  . Sexually Active: Not Currently   Other Topics Concern  . Not on file   Social History Narrative   Finished 8th grade. Married - 1946 -  . 1 son - '89; 1 dtr - '55;  3 grandchildren. 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  Lives alone with wife. End-of-Life Measures: no Cardiac resuscitation, no mechanical ventilation, no heroic or futile measures.     Current Outpatient Prescriptions  on File Prior to Visit  Medication Sig Dispense Refill  . acetaminophen (TYLENOL) 500 MG tablet Take 500 mg by mouth every 6 (six) hours as needed. Pain      . albuterol (PROVENTIL HFA;VENTOLIN HFA) 108 (90 BASE) MCG/ACT inhaler Inhale 2 puffs into the lungs every 4 (four) hours as needed. For wheezing/shortness of breath      . albuterol (PROVENTIL) (2.5 MG/3ML) 0.083% nebulizer solution Take 2.5 mg by nebulization every 4 (four) hours as needed. Shortness of breath      . aspirin EC 81 MG EC tablet Take 162 mg by mouth daily.       . budesonide (PULMICORT) 0.5 MG/2ML nebulizer solution Take 0.5 mg by nebulization 2 (two) times daily.      . chlorpheniramine (CHLOR-TRIMETON) 4 MG tablet Take 4 mg by mouth daily.      . cholecalciferol (VITAMIN D) 1000 UNITS tablet Take 1,000 Units by mouth daily.        . clopidogrel (PLAVIX) 75 MG tablet Take 75 mg by mouth daily.      Marland Kitchen donepezil (ARICEPT) 10 MG tablet Take 10 mg by mouth at bedtime.      . furosemide  (LASIX) 20 MG tablet TAKE 1 TABLET EVERY DAY  90 tablet  1  . glimepiride (AMARYL) 4 MG tablet Take 4 mg by mouth daily before breakfast.      . HYDROcodone-homatropine (HYCODAN) 5-1.5 MG/5ML syrup Take 5 mLs by mouth every 6 (six) hours as needed. For cough      . ipratropium-albuterol (DUONEB) 0.5-2.5 (3) MG/3ML SOLN Take 3 mLs by nebulization 4 (four) times daily.      Marland Kitchen LORazepam (ATIVAN) 0.5 MG tablet Take 0.5 mg by mouth as needed. Anxiety      . losartan (COZAAR) 50 MG tablet Take 50 mg by mouth daily.      . memantine (NAMENDA) 10 MG tablet Take 10 mg by mouth 2 (two) times daily.      . Multiple Vitamin (MULTIVITAMIN WITH MINERALS) TABS Take 1 tablet by mouth daily.      . mupirocin nasal ointment (BACTROBAN) 2 % Place into the nose 3 (three) times daily. Apply a little on a q-tip to lesion in left nostril 3 times a day.      . potassium chloride SA (K-DUR,KLOR-CON) 20 MEQ tablet Take 20 mEq by mouth daily.       . predniSONE (DELTASONE) 10 MG tablet Take 6 tab day 1, take 5 tab day 2, take 4 tab day 3, take 3 tab day 4, and take 2 tab day 5, take 1 tab day 6  21 tablet  0  . pseudoephedrine-guaifenesin (MUCINEX D) 60-600 MG per tablet Take 1 tablet by mouth every 12 (twelve) hours.      . sertraline (ZOLOFT) 50 MG tablet Take 50 mg by mouth at bedtime.      . simvastatin (ZOCOR) 80 MG tablet Take 40 mg by mouth at bedtime.          Review of Systems System review is negative for any constitutional, cardiac, pulmonary, GI or neuro symptoms or complaints other than as described in the HPI.     Objective:   Physical Exam Filed Vitals:   01/01/12 1534  BP: 120/60  Pulse: 78  Temp: 97.2 F (36.2 C)  Resp: 14   O2 Sat 95%  Gen'l- elderly white man in no distress Cor- RRR Pulm - shallow inspiration, no wheezing at all  Assessment & Plan:

## 2012-01-04 NOTE — Assessment & Plan Note (Signed)
Patient had an acute  Exacerbation of COPD with wheezing and hypoxemia that resolved with nebulizer treatment x 1 hr and steroids in the ED. In follow up he is doing well.  Plan - no change in maintenance regimen.

## 2012-01-12 ENCOUNTER — Ambulatory Visit: Payer: Medicare Other | Admitting: Internal Medicine

## 2012-01-13 ENCOUNTER — Other Ambulatory Visit: Payer: Self-pay | Admitting: *Deleted

## 2012-01-13 ENCOUNTER — Other Ambulatory Visit: Payer: Self-pay | Admitting: Cardiovascular Disease

## 2012-01-13 MED ORDER — CLOPIDOGREL BISULFATE 75 MG PO TABS: 75.0000 mg | ORAL_TABLET | Freq: Every day | ORAL | Status: DC

## 2012-01-13 NOTE — Telephone Encounter (Signed)
Refilled Clopidogrel. 

## 2012-01-23 ENCOUNTER — Telehealth: Payer: Self-pay | Admitting: Pulmonary Disease

## 2012-01-23 MED ORDER — BENZONATATE 100 MG PO CAPS
100.0000 mg | ORAL_CAPSULE | Freq: Four times a day (QID) | ORAL | Status: DC | PRN
Start: 2012-01-23 — End: 2012-03-26

## 2012-01-23 NOTE — Telephone Encounter (Signed)
Spoke with [name]. She states that the pt is having increased cough for the past 2-3 days- worse esp at hs and with exertion, non prod cough. She states that the cough "comes in spells" and he coughs until he can't breathe. Taking hycodan with no relief at all. He also had a hallucination earlier today while eating lunch, he kept reaching for something on the table that he thought was a baby and he didn't want the baby to fall off of the table. She states that this is a first. KC, please advise, thanks! Allergies  Allergen Reactions  . Amoxicillin     REACTION: unspecified  . Clarithromycin     REACTION: UNSPECIFIED  . Latex Hives  . Penicillins     REACTION: unspecified

## 2012-01-23 NOTE — Telephone Encounter (Signed)
If the hycodan doesn't help cough, there isn't anything stronger I can call in.  Can try tessalon pearls 100mg , take one every 6 hrs if needed.,  #30, one fill.  Would not give abx unless he is producing significant mucus that is discolored. Regarding the mentation changes, need to call his primary md to let them know.  If worsens, go to ER.

## 2012-01-23 NOTE — Telephone Encounter (Signed)
I spoke with [name] and is aware of KC response. She stated she would like the tessalon pearls called in for pt. She will call PCP regarding the other situation. Nothing further was needed

## 2012-01-28 ENCOUNTER — Ambulatory Visit (INDEPENDENT_AMBULATORY_CARE_PROVIDER_SITE_OTHER): Payer: Medicare Other | Admitting: Pulmonary Disease

## 2012-01-28 ENCOUNTER — Telehealth: Payer: Self-pay | Admitting: Pulmonary Disease

## 2012-01-28 ENCOUNTER — Ambulatory Visit: Payer: Medicare Other | Admitting: Pulmonary Disease

## 2012-01-28 ENCOUNTER — Encounter: Payer: Self-pay | Admitting: Pulmonary Disease

## 2012-01-28 VITALS — BP 130/72 | HR 79 | Ht 70.0 in | Wt 179.0 lb

## 2012-01-28 DIAGNOSIS — J9610 Chronic respiratory failure, unspecified whether with hypoxia or hypercapnia: Secondary | ICD-10-CM

## 2012-01-28 DIAGNOSIS — J438 Other emphysema: Secondary | ICD-10-CM

## 2012-01-28 DIAGNOSIS — R05 Cough: Secondary | ICD-10-CM

## 2012-01-28 MED ORDER — PREDNISONE 10 MG PO TABS: ORAL_TABLET | ORAL | Status: DC

## 2012-01-28 NOTE — Assessment & Plan Note (Signed)
The patient appears to be near baseline from a pulmonary standpoint.  I've asked him to continue on his current bronchodilator regimen.

## 2012-01-28 NOTE — Patient Instructions (Addendum)
Will treat with a short course of prednisone for your severe allergy symptoms and postnasal drip.  However, you may have to consider a different nasal spray called dymista if you cannot tolerate stronger oral antihistamines. No change in breathing medications. followup with me in 4mos, but call if not getting better.

## 2012-01-28 NOTE — Telephone Encounter (Signed)
Spoke with patients daughter Verland Sprinkle) and states that patient seen today; Patient unable to tolerate Chlorpheniramine and pt has never had any relief with Zyrtec. Is it okay to take Allegra to help with nasal congestion? Or any other recs?? Patient daughter [name]) aware that she may not get a response until tomorrow morning.   Dr Shelle Iron please advise. Thanks.

## 2012-01-28 NOTE — Telephone Encounter (Signed)
Yes, allegra ok to try.

## 2012-01-28 NOTE — Progress Notes (Signed)
  Subjective:    Patient ID: [name], male    DOB: 01/07/1925, 78 y.o.   MRN: [id number]  HPI The patient comes in today for followup of his known severe emphysema with chronic respiratory failure.  His main complaint today is that of chronic cough that is clearly coming from his upper airway.  He has had significant rhinorrhea and postnasal drip, but unfortunately is not able to take chlorpheniramine due to over sedation.  He did not see any improvement with Zyrtec.  The patient denies any chest congestion, or purulent mucus.  His dyspnea on exertion is not far from baseline.   Review of Systems  Constitutional: Negative for fever and unexpected weight change.  HENT: Positive for rhinorrhea. Negative for ear pain, nosebleeds, congestion, sore throat, sneezing, trouble swallowing, dental problem, postnasal drip and sinus pressure.   Eyes: Negative for redness and itching.  Respiratory: Positive for cough, chest tightness, shortness of breath and wheezing.   Cardiovascular: Negative for palpitations and leg swelling.  Gastrointestinal: Negative for nausea and vomiting.  Genitourinary: Negative for dysuria.  Musculoskeletal: Negative for joint swelling.  Skin: Negative for rash.  Neurological: Negative for headaches.  Hematological: Does not bruise/bleed easily.  Psychiatric/Behavioral: Negative for dysphoric mood. The patient is not nervous/anxious.        Objective:   Physical Exam Overweight male in no acute distress Nose without purulence or discharge noted Oropharynx with secretions in the back of the throat, no exudates Neck without lymphadenopathy or thyromegaly, but very prominent pseudo-wheezes heard over the laryngeal area. Chest with good air flow, and no wheezes.  transmitted coarse breath sounds from the upper airway noted. Cardiac exam with regular rate and rhythm Lower extremities with no significant edema, no cyanosis Alert and oriented, moves all 4  extremities.       Assessment & Plan:

## 2012-01-28 NOTE — Assessment & Plan Note (Signed)
The patient continues to have cough that is clearly from his upper airway.  He is having rhinorrhea and postnasal drip.  I have discussed with him trying a nasal antihistamine or possibly Atrovent nasal spray, however the daughter is hesitant to institute these.  Will therefore treat him with a short course of prednisone for his allergic rhinitis symptoms to see if the cough will improve.

## 2012-01-29 NOTE — Telephone Encounter (Signed)
lmomtcb x1 for [employer] home # line busy x 3 wcb

## 2012-01-29 NOTE — Telephone Encounter (Signed)
Spoke with [name] in regards to patient and recs per [facility].

## 2012-02-05 ENCOUNTER — Telehealth: Payer: Self-pay | Admitting: Cardiovascular Disease

## 2012-02-05 NOTE — Telephone Encounter (Signed)
Dtr says pt has been c/o pain in left chest and left arm, intermittently, occurred twice last week.  Has been going on 2 months.  SOB no worse than usual. C/O "feeling bad" throughout day, which dtr says is not like him.   He has hx CAD/MI; c/o chest pressure at time of MI, not having chest pressure at this time. Also a COPD pt, so dtr says hard to tell difference.

## 2012-02-05 NOTE — Telephone Encounter (Signed)
Pt daughter calling states that pt has been complaining of chest pain and arm pain over last few weeks. Wants to know if something that pt needs to be see for

## 2012-02-05 NOTE — Telephone Encounter (Signed)
Worked pt in with Dr. Mariah Milling tomm at 2:45 pm. Dtr verb. Understanding. Will call 911 should symptoms get worse overnight.

## 2012-02-06 ENCOUNTER — Other Ambulatory Visit: Payer: Self-pay | Admitting: General Practice

## 2012-02-06 ENCOUNTER — Other Ambulatory Visit: Payer: Self-pay | Admitting: Internal Medicine

## 2012-02-06 ENCOUNTER — Encounter: Payer: Self-pay | Admitting: Cardiovascular Disease

## 2012-02-06 ENCOUNTER — Ambulatory Visit (INDEPENDENT_AMBULATORY_CARE_PROVIDER_SITE_OTHER): Payer: Medicare Other | Admitting: Cardiovascular Disease

## 2012-02-06 VITALS — BP 116/68 | HR 61 | Ht 68.0 in | Wt 171.2 lb

## 2012-02-06 DIAGNOSIS — E785 Hyperlipidemia, unspecified: Secondary | ICD-10-CM

## 2012-02-06 DIAGNOSIS — I2510 Atherosclerotic heart disease of native coronary artery without angina pectoris: Secondary | ICD-10-CM

## 2012-02-06 DIAGNOSIS — N400 Enlarged prostate without lower urinary tract symptoms: Secondary | ICD-10-CM

## 2012-02-06 DIAGNOSIS — I509 Heart failure, unspecified: Secondary | ICD-10-CM

## 2012-02-06 DIAGNOSIS — I10 Essential (primary) hypertension: Secondary | ICD-10-CM

## 2012-02-06 DIAGNOSIS — R079 Chest pain, unspecified: Secondary | ICD-10-CM

## 2012-02-06 DIAGNOSIS — J438 Other emphysema: Secondary | ICD-10-CM

## 2012-02-06 MED ORDER — NITROGLYCERIN 0.4 MG SL SUBL: 0.4000 mg | SUBLINGUAL_TABLET | SUBLINGUAL | Status: AC | PRN

## 2012-02-06 MED ORDER — LOSARTAN POTASSIUM 50 MG PO TABS: 50.0000 mg | ORAL_TABLET | Freq: Every day | ORAL | Status: DC

## 2012-02-06 NOTE — Assessment & Plan Note (Signed)
Cholesterol is at goal on the current lipid regimen. No changes to the medications were made.  

## 2012-02-06 NOTE — Patient Instructions (Addendum)
You are doing well. No medication changes were made.  Please call us if you have new issues that need to be addressed before your next appt.  Your physician wants you to follow-up in: 6 months.  You will receive a reminder letter in the mail two months in advance. If you don't receive a letter, please call our office to schedule the follow-up appointment.   

## 2012-02-06 NOTE — Assessment & Plan Note (Signed)
His daughter reports he has significant problems with hesitancy, dribbling, nocturia. I suggested he talk with Dr. Arthur Holms but he would be a candidate for Flomax/Terozosin.

## 2012-02-06 NOTE — Progress Notes (Signed)
Patient ID: [name], male    DOB: 02/08/1925, 78 y.o.   MRN: [id number]  HPI Comments:  78 year old male with a history of dementia, coronary artery disease status post stenting of the third marginal with a nondrug-eluting stent in September 2005, complicated by stent thrombosis after discontinuation of his Plavix resulting in acute inferior and posterior myocardial infarction followed by repeat stenting with DES, long h/o COPD with h/o  COPD exacerbation episodes,  HTN, Diabetes,  hyperlipidemia, bradycardia requiring discontinuation of his beta-blocker, bilateral hip replacement, and obstructive sleep apnea with noncompliance to CPAP, Who presents for routine followup.  He reports that overall he is feeling well. On his last clinic visit, he had stopped Lasix. Weight was stable. Since that time, he has had several episodes of shortness of breath, some evaluation in the hospital for similar symptoms and treated with diuresis . Currently he takes Lasix with potassium daily and reports stable weight, no significant shortness of breath . Goal weight has been in the 168-171 pounds. He does report significant BPH type symptoms. Continued postnasal drip.  He has been using a nebulizer machine at home. He did not find much benefit from other inhalers and stopped taking them. He does have albuterol inhaler .  Blood pressure at home is well controlled. He is trying to manage his diet for better diabetes control.    EKG shows normal sinus rhythm with rate 61 beats per minute, no significant ST or T wave changes.     Outpatient Encounter Prescriptions as of 02/06/2012  Medication Sig Dispense Refill  . acetaminophen (TYLENOL) 500 MG tablet Take 500 mg by mouth every 6 (six) hours as needed. Pain      . albuterol (PROVENTIL HFA;VENTOLIN HFA) 108 (90 BASE) MCG/ACT inhaler Inhale 2 puffs into the lungs every 4 (four) hours as needed. For wheezing/shortness of breath      . albuterol (PROVENTIL) (2.5  MG/3ML) 0.083% nebulizer solution Take 2.5 mg by nebulization every 4 (four) hours as needed. Shortness of breath      . aspirin EC 81 MG EC tablet Take 162 mg by mouth daily.       . benzonatate (TESSALON) 100 MG capsule Take 1 capsule (100 mg total) by mouth every 6 (six) hours as needed for cough.  30 capsule  1  . budesonide (PULMICORT) 0.5 MG/2ML nebulizer solution Take 0.5 mg by nebulization 2 (two) times daily.      . cholecalciferol (VITAMIN D) 1000 UNITS tablet Take 1,000 Units by mouth daily.        . clopidogrel (PLAVIX) 75 MG tablet TAKE 1 TABLET EVERY DAY  30 tablet  1  . donepezil (ARICEPT) 10 MG tablet Take 10 mg by mouth at bedtime.      . furosemide (LASIX) 20 MG tablet TAKE 1 TABLET EVERY DAY  90 tablet  1  . glimepiride (AMARYL) 4 MG tablet Take 4 mg by mouth daily before breakfast.      . HYDROcodone-homatropine (HYCODAN) 5-1.5 MG/5ML syrup Take 5 mLs by mouth every 6 (six) hours as needed. For cough      . ipratropium-albuterol (DUONEB) 0.5-2.5 (3) MG/3ML SOLN Take 3 mLs by nebulization 4 (four) times daily.      Marland Kitchen LORazepam (ATIVAN) 0.5 MG tablet Take 0.5 mg by mouth as needed. Anxiety      . losartan (COZAAR) 50 MG tablet TAKE 1 TABLET BY MOUTH EVERY DAY  90 tablet  4  . memantine (NAMENDA) 10 MG  tablet Take 10 mg by mouth 2 (two) times daily.      . Multiple Vitamin (MULTIVITAMIN WITH MINERALS) TABS Take 1 tablet by mouth daily.      . mupirocin nasal ointment (BACTROBAN) 2 % Place into the nose 3 (three) times daily as needed. Apply a little on a q-tip to lesion in left nostril 3 times a day.      . potassium chloride SA (K-DUR,KLOR-CON) 20 MEQ tablet Take 20 mEq by mouth daily.       . pseudoephedrine-guaifenesin (MUCINEX D) 60-600 MG per tablet Take 1 tablet by mouth every 12 (twelve) hours.      . sertraline (ZOLOFT) 50 MG tablet Take 50 mg by mouth at bedtime.      . simvastatin (ZOCOR) 80 MG tablet Take 40 mg by mouth at bedtime.      . nitroGLYCERIN (NITROSTAT) 0.4  MG SL tablet Place 1 tablet (0.4 mg total) under the tongue every 5 (five) minutes as needed for chest pain.  25 tablet  3   Review of Systems  Constitutional: Negative.   HENT: Negative.   Eyes: Negative.   Respiratory: Positive for shortness of breath.   Cardiovascular: Negative.   Gastrointestinal: Negative.   Musculoskeletal: Negative.   Skin: Negative.   Neurological: Negative.   Hematological: Negative.   Psychiatric/Behavioral: Negative.   All other systems reviewed and are negative.    BP 116/68  Pulse 61  Ht 5\' 8"  (1.727 m)  Wt 171 lb 4 oz (77.678 kg)  BMI 26.04 kg/m2  Physical Exam  Nursing note and vitals reviewed. Constitutional: He is oriented to person, place, and time. He appears well-developed and well-nourished.       On oxygen  HENT:  Head: Normocephalic.  Nose: Nose normal.  Mouth/Throat: Oropharynx is clear and moist.  Eyes: Conjunctivae normal are normal. Pupils are equal, round, and reactive to light.  Neck: Normal range of motion. Neck supple. No JVD present.  Cardiovascular: Normal rate, regular rhythm, S1 normal, S2 normal, normal heart sounds and intact distal pulses.  Exam reveals no gallop and no friction rub.   No murmur heard. Pulmonary/Chest: Effort normal. No respiratory distress. He has decreased breath sounds. He has no wheezes. He has no rales. He exhibits no tenderness.  Abdominal: Soft. Bowel sounds are normal. He exhibits no distension. There is no tenderness.  Musculoskeletal: Normal range of motion. He exhibits no edema and no tenderness.  Lymphadenopathy:    He has no cervical adenopathy.  Neurological: He is alert and oriented to person, place, and time. Coordination normal.  Skin: Skin is warm and dry. No rash noted. No erythema.  Psychiatric: He has a normal mood and affect. His behavior is normal. Judgment and thought content normal.           Assessment and Plan

## 2012-02-06 NOTE — Assessment & Plan Note (Signed)
His daughter reports he has vague left-sided chest pain and left arm symptoms at rest. No significant complaints of symptoms with exertion. He denies any active or recent symptoms though he is a poor historian. We will prescribe nitroglycerin sublingual for him to take as needed for any worsening symptoms of chest pain. We have asked his daughter to call us if he has worsening symptoms. We would consider a pharmacologic Myoview rather than cardiac catheterization.

## 2012-02-06 NOTE — Assessment & Plan Note (Signed)
Significant COPD, stable on oxygen. Improved breathing and fluid status on Lasix daily with potassium. Likely has underlying pulmonary hypertension.

## 2012-02-08 ENCOUNTER — Other Ambulatory Visit: Payer: Self-pay | Admitting: Internal Medicine

## 2012-02-10 ENCOUNTER — Other Ambulatory Visit: Payer: Self-pay | Admitting: Cardiovascular Disease

## 2012-02-10 NOTE — Telephone Encounter (Signed)
Refilled Klor-Con 

## 2012-03-14 ENCOUNTER — Other Ambulatory Visit: Payer: Self-pay | Admitting: Cardiovascular Disease

## 2012-03-14 ENCOUNTER — Ambulatory Visit: Payer: Self-pay | Admitting: Internal Medicine

## 2012-03-16 ENCOUNTER — Other Ambulatory Visit: Payer: Self-pay | Admitting: Internal Medicine

## 2012-03-16 ENCOUNTER — Other Ambulatory Visit: Payer: Self-pay | Admitting: Cardiovascular Disease

## 2012-03-26 ENCOUNTER — Telehealth: Payer: Self-pay | Admitting: Pulmonary Disease

## 2012-03-26 ENCOUNTER — Encounter: Payer: Self-pay | Admitting: Pulmonary Disease

## 2012-03-26 ENCOUNTER — Ambulatory Visit (INDEPENDENT_AMBULATORY_CARE_PROVIDER_SITE_OTHER): Payer: Medicare Other | Admitting: Pulmonary Disease

## 2012-03-26 ENCOUNTER — Other Ambulatory Visit: Payer: Self-pay | Admitting: Pulmonary Disease

## 2012-03-26 VITALS — BP 112/60 | HR 67 | Temp 97.6°F | Ht 68.0 in | Wt 182.8 lb

## 2012-03-26 DIAGNOSIS — R05 Cough: Secondary | ICD-10-CM

## 2012-03-26 DIAGNOSIS — J9610 Chronic respiratory failure, unspecified whether with hypoxia or hypercapnia: Secondary | ICD-10-CM

## 2012-03-26 DIAGNOSIS — R059 Cough, unspecified: Secondary | ICD-10-CM

## 2012-03-26 DIAGNOSIS — J438 Other emphysema: Secondary | ICD-10-CM

## 2012-03-26 MED ORDER — BENZONATATE 100 MG PO CAPS: 100.0000 mg | ORAL_CAPSULE | Freq: Four times a day (QID) | ORAL | Status: AC | PRN

## 2012-03-26 NOTE — Patient Instructions (Addendum)
Will try on dymista nasal spray 2 sprays each nostril each am.   This hopefully will help postnasal drip and cough.  We can call in prescription for this if it helps.   No change in your breathing medication. Keep already scheduled followup apptm with me, but call if your cough is not getting better or if breathing worsens.

## 2012-03-26 NOTE — Progress Notes (Signed)
  Subjective:    Patient ID: [name], male    DOB: 1924/09/26, 78 y.o.   MRN: [id number]  HPI The patient comes in today for worsening cough.  He has known COPD with chronic respiratory failure, and has had a long-standing cough that has sounded upper airway and related to continual postnasal drip.  He has not seen a big difference with nasal steroids in the past, and is unable to take antihistamines over-the-counter because of sedation issues.  The patient also has underlying dementia, but he and his daughter deny any significant swallowing issues at this time.  He is noting increased congestion in his throat, but denies chest congestion that suggest an acute bronchitis.  His mucus is primarily white to pale yellow, and has not seen purulence.  He does not feel his shortness of breath is significantly changed from baseline.   Review of Systems  Constitutional: Negative for fever and unexpected weight change.  HENT: Positive for congestion, rhinorrhea and postnasal drip. Negative for ear pain, nosebleeds, sore throat, sneezing, trouble swallowing, dental problem and sinus pressure.   Eyes: Negative for redness and itching.  Respiratory: Positive for cough, shortness of breath and wheezing. Negative for chest tightness.   Cardiovascular: Negative for palpitations and leg swelling.  Gastrointestinal: Negative for nausea and vomiting.  Genitourinary: Negative for dysuria.  Musculoskeletal: Negative for joint swelling.  Skin: Negative for rash.  Neurological: Negative for headaches.  Hematological: Does not bruise/bleed easily.  Psychiatric/Behavioral: Negative for dysphoric mood. The patient is not nervous/anxious.        Objective:   Physical Exam Well-developed male in no acute distress Nose without purulence or discharge noted Oropharynx clear Chest with mild decrease in breath sounds, no true wheezing, prominent upper airway pseudo-wheezing. Cardiac exam with regular rate and  rhythm Lower extremities with mild edema, no cyanosis Alert and oriented, moves all 4 extremities.       Assessment & Plan:

## 2012-03-26 NOTE — Assessment & Plan Note (Signed)
The patient does not have any increased work of breathing, nor true bronchospasm on exam.  He does not feel that he is developing a chest infection.  He is to continue on his current bronchodilator regimen, as well as oxygen.  He is to call us if he has increasing shortness of breath or symptoms that suggest a new pulmonary infectious process.

## 2012-03-26 NOTE — Telephone Encounter (Signed)
Refill has been sent to the pharmacy for the pt. i called and spoke with pts wife and she is aware.

## 2012-03-26 NOTE — Assessment & Plan Note (Signed)
The patient has a chronic upper airway cough with persistent symptoms of postnasal drip.  I suspect this is the cause of his cough, but we also need to keep in mind possible silent aspiration given his increased dementia symptoms.  I have thought all along that he has a component of allergic and nonallergic rhinitis, and would like to try him on dymista.

## 2012-03-28 LAB — URINALYSIS, COMPLETE
Ph: 5 (ref 4.5–8.0)
Protein: NEGATIVE

## 2012-03-28 LAB — COMPREHENSIVE METABOLIC PANEL
Albumin: 3.8 g/dL (ref 3.4–5.0)
Alkaline Phosphatase: 91 U/L (ref 50–136)
BUN: 19 mg/dL — ABNORMAL HIGH (ref 7–18)
Bilirubin,Total: 0.3 mg/dL (ref 0.2–1.0)
Creatinine: 1.11 mg/dL (ref 0.60–1.30)
Glucose: 163 mg/dL — ABNORMAL HIGH (ref 65–99)
Osmolality: 289 (ref 275–301)
Sodium: 142 mmol/L (ref 136–145)
Total Protein: 7.4 g/dL (ref 6.4–8.2)

## 2012-03-28 LAB — CBC
MCV: 94 fL (ref 80–100)
Platelet: 169 10*3/uL (ref 150–440)
RBC: 4.51 10*6/uL (ref 4.40–5.90)
WBC: 9.8 10*3/uL (ref 3.8–10.6)

## 2012-03-29 ENCOUNTER — Inpatient Hospital Stay: Payer: Self-pay | Admitting: Student

## 2012-03-29 MED ORDER — ALBUTEROL SULFATE HFA 108 (90 BASE) MCG/ACT IN AERS: 2.0000 | INHALATION_SPRAY | RESPIRATORY_TRACT | Status: AC | PRN

## 2012-03-29 NOTE — Telephone Encounter (Signed)
RX sent

## 2012-03-30 LAB — CBC WITH DIFFERENTIAL/PLATELET
Basophil %: 0 %
HCT: 40.4 % (ref 40.0–52.0)
HGB: 13.7 g/dL (ref 13.0–18.0)
Lymphocyte #: 0.6 10*3/uL — ABNORMAL LOW (ref 1.0–3.6)
Lymphocyte %: 4 %
MCV: 94 fL (ref 80–100)
Monocyte %: 2.5 %
Neutrophil #: 14 10*3/uL — ABNORMAL HIGH (ref 1.4–6.5)
RBC: 4.32 10*6/uL — ABNORMAL LOW (ref 4.40–5.90)
RDW: 13.9 % (ref 11.5–14.5)
WBC: 14.9 10*3/uL — ABNORMAL HIGH (ref 3.8–10.6)

## 2012-03-30 LAB — BASIC METABOLIC PANEL
BUN: 23 mg/dL — ABNORMAL HIGH (ref 7–18)
Calcium, Total: 9.4 mg/dL (ref 8.5–10.1)
EGFR (African American): >60
Glucose: 279 mg/dL — ABNORMAL HIGH (ref 65–99)
Osmolality: 288 (ref 275–301)
Potassium: 4.1 mmol/L (ref 3.5–5.1)
Sodium: 137 mmol/L (ref 136–145)

## 2012-03-31 ENCOUNTER — Ambulatory Visit: Payer: Medicare Other | Admitting: Internal Medicine

## 2012-03-31 LAB — CBC WITH DIFFERENTIAL/PLATELET
Basophil #: 0 10*3/uL (ref 0.0–0.1)
Basophil %: 0.1 %
Eosinophil #: 0 10*3/uL (ref 0.0–0.7)
HCT: 39.5 % — ABNORMAL LOW (ref 40.0–52.0)
HGB: 13 g/dL (ref 13.0–18.0)
Lymphocyte #: 0.6 10*3/uL — ABNORMAL LOW (ref 1.0–3.6)
MCH: 30.8 pg (ref 26.0–34.0)
MCHC: 32.9 g/dL (ref 32.0–36.0)
MCV: 94 fL (ref 80–100)
Neutrophil #: 15.4 10*3/uL — ABNORMAL HIGH (ref 1.4–6.5)
RDW: 14.1 % (ref 11.5–14.5)

## 2012-03-31 LAB — BASIC METABOLIC PANEL
Anion Gap: 7 (ref 7–16)
BUN: 33 mg/dL — ABNORMAL HIGH (ref 7–18)
Chloride: 103 mmol/L (ref 98–107)
Co2: 28 mmol/L (ref 21–32)
Creatinine: 1.23 mg/dL (ref 0.60–1.30)
Potassium: 4.3 mmol/L (ref 3.5–5.1)
Sodium: 138 mmol/L (ref 136–145)

## 2012-04-08 ENCOUNTER — Ambulatory Visit: Payer: Medicare Other | Admitting: Internal Medicine

## 2012-04-14 ENCOUNTER — Ambulatory Visit: Payer: Self-pay | Admitting: Internal Medicine

## 2012-05-15 DEATH — deceased

## 2012-06-02 ENCOUNTER — Ambulatory Visit: Payer: Medicare Other | Admitting: Pulmonary Disease

## 2012-08-11 ENCOUNTER — Ambulatory Visit: Payer: Medicare Other | Admitting: Cardiovascular Disease

## 2014-01-23 IMAGING — CR DG CHEST 2V
2 series · 2 of 2 positions shown · non-contrast
Comparison: 06/26/2011.

CLINICAL DATA: History of emphysema.  History of increasing
coughing.  History of increasing shortness of breath.  Wheezing.

CHEST - 2 VIEW

[view not recorded (1 of 2)]
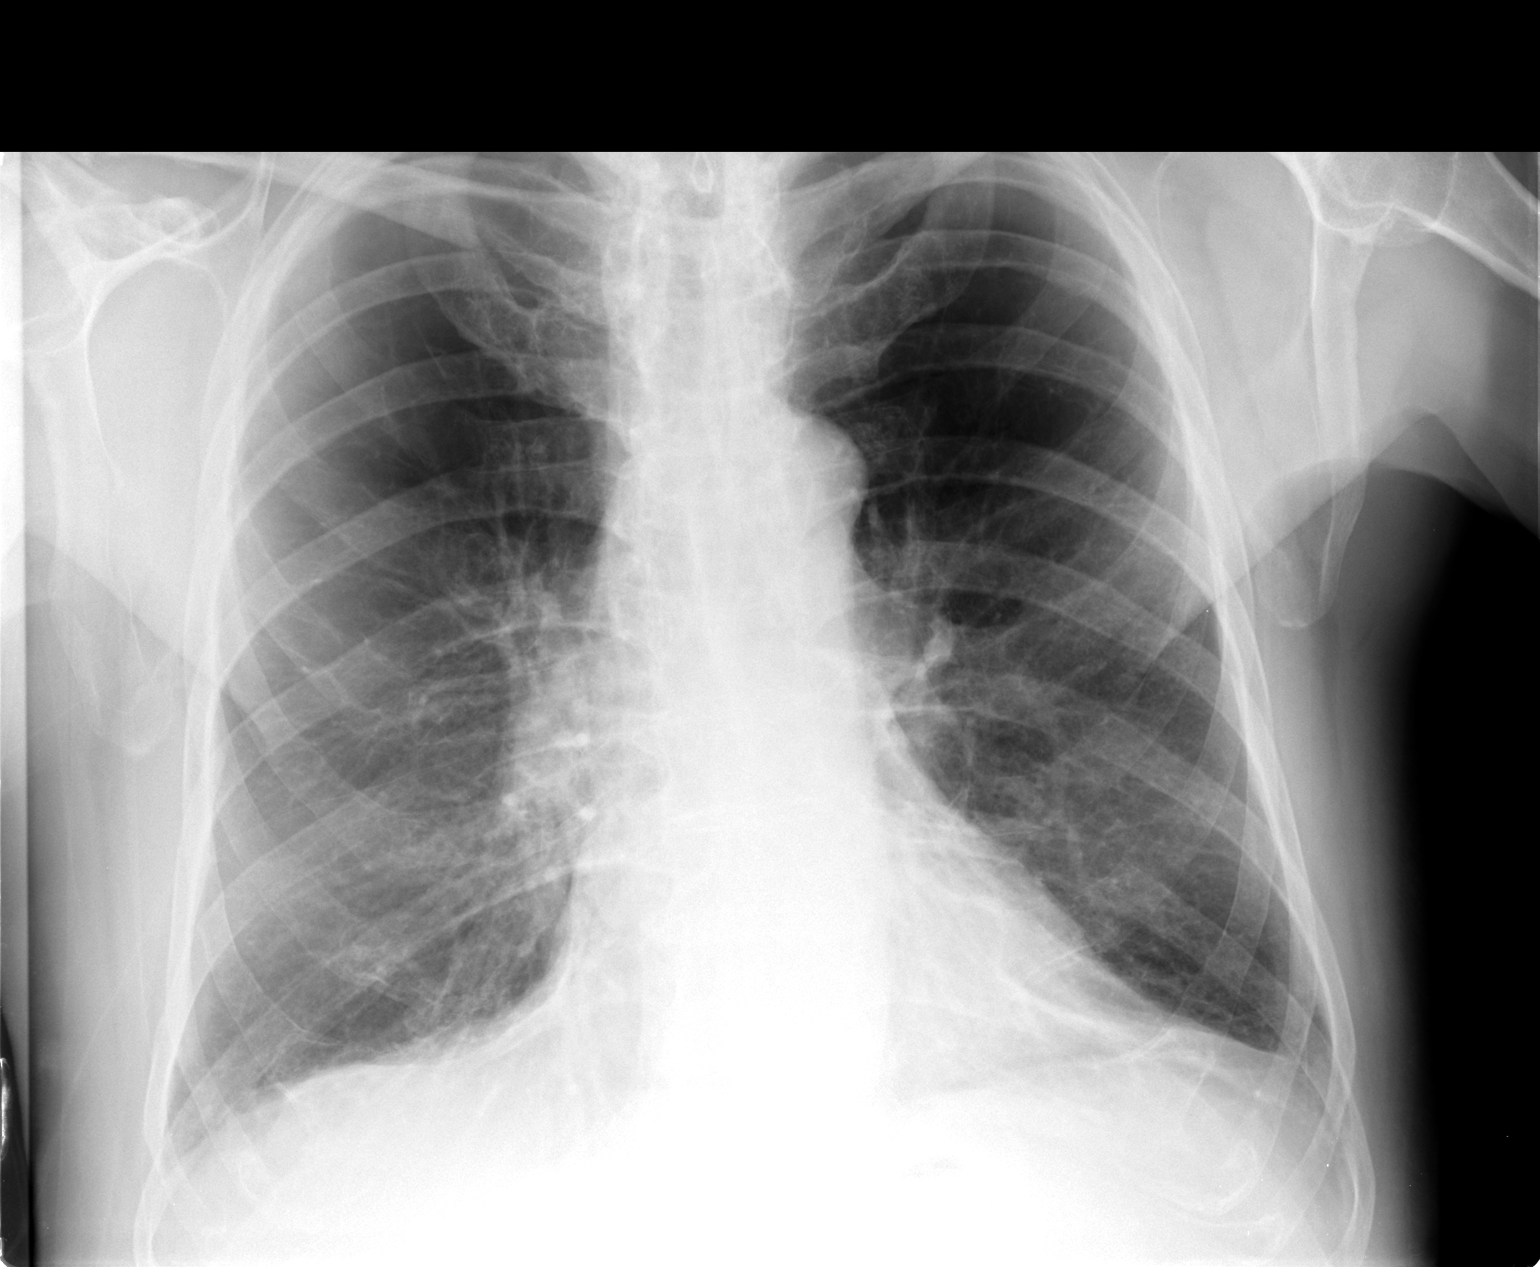

[view not recorded (2 of 2)]
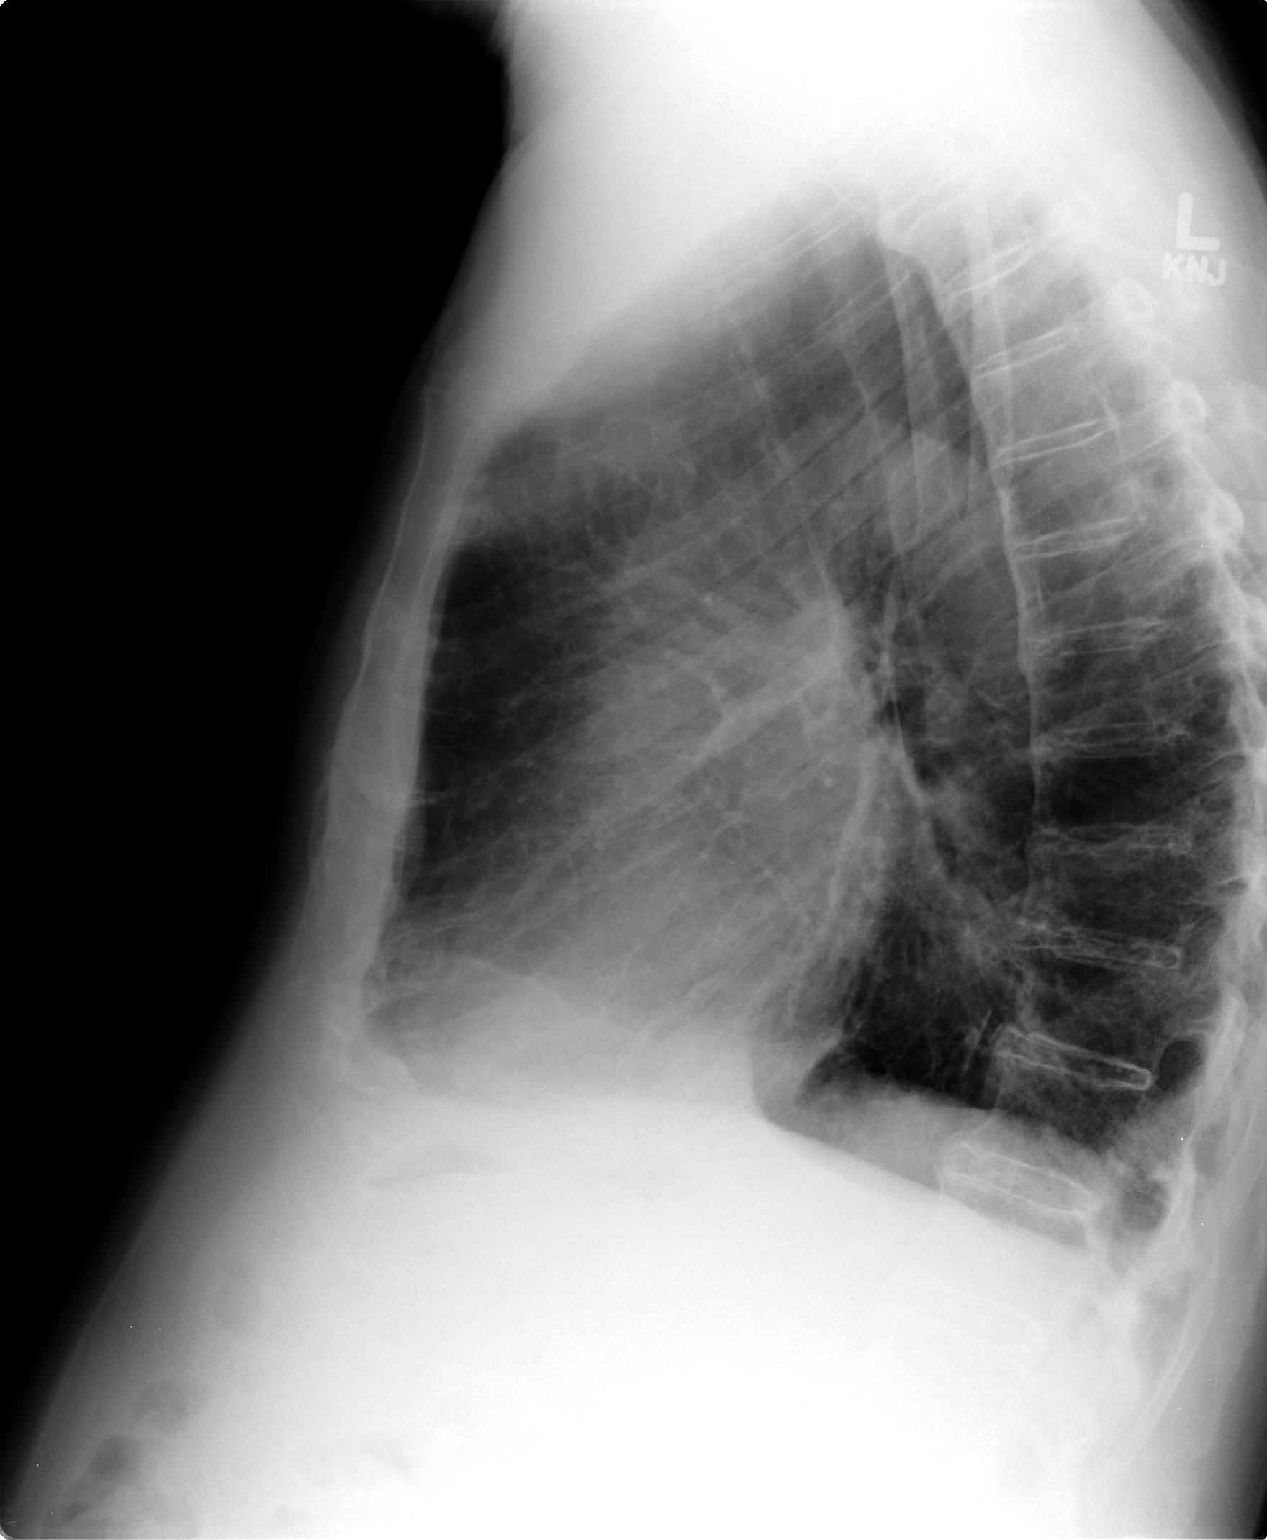

[2 of 2 positions shown; findings below may reference images not displayed]

FINDINGS: Cardiac silhouette is stable in the upper range of normal
size.  Ectasia of thoracic aorta is present. There is hilar
prominence which is felt to be vascular and stable.  There is
flattening of the diaphragm on lateral image with generalized
hyperinflation configuration.  No pulmonary edema, pneumonia, or
pleural effusion is seen.  There is osteopenic appearance of the
bones.  There is osteophyte and syndesmophyte formation in the
spine.
IMPRESSION: Hyperinflation configuration consistent with COPD.  No acute
superimposed process.  No pulmonary edema, or pneumonia is evident.
Stable chronic findings are detailed above.

## 2014-08-01 NOTE — Consult Note (Signed)
    Comments   [name] asked to speak with me again. She is conflicted about sending pt to the Hospice Home. She does not seem to have a clear understanding of the philosophy of the Hospice Home and is concerned that pt will not be getting his chronic meds. She is to meet with Erroll LunaGinny Ward, hospice liason, in AM and asks to meet with me at that time as well. Will follow up.   Electronic Signatures: Jonothan Heberle, Harriett SineNancy (MD)  (Signed 18-Dec-13 15:57)  Authored: Palliative Care   Last Updated: 18-Dec-13 15:57 by Theoplis Garciagarcia, Harriett SineNancy (MD)

## 2014-08-01 NOTE — Discharge Summary (Signed)
PATIENT NAMGretta Began:  [name], [name] MR#:  [id number] DATE OF BIRTH:  Aug 17, 1924  DATE OF ADMISSION:  03/29/2012 DATE OF DISCHARGE:  04/01/2012  CONSULTANTS: Dr. Meredeth IdeFleming from pulmonary, Dr. Harvie JuniorPhifer from palliative care.   CHIEF COMPLAINT: Shortness of breath.   DISCHARGE DIAGNOSES: 1. Acute and chronic respiratory failure in the setting of chronic obstructive pulmonary disease exacerbation.  2. Dementia.  3. Coronary artery disease.   4. History of chronic obstructive pulmonary disease.  5. Diabetes.  6. Hyperlipidemia.   DISPOSITION: The patient will be getting discharged to a hospice home on 3 liters of oxygen.   CODE STATUS: The patient is DO NOT RESUSCITATE.   DIET: As tolerated.   ACTIVITY: As tolerated.   DISCHARGE INSTRUCTIONS: Leave Foley catheter for urinary incontinence.   MEDICATIONS:   1. Roxanol 0.25 to 0.5 mL p.o./SL every 1 to 2 hours p.r.n. for pain, dyspnea until relief.  2. Lorazepam 0.5 mg to 1 mg p.o./SL every 2 to 4 hours p.r.n. for agitation, anxiety.  3. Ranitidine 150 mg p.o. b.i.d.  4. Lasix 20 mg once a day. 5. Proventil HFA 90 mcg inhaled 2 puffs every 4 hours as needed for wheezing or shortness of breath.  6. Budesonide 0.5 mg/2 mL inhaled suspension, 1 vial via nebulizer 2 times a day. 7. Albuterol/ipratropium 2.5 mg/0.5 mg/3 mL inhaled solution, 1 vial 4 times a day.  HISTORY OF PRESENT ILLNESS AND BRIEF HOSPITAL COURSE: For full details of the history and physical, please see the dictation on 03/29/2012 by Dr. Randol [name]; but  briefly this is an 81 year old with history of chronic obstructive pulmonary disease, chronic respiratory failure on 3 liters of nasal cannula oxygen at home, hypertension, diabetes, coronary artery disease,  dementia, who came for shortness of breath and chronic obstructive pulmonary disease exacerbation. Note, he recently had a short stay at [facility] for chronic obstructive pulmonary disease exacerbation. He was admitted to the  hospitalist service.   His hospital course was complicated with bouts of severe shortness of breath requiring BiPAP. He did decompensate several times, and as he was DO NOT RESUSCITATE BiPAP was initiated. He was started on broad-spectrum antibiotics, IV steroids and nebulizers. Pulmonary was consulted as well. Palliative Care was consulted as well. At this point he has had some improvement, but given the multiple comorbidities and dementia the family decided to take the patient to a hospice home, and he will be discharged with the above medications. He is at risk of severe potential decompensations.   CODE STATUS: He is DO NOT RESUSCITATE AND DO NOT INTUBATE.  TOTAL TIME SPENT:  35 minutes.  ____________________________ Dennis EatonShayiq Porschia Willbanks, MD sa:cb D: 04/01/2012 10:08:43 ET T: 04/01/2012 10:17:21 ET JOB#: [id number]  cc: Dennis EatonShayiq Reade Trefz, MD, <Dictator> Dennis EatonSHAYIQ Alaira Level MD ELECTRONICALLY SIGNED 04/13/2012 14:10

## 2014-08-01 NOTE — Consult Note (Signed)
    Comments   I spoke with pt's daughter, Stana Buntingrene Brisson. We discussed the options of discharge to SNF for STR vs Hospice Home. We both agree that pt cannot go home with his elderly wife as caregiver. We also feel that if pt goes to SNF, he will likely require rehospitalization and is not likely able to participate with PT. Daughter feels that the best thing for pt is comfort care at the Hospice Home. Ginny Ward, RN, hospice liason notified and will see pt.  Dx: end-stage COPD  Secondary dx: dementia, CAD  Electronic Signatures: Fatisha Rabalais, Harriett SineNancy (MD)  (Signed 18-Dec-13 13:07)  Authored: Palliative Care   Last Updated: 18-Dec-13 13:07 by Nicie Milan, Harriett SineNancy (MD)

## 2014-08-01 NOTE — H&P (Signed)
PATIENT NAMGretta Began:  [name], [name] MR#:  [id number] DATE OF BIRTH:  [birth year]  DATE OF ADMISSION:  03/29/2012  REFERRING PHYSICIAN:  Chiquita LothJade Sung, MD.   PRIMARY CARE PHYSICIAN:  Illene RegulusMichael Norins, MD.  CHIEF COMPLAINT:  Shortness of breath with cough,   HISTORY OF PRESENT ILLNESS:  This is an 81 year old male with known history of COPD on home oxygen 3 liters nasal cannula, hypertension, diabetes mellitus, coronary artery disease and hyperlipidemia who presents with complaint of shortness of breath and cough with productive yellow sputum and wheezing for the last four days. The patient has advanced dementia, so most of the history obtained from son at bedside, who refers his father having a history of COPD on home oxygen 3 liters nasal cannula. Lives at home with his wife. Son helps take care of his parents.  He reports his father has been complaining of shortness of breath with a cough, productive of yellow sputum over the last 4 days. He has been using his inhaler more frequently without much help, so that prompted him to bring him to the ED. In the ED the patient had significant wheezing and decreased air entry. He received IV Solu-Medrol and nebulizer treatment which helped his symptoms. The hospitalist service was requested to admit the patient for further management and treatment of his COPD exacerbation. The patient had a chest x-ray done which did not show any evidence of infiltrate.  The son he reports that his father was admitted last time earlier this year at [facility] for COPD exacerbation.   PAST MEDICAL HISTORY: 1.  Dementia.  2.  COPD on home oxygen 3 liters nasal cannula.  3.  Chronic respiratory failure on home oxygen.  4.  Coronary artery disease.  5.  COPD.  6.  Dementia.  7.  Hyperlipidemia.  8.  Diabetes mellitus.  9.  Hypertension.   PAST SURGICAL HISTORY: 1.  Cholecystectomy.  2,  Total hip arthroplasty.  3.  Hernia repair.   FAMILY HISTORY:  Significant for diabetes,  hypertension, and hyperlipidemia.   SOCIAL HISTORY:  The patient is married. Lives at home with his wife. Has a nursing aide visiting every day in the a.m.  Former smoker, used to smoke 1 pack a day for 25 years. No alcohol or history of drug abuse.   ALLERGIES:   1.  AMOXICILLIN. 2.  CLARITHROMYCIN. 3.  PENICILLIN. 4.  LATEX.  HOME MEDICATIONS: 1.  Simvastatin 80 mg at bedtime.  2.  Sertraline 50 mg daily.  3.  Proventil inhalation, inhale 2 puffs every 4 hours as needed.  4.  Potassium 20 mEq oral daily.  5.  Oxygen 3 liters per minute.  6.  Nitroglycerin 0.5 mg sublingual as needed.  7.  Namenda 10 mg oral daily.  8.  Multivitamin 1 tablet daily.  9.  Losartan 50 mg daily.  10.  Lorazepam 0.5 mg 3 times a day as needed.  11.  Glyburide 4 mg oral daily.  12.  Lasix 20 mg oral daily.  13.  Benazepril 20 mg oral daily. 14.  Plavix 75 mg daily.  15.  Cholecalciferol 1000 International Units daily.  16.  Budesonide inhalation daily.  17.  Benzonatate 100 mg oral every 6 hours as needed for cough.  18.  Aspirin 160 mg oral daily.  19.  Duo Nebs every 4 hours as needed.  20.  Albuterol every 6 hours as needed.  21.  Tylenol 400 mg every 6 hours as needed.   REVIEW  OF SYSTEMS: The patient is having dementia, cannot give a very reliable review of systems but he denies fever, chills. Complains of fatigue and weakness. EYES: Denies blurry vision, double vision or pain. ENT:  Denies tinnitus, ear pain, hearing loss, epistaxis or discharge. RESPIRATORY:  He initially denies cough, which once reminded by his son, the son reports he has been having cough, dyspnea and productive sputum, yellow in color and significant wheezing. CARDIOVASCULAR: Denies any chest pain, edema, arrhythmia, palpitations, lightheadedness. GASTROINTESTINAL: Denies nausea, vomiting, diarrhea, abdominal pain, hematemesis. GENITOURINARY:  Denies dysuria, hematuria, or renal colic. ENDOCRINE:  Denies polyuria,  polydipsia, heat or cold intolerance. HEMATOLOGY:  Denies anemia, easy bruising, bleeding diathesis. INTEGUMENT:  Denies acne, rash or skin lesions.  MUSCULOSKELETAL: Denies any gout, redness arthritis or cramps. NEUROLOGIC:  Denies any vertigo, ataxia, headache or migraine.  He has a history of dementia. PSYCH:  No history of alcohol or drug abuse.  Denies any insomnia. Has history of anxiety and depression.   PHYSICAL EXAMINATION: VITAL SIGNS: Temperature 97.7, pulse 72, respiratory rate 22, blood pressure 136/59, saturating 95% on 3 liters nasal cannula.  GENERAL: Well-nourished elderly male, who is comfortable in bed in no apparent distress.  HEENT: Head atraumatic, normocephalic. Pupils equal and reactive to light. Pink conjunctivae. Anicteric sclerae. Moist oral mucosa.  NECK: Supple. No thyromegaly. No JVD.  CHEST: Decreased air entry with diffuse wheezing. No rales or rhonchi.  CARDIOVASCULAR: S1, S2 heard. No rubs, murmur or gallops.  ABDOMEN: Soft, nontender, nondistended. Bowel sounds present.  EXTREMITIES: No edema. No clubbing. No cyanosis.  PSYCHIATRIC: Pleasant appropriate affect. Awake, alert x 2.  NEUROLOGIC: Cranial nerves grossly intact. Motor 5 out of 5. No significant deficits.  SKIN: Warm and dry. Normal skin turgor.   PERTINENT LABORATORY DATA:  Glucose 163, BNP 508, BUN 19, creatinine 1.11, sodium 142, potassium 4.1, chloride 105, CO2 33. White blood cells 9.8, hemoglobin 13.8, hematocrit 42.5, platelets 169. Urinalysis negative for nitrite and leukocyte esterase.  Chest x-ray does not show any infiltrate.   ASSESSMENT AND PLAN: This is an 81 year old male with known history of chronic obstructive pulmonary disease at home oxygen 3 liters nasal cannula who presents with shortness of breath, cough with productive sputum, wheezing.   1.  Chronic obstructive pulmonary disease exacerbation. The patient is known to have a history of chronic respiratory failure on home  oxygen 3 liters nasal cannula. Will be started on intravenous Solu-Medrol 80 every 8 hours. Will be started on Levaquin as he is having productive cough with yellow sputum. We will check sputum cultures and will have him on Duo Nebs every 4 hours, albuterol every 2 hours. We will continue him on his home budesonide.  We will continue him on 3 liters nasal cannula and will have him on Protonix for gastrointestinal prophylaxis as he is on a large dose of steroids.  2.  Dementia. Continue with home medication, Namenda and Donepezil. 3. Coronary artery disease.  The patient has no chest pain. We will continue on aspirin, Plavix, statin, ACE inhibitor and no beta blocker secondary to significant chronic obstructive pulmonary disease.  4.  Hypertension, controlled. Continue home medication.  5.  Diabetes mellitus. Will continue home medication. We will add insulin sliding scale as the patient is on steroids.  6.  Hyperlipidemia. Continue with statin.  7.  Deep vein thrombosis prophylaxis on subcutaneous heparin. Gastrointestinal prophylaxis on Protonix.  8.  CODE STATUS: The patient is DO NOT RESUSCITATE.  Confirmed code status with son  at bedside.   TOTAL TIME SPENT ADMISSION AND PATIENT CARE:  55 minutes    ____________________________ Starleen Arms, MD dse:ct D: 03/29/2012 01:57:00 ET T: 03/29/2012 09:36:35 ET JOB#: [id number]  cc: Starleen Arms, MD, <Dictator> Mykaela Arena Teena Irani MD ELECTRONICALLY SIGNED 04/07/2012 12:21
